# Patient Record
Sex: Female | Born: 2000 | Hispanic: Yes | Marital: Single | Smoking: Never smoker
Health system: Southern US, Community
[De-identification: ages and names within clinical notes are randomized; demographics above are authoritative.]

## PROBLEM LIST (undated history)

## (undated) DIAGNOSIS — F32A Depression, unspecified: Secondary | ICD-10-CM

## (undated) DIAGNOSIS — K602 Anal fissure, unspecified: Secondary | ICD-10-CM

## (undated) DIAGNOSIS — F419 Anxiety disorder, unspecified: Secondary | ICD-10-CM

## (undated) DIAGNOSIS — J45909 Unspecified asthma, uncomplicated: Secondary | ICD-10-CM

## (undated) DIAGNOSIS — F319 Bipolar disorder, unspecified: Secondary | ICD-10-CM

## (undated) HISTORY — DX: Anxiety disorder, unspecified: F41.9

## (undated) HISTORY — DX: Depression, unspecified: F32.A

## (undated) HISTORY — DX: Bipolar disorder, unspecified: F31.9

## (undated) HISTORY — DX: Anal fissure, unspecified: K60.2

## (undated) HISTORY — PX: COLONOSCOPY: SHX174

---

## 2018-06-20 ENCOUNTER — Other Ambulatory Visit: Payer: Self-pay

## 2018-06-20 ENCOUNTER — Emergency Department (HOSPITAL_COMMUNITY)
Admission: EM | Admit: 2018-06-20 | Discharge: 2018-06-20 | Disposition: A | Discharge status: Home / Self Care | Payer: Self-pay | Attending: Emergency Medicine | Admitting: Emergency Medicine

## 2018-06-20 ENCOUNTER — Encounter (HOSPITAL_COMMUNITY): Payer: Self-pay | Admitting: Obstetrics and Gynecology

## 2018-06-20 DIAGNOSIS — N6324 Unspecified lump in the left breast, lower inner quadrant: Secondary | ICD-10-CM | POA: Insufficient documentation

## 2018-06-20 DIAGNOSIS — N63 Unspecified lump in unspecified breast: Secondary | ICD-10-CM

## 2018-06-20 HISTORY — DX: Unspecified asthma, uncomplicated: J45.909

## 2018-06-20 NOTE — Care Management Note (Signed)
Case Management Note  CM consulted for no pcp with need for breast imaging follow up.  CM noted pt has [employer] ([employer]).  [facility] who advised [facility] provider would be acceptable but the order will need to come from them.  CM contacted pt via phone to discuss PCP needs.  She reports she will contact the student health center on campus and have them guide her care.  She had no further questions.  Updated Dr. Anitra Lauth.  No further CM needs noted at this time.  Kathia Covington, Lynnae Sandhoff, RN 06/20/2018, 3:44 PM

## 2018-06-20 NOTE — Discharge Instructions (Signed)
You will need to call the breast center to schedule a ultrasound to better characterize the lump noted on your breast exam.  This may very likely be a breast cyst, as these are very common in young females and can change size with your menstrual cycle.  You may use Tylenol or ibuprofen as needed for pain or warm compresses.  The area does not appear infected today but if her breast becomes red, warm to the touch swollen or you have any nipple drainage or discharge, or fevers please return to the emergency department for reevaluation.

## 2018-06-20 NOTE — ED Provider Notes (Signed)
[facility]-EMERGENCY DEPT Provider Note   CSN: [id number] Arrival date & time: 06/20/18  1409     History   Chief Complaint Chief Complaint  Patient presents with  . Breast Mass    HPI Adriana Jackson is a 17 y.o. female.  Adriana Jackson is a 17 y.o. Female who is otherwise healthy presents to the ED for evaluation of lump in the left breast. Pt reports she noticed this yesterday, [facility] and they recommended breast ultrasound,so pt presents for further evaluation.  She reports she noticed a small beadlike lump just to the left of her nipple, it is sore and somewhat painful to the touch.  She has not noticed any redness warmth or swelling of the breast in general.  No nipple discharge or drainage.  She is not currently pregnant or breast-feeding.  No fevers or chills.  She has not noticed any lumps or bumps elsewhere.  No history of similar.  Is not currently on her menstrual cycle.  She has not taken anything for symptoms prior to arrival, no other aggravating or alleviating factors.     Past Medical History:  Diagnosis Date  . Asthma     There are no active problems to display for this patient.   History reviewed. No pertinent surgical history.   OB History   None      Home Medications    Prior to Admission medications   Not on File    Family History No family history on file.  Social History Social History   Tobacco Use  . Smoking status: Never Smoker  . Smokeless tobacco: Never Used  Substance Use Topics  . Alcohol use: Not Currently  . Drug use: Not Currently     Allergies   Penicillins   Review of Systems Review of Systems  Constitutional: Negative for chills and fever.  Respiratory: Negative for shortness of breath.   Cardiovascular: Negative for chest pain.  Gastrointestinal: Negative for nausea and vomiting.  Skin: Negative for color change, pallor and rash.       Breast lump     Physical  Exam Updated Vital Signs BP (!) 130/96 (BP Location: Left Arm)   Pulse 92   Temp 97.8 F (36.6 C) (Oral)   Resp 16   Ht 5\' 8"  (1.727 m)   Wt 49.3 kg   LMP 06/09/2018   SpO2 100%   BMI 16.51 kg/m   Physical Exam  Constitutional: She appears well-developed and well-nourished. No distress.  HENT:  Head: Normocephalic and atraumatic.  Eyes: Right eye exhibits no discharge. Left eye exhibits no discharge.  Cardiovascular: Normal rate, regular rhythm, normal heart sounds and intact distal pulses.  Pulmonary/Chest: Effort normal and breath sounds normal. No respiratory distress.  Palpable lump noted in the left breast as noted below, no palpable masses, noted in the right breast.  No overlying skin changes.    Neurological: She is alert. Coordination normal.  Skin: She is not diaphoretic.  Psychiatric: She has a normal mood and affect. Her behavior is normal.  Nursing note and vitals reviewed.    ED Treatments / Results  Labs (all labs ordered are listed, but only abnormal results are displayed) Labs Reviewed - No data to display  EKG None  Radiology No results found.  Procedures Procedures (including critical care time)  Medications Ordered in ED Medications - No data to display   Initial Impression / Assessment and Plan / ED Course  I  have reviewed the triage vital signs and the nursing notes.  Pertinent labs & imaging results that were available during my care of the patient were reviewed by me and considered in my medical decision making (see chart for details).  Patient presents to the ED for evaluation of lump she noted in her left breast.  She was seen by student [facility] center who recommended ultrasound.  On exam patient does have a small cystic-like structure noted just to the left of the left nipple, no overlying skin changes.  Exam is noted all consistent with breast abscess.  Patient is not currently breast-feeding.  Patient will be referred to the breast  imaging center for ultrasound.  Discussed appropriate symptomatic management.  Patient expresses understanding and is in agreement with plan.  Stable for discharge home at this time.  Final Clinical Impressions(s) / ED Diagnoses   Final diagnoses:  Breast lump in female    ED Discharge Orders         Ordered    Ambulatory referral to Breast Clinic     06/20/18 1532           Legrand Rams 06/20/18 1619    Gwyneth Sprout, MD 06/20/18 2128

## 2018-06-20 NOTE — ED Triage Notes (Signed)
Pt reports she has an area of concern in her left breast that her student health center advised her to get ultrasounded.

## 2018-06-21 ENCOUNTER — Ambulatory Visit (INDEPENDENT_AMBULATORY_CARE_PROVIDER_SITE_OTHER): Payer: Self-pay | Admitting: Family Medicine

## 2018-06-21 ENCOUNTER — Encounter (INDEPENDENT_AMBULATORY_CARE_PROVIDER_SITE_OTHER): Payer: Self-pay | Admitting: Family Medicine

## 2018-06-21 VITALS — BP 117/79 | HR 89 | Temp 98.4°F | Ht 68.0 in | Wt 108.6 lb

## 2018-06-21 DIAGNOSIS — N6320 Unspecified lump in the left breast, unspecified quadrant: Secondary | ICD-10-CM

## 2018-06-21 DIAGNOSIS — G43709 Chronic migraine without aura, not intractable, without status migrainosus: Secondary | ICD-10-CM

## 2018-06-21 DIAGNOSIS — Z0000 Encounter for general adult medical examination without abnormal findings: Secondary | ICD-10-CM

## 2018-06-21 NOTE — Progress Notes (Signed)
Office Visit Note   Patient: Adriana Jackson           Date of Birth: [birth year]           MRN: [id number] Visit Date: 06/21/2018 Requested by: No referring provider defined for this encounter. PCP: Patient, No Pcp Per  Subjective: Chief Complaint  Patient presents with  . Annual Exam    HPI: She is here for a wellness exam.  She has not been to a doctor for a while.  Her main reason for the visit is worsening migraines.  Starting around age 17 she began having migraine headaches.  She has a strong family history of migraines.  She did not seek medical treatment for them, when they.  She took over-the-counter medication with good results.  In the last year she has had them 5-6 times per month.  They do not seem to be associated with any foods but they do get worse with weather changes.  No definite hormonal associations.  She tried somebody's triptan medication with good results.  Also in the past week she has had a left breast lump.  She went to the ER yesterday and they told her it did not look infected but ordered an ultrasound.  Apparently her insurance requires a PCP to order this.  Otherwise she has been in good health.               ROS: Energy level slightly diminished.  She gets gastrointestinal cramping and diarrhea when she consumes dairy products.  She has chronic troubles gaining weight.  No rashes on her skin, no hair or nail changes.  Menstrual cycles have been normal.  All other systems were negative.   Objective: Vital Signs: BP 117/79   Pulse 89   Temp 98.4 F (36.9 C)   Ht 5\' 8"  (1.727 m)   Wt 108 lb 9.6 oz (49.3 kg)   LMP 06/09/2018   SpO2 98%   BMI 16.51 kg/m   Physical Exam:  HEENT:  [place]/AT, PERRLA, EOM Full, no nystagmus.  Funduscopic examination within normal limits.  No conjunctival erythema.  Tympanic membranes are pearly gray with normal landmarks.  External ear canals are normal.  Nasal passages are clear.  Oropharynx is clear.  No significant  lymphadenopathy.  Thyroid slightly enlarged with no nodules.  2+ carotid pulses without bruits.  CV: Regular rate and rhythm without murmurs, rubs, or gallops.  No peripheral edema.  2+ radial and posterior tibial pulses.  Lungs: Clear to auscultation throughout with no wheezing or areas of consolidation.  Abd: Bowel sounds are active, no hepatosplenomegaly or masses.  Soft and nontender.    EXT: 2+ DTRs, no nail deformities.  SKIN: No suspicious lesions.  Breasts:  Deferred.    Imaging: None today.  Assessment & Plan: 1.  Wellness examination -We will draw some baseline labs today.  2.  Chronic headaches, probably migraines -Labs to evaluate.  If unremarkable, we will try some supplements and dietary changes for maintenance.  3.  Left breast lump -Ultrasound ordered.   Follow-Up Instructions: No follow-ups on file.       Procedures: None today.   PMFS History: There are no active problems to display for this patient.  Past Medical History:  Diagnosis Date  . Asthma     History reviewed. No pertinent family history.  History reviewed. No pertinent surgical history. Social History   Occupational History  . Not on file  Tobacco Use  . Smoking status: Never  Smoker  . Smokeless tobacco: Never Used  Substance and Sexual Activity  . Alcohol use: Not Currently  . Drug use: Not Currently  . Sexual activity: Not Currently

## 2018-06-22 ENCOUNTER — Telehealth (INDEPENDENT_AMBULATORY_CARE_PROVIDER_SITE_OTHER): Payer: Self-pay | Admitting: Family Medicine

## 2018-06-22 LAB — VITAMIN D 25 HYDROXY (VIT D DEFICIENCY, FRACTURES): Vit D, 25-Hydroxy: 35 ng/mL (ref 30–100)

## 2018-06-22 LAB — CBC WITH DIFFERENTIAL/PLATELET
BASOS PCT: 0.9 %
Basophils Absolute: 104 cells/uL (ref 0–200)
EOS ABS: 336 {cells}/uL (ref 15–500)
Eosinophils Relative: 2.9 %
HCT: 39.4 % (ref 35.0–45.0)
Hemoglobin: 13.5 g/dL (ref 11.7–15.5)
Lymphs Abs: 3051 cells/uL (ref 850–3900)
MCH: 29.9 pg (ref 27.0–33.0)
MCHC: 34.3 g/dL (ref 32.0–36.0)
MCV: 87.4 fL (ref 80.0–100.0)
MONOS PCT: 8.3 %
MPV: 11.8 fL (ref 7.5–12.5)
NEUTROS PCT: 61.6 %
Neutro Abs: 7146 cells/uL (ref 1500–7800)
PLATELETS: 340 10*3/uL (ref 140–400)
RBC: 4.51 10*6/uL (ref 3.80–5.10)
RDW: 12.2 % (ref 11.0–15.0)
TOTAL LYMPHOCYTE: 26.3 %
WBC: 11.6 10*3/uL — AB (ref 3.8–10.8)
WBCMIX: 963 {cells}/uL — AB (ref 200–950)

## 2018-06-22 LAB — LIPID PANEL
CHOLESTEROL: 154 mg/dL (ref <170)
HDL: 50 mg/dL (ref >45)
LDL CHOLESTEROL (CALC): 92 mg/dL (ref <110)
Non-HDL Cholesterol (Calc): 104 mg/dL (calc) (ref <120)
Total CHOL/HDL Ratio: 3.1 (calc) (ref <5.0)
Triglycerides: 42 mg/dL (ref <90)

## 2018-06-22 LAB — COMPREHENSIVE METABOLIC PANEL
AG RATIO: 1.7 (calc) (ref 1.0–2.5)
ALT: 9 U/L (ref 5–32)
AST: 16 U/L (ref 12–32)
Albumin: 5 g/dL (ref 3.6–5.1)
Alkaline phosphatase (APISO): 65 U/L (ref 47–176)
BUN: 11 mg/dL (ref 7–20)
CALCIUM: 10.4 mg/dL (ref 8.9–10.4)
CO2: 27 mmol/L (ref 20–32)
Chloride: 105 mmol/L (ref 98–110)
Creat: 0.67 mg/dL (ref 0.50–1.00)
GLOBULIN: 3 g/dL (ref 2.0–3.8)
GLUCOSE: 68 mg/dL (ref 65–99)
Potassium: 5.5 mmol/L — ABNORMAL HIGH (ref 3.8–5.1)
Sodium: 144 mmol/L (ref 135–146)
Total Bilirubin: 0.6 mg/dL (ref 0.2–1.1)
Total Protein: 8 g/dL (ref 6.3–8.2)

## 2018-06-22 LAB — THYROID PANEL WITH TSH
FREE THYROXINE INDEX: 2.6 (ref 1.4–3.8)
T3 Uptake: 31 % (ref 22–35)
T4, Total: 8.5 ug/dL (ref 5.3–11.7)
TSH: 0.95 mIU/L

## 2018-06-22 LAB — HIGH SENSITIVITY CRP: hs-CRP: <0.3 mg/L

## 2018-06-22 NOTE — Telephone Encounter (Signed)
Labs look good overall.  WBC is slightly elevated, possibly due to breast issue.  Vitamin D is low-normal (ideal is 50-80).  For headache prevention, I recommend trying the following:  Magnesium 400 mg twice daily Coenzyme Q10 100 mg twice daily Riboflavin 100 mg daily Fish Oil 1000 mg of combined DHA/EPA daily Vitamin C 1000 mg daily Vitamin D 2000 IU daily  The above regimen often helps with headache control.  If things don't improve after a month or two, we might try a food elimination diet such as Whole 30 (see their website).  [facility]

## 2018-06-25 ENCOUNTER — Encounter (INDEPENDENT_AMBULATORY_CARE_PROVIDER_SITE_OTHER): Payer: Self-pay | Admitting: Family Medicine

## 2018-06-26 ENCOUNTER — Encounter (INDEPENDENT_AMBULATORY_CARE_PROVIDER_SITE_OTHER): Payer: Self-pay | Admitting: Family Medicine

## 2018-07-10 ENCOUNTER — Other Ambulatory Visit: Payer: Self-pay

## 2018-11-21 ENCOUNTER — Encounter (HOSPITAL_COMMUNITY): Payer: Self-pay

## 2018-11-21 ENCOUNTER — Ambulatory Visit (HOSPITAL_COMMUNITY)
Admission: EM | Admit: 2018-11-21 | Discharge: 2018-11-21 | Disposition: A | Discharge status: Home / Self Care | Payer: BLUE CROSS/BLUE SHIELD | Attending: Family Medicine | Admitting: Family Medicine

## 2018-11-21 DIAGNOSIS — J019 Acute sinusitis, unspecified: Secondary | ICD-10-CM | POA: Diagnosis not present

## 2018-11-21 MED ORDER — CETIRIZINE HCL 10 MG PO CAPS: 10.0000 mg | ORAL_CAPSULE | Freq: Every day | ORAL | 0 refills | Status: DC

## 2018-11-21 MED ORDER — DOXYCYCLINE HYCLATE 100 MG PO CAPS: 100.0000 mg | ORAL_CAPSULE | Freq: Two times a day (BID) | ORAL | 0 refills | Status: AC

## 2018-11-21 MED ORDER — BENZONATATE 200 MG PO CAPS: 200.0000 mg | ORAL_CAPSULE | Freq: Three times a day (TID) | ORAL | 0 refills | Status: AC | PRN

## 2018-11-21 MED ORDER — FLUTICASONE PROPIONATE 50 MCG/ACT NA SUSP: 1.0000 | Freq: Every day | NASAL | 0 refills | Status: DC

## 2018-11-21 NOTE — Discharge Instructions (Signed)
Begin doxycycline twice daily for the next 10 days to treat for sinus infection as well as some infection in lungs Daily cetirizine and Flonase nasal spray to help with congestion and drainage Tessalon as needed every 8 hours for cough Tylenol and ibuprofen for headaches Rest, drink plenty of fluids  Please follow-up if symptoms not resolving, worsening, developing persistent fevers, persistent or changing headaches, difficulty breathing, chest discomfort

## 2018-11-21 NOTE — ED Provider Notes (Signed)
[facility]    CSN: [id number] Arrival date & time: 11/21/18  1412     History   Chief Complaint Chief Complaint  Patient presents with  . Generalized Body Aches  . Cough  . Nasal Congestion    HPI Adriana Jackson is a 18 y.o. female history of asthma presenting today for evaluation of URI symptoms.  Patient states that she has had cough, congestion as well as body aches.  Symptoms have been going on for approximately 2 weeks.  She notes that she has had a slight fever up to 99.3.  She has also had headaches off and on.  She is taking Tylenol, ibuprofen as well as over-the-counter cough syrup without relief.  Denies nausea vomiting or diarrhea.  HPI  Past Medical History:  Diagnosis Date  . Asthma     There are no active problems to display for this patient.   History reviewed. No pertinent surgical history.  OB History   No obstetric history on file.      Home Medications    Prior to Admission medications   Medication Sig Start Date End Date Taking? Authorizing Provider  benzonatate (TESSALON) 200 MG capsule Take 1 capsule (200 mg total) by mouth 3 (three) times daily as needed for up to 7 days for cough. 11/21/18 11/28/18  Karesha Trzcinski C, PA-C  Cetirizine HCl 10 MG CAPS Take 1 capsule (10 mg total) by mouth daily for 10 days. 11/21/18 12/01/18  Lam Bjorklund C, PA-C  doxycycline (VIBRAMYCIN) 100 MG capsule Take 1 capsule (100 mg total) by mouth 2 (two) times daily for 10 days. 11/21/18 12/01/18  Mailin Coglianese C, PA-C  fluticasone (FLONASE) 50 MCG/ACT nasal spray Place 1-2 sprays into both nostrils daily for 7 days. 11/21/18 11/28/18  Colleen Donahoe, Junius Creamer, PA-C    Family History History reviewed. No pertinent family history.  Social History Social History   Tobacco Use  . Smoking status: Never Smoker  . Smokeless tobacco: Never Used  Substance Use Topics  . Alcohol use: Not Currently  . Drug use: Not Currently     Allergies   Penicillins   Review of  Systems Review of Systems  Constitutional: Positive for fatigue and fever. Negative for activity change, appetite change and chills.  HENT: Positive for congestion and rhinorrhea. Negative for ear pain, sinus pressure, sore throat and trouble swallowing.   Eyes: Negative for discharge and redness.  Respiratory: Positive for cough. Negative for chest tightness and shortness of breath.   Cardiovascular: Negative for chest pain.  Gastrointestinal: Negative for abdominal pain, diarrhea, nausea and vomiting.  Musculoskeletal: Negative for myalgias.  Skin: Negative for rash.  Neurological: Positive for headaches. Negative for dizziness and light-headedness.     Physical Exam Triage Vital Signs ED Triage Vitals  Enc Vitals Group     BP 11/21/18 1520 115/78     Pulse Rate 11/21/18 1520 90     Resp 11/21/18 1520 16     Temp 11/21/18 1520 98.9 F (37.2 C)     Temp Source 11/21/18 1520 Oral     SpO2 11/21/18 1520 97 %     Weight --      Height --      Head Circumference --      Peak Flow --      Pain Score 11/21/18 1524 6     Pain Loc --      Pain Edu? --      Excl. in GC? --  No data found.  Updated Vital Signs BP 115/78 (BP Location: Right Arm)   Pulse 90   Temp 98.9 F (37.2 C) (Oral)   Resp 16   LMP 11/15/2018   SpO2 97%   Visual Acuity Right Eye Distance:   Left Eye Distance:   Bilateral Distance:    Right Eye Near:   Left Eye Near:    Bilateral Near:     Physical Exam Vitals signs and nursing note reviewed.  Constitutional:      General: She is not in acute distress.    Appearance: She is well-developed.  HENT:     Head: Normocephalic and atraumatic.     Ears:     Comments: Bilateral ears without tenderness to palpation of external auricle, tragus and mastoid, EAC's without erythema or swelling, TM's with good bony landmarks and cone of light. Non erythematous.     Nose:     Comments: Bilateral turbinates swollen, with minimal erythema, mild amount of  rhinorrhea present    Mouth/Throat:     Comments: Oral mucosa pink and moist, no tonsillar enlargement or exudate. Posterior pharynx patent and nonerythematous, no uvula deviation or swelling. Normal phonation. Eyes:     Conjunctiva/sclera: Conjunctivae normal.  Neck:     Musculoskeletal: Neck supple.  Cardiovascular:     Rate and Rhythm: Normal rate and regular rhythm.     Heart sounds: No murmur.  Pulmonary:     Effort: Pulmonary effort is normal. No respiratory distress.     Breath sounds: Normal breath sounds.     Comments: Breathing comfortably at rest, CTABL, no wheezing, rales or other adventitious sounds auscultated Abdominal:     Palpations: Abdomen is soft.     Tenderness: There is no abdominal tenderness.  Skin:    General: Skin is warm and dry.  Neurological:     General: No focal deficit present.     Mental Status: She is alert and oriented to person, place, and time. Mental status is at baseline.      UC Treatments / Results  Labs (all labs ordered are listed, but only abnormal results are displayed) Labs Reviewed - No data to display  EKG None  Radiology No results found.  Procedures Procedures (including critical care time)  Medications Ordered in UC Medications - No data to display  Initial Impression / Assessment and Plan / UC Course  I have reviewed the triage vital signs and the nursing notes.  Pertinent labs & imaging results that were available during my care of the patient were reviewed by me and considered in my medical decision making (see chart for details).     URI symptoms with cough x2 weeks.  We will go ahead and treat for sinusitis, penicillin allergy, will treat with doxycycline as well as to cover atypicals in the lungs as well.  Further symptomatic management recommendations discussed as well to further manage congestion and cough.  Tylenol and ibuprofen.  Push fluids.  Monitor for resolution of headaches as well.Discussed strict  return precautions. Patient verbalized understanding and is agreeable with plan.  Final Clinical Impressions(s) / UC Diagnoses   Final diagnoses:  Acute sinusitis with symptoms > 10 days     Discharge Instructions     Begin doxycycline twice daily for the next 10 days to treat for sinus infection as well as some infection in lungs Daily cetirizine and Flonase nasal spray to help with congestion and drainage Tessalon as needed every 8 hours for cough Tylenol  and ibuprofen for headaches Rest, drink plenty of fluids  Please follow-up if symptoms not resolving, worsening, developing persistent fevers, persistent or changing headaches, difficulty breathing, chest discomfort   ED Prescriptions    Medication Sig Dispense Auth. Provider   doxycycline (VIBRAMYCIN) 100 MG capsule Take 1 capsule (100 mg total) by mouth 2 (two) times daily for 10 days. 20 capsule Maylene Crocker C, PA-C   Cetirizine HCl 10 MG CAPS Take 1 capsule (10 mg total) by mouth daily for 10 days. 10 capsule Dianca Owensby C, PA-C   fluticasone (FLONASE) 50 MCG/ACT nasal spray Place 1-2 sprays into both nostrils daily for 7 days. 1 g Maziah Smola C, PA-C   benzonatate (TESSALON) 200 MG capsule Take 1 capsule (200 mg total) by mouth 3 (three) times daily as needed for up to 7 days for cough. 28 capsule Temari Schooler C, PA-C     Controlled Substance Prescriptions [place] Controlled Substance Registry consulted? Not Applicable   Lew Dawes, [place] 11/21/18 1547

## 2018-11-21 NOTE — ED Triage Notes (Signed)
Pt present coughing, body aches and nasal congestion. Symptoms started on 11/11/18. Pt states she was recently tested for the flu  [facility] the [facility] but the result were negative.  Pt has tried otc medication with no relief.

## 2018-12-19 ENCOUNTER — Telehealth (INDEPENDENT_AMBULATORY_CARE_PROVIDER_SITE_OTHER): Payer: Self-pay

## 2018-12-19 ENCOUNTER — Encounter (INDEPENDENT_AMBULATORY_CARE_PROVIDER_SITE_OTHER): Payer: Self-pay

## 2018-12-19 NOTE — Telephone Encounter (Signed)
Left message on the patient's voice mail to give [place] a call back to change tomorrow's appointment with Dr. Prince Rome to an earlier time or cancel/reschedule, due to the change in our operating hours.

## 2018-12-20 ENCOUNTER — Ambulatory Visit (INDEPENDENT_AMBULATORY_CARE_PROVIDER_SITE_OTHER): Payer: Self-pay | Admitting: Family Medicine

## 2019-04-21 ENCOUNTER — Telehealth: Payer: BLUE CROSS/BLUE SHIELD | Admitting: Family

## 2019-04-21 DIAGNOSIS — J4520 Mild intermittent asthma, uncomplicated: Secondary | ICD-10-CM | POA: Diagnosis not present

## 2019-04-21 MED ORDER — PREDNISONE 20 MG PO TABS: 40.0000 mg | ORAL_TABLET | Freq: Every day | ORAL | 0 refills | Status: DC

## 2019-04-21 MED ORDER — ALBUTEROL SULFATE HFA 108 (90 BASE) MCG/ACT IN AERS: 2.0000 | INHALATION_SPRAY | Freq: Four times a day (QID) | RESPIRATORY_TRACT | 0 refills | Status: DC | PRN

## 2019-04-21 NOTE — Addendum Note (Signed)
Addended by: Dutch Quint B on: 04/21/2019 12:59 PM   Modules accepted: Orders

## 2019-04-21 NOTE — Progress Notes (Signed)
E Visit for Asthma  Based on what you have shared with me, it looks like you may have a flare up of your asthma.  Asthma is a chronic (ongoing) lung disease which results in airway obstruction, inflammation and hyper-responsiveness.   Asthma symptoms vary from person to person, with common symptoms including nighttime awakening and decreased ability to participate in normal activities as a result of shortness of breath. It is often triggered by changes in weather, changes in the season, changes in air temperature, or inside (home, school, daycare or work) allergens such as animal dander, mold, mildew, woodstoves or cockroaches.   It can also be triggered by hormonal changes, extreme emotion, physical exertion or an upper respiratory tract illness.     It is important to identify the trigger, and then eliminate or avoid the trigger if possible.   If you have been prescribed medications to be taken on a regular basis, it is important to follow the asthma action plan and to follow guidelines to adjust medication in response to increasing symptoms of decreased peak expiratory flow rate  Treatment: I have prescribed: Prednisone 40mg by mouth per day for 5 - 7 days  HOME CARE . Only take medications as instructed by your medical team. . Consider wearing a mask or scarf to improve breathing air temperature have been shown to decrease irritation and decrease exacerbations . Get rest. . Taking a steamy shower or using a humidifier may help nasal congestion sand ease sore throat pain. You can place a towel over your head and breathe in the steam from hot water coming from a faucet. . Using a saline nasal spray works much the same way.  . Cough drops, hare candies and sore throat lozenges may ease your cough.  . Avoid close contacts especially the very you and the elderly . Cover your mouth if  you cough or sneeze . Always remember to wash your hands.    GET HELP RIGHT AWAY IF: . You develop worsening symptoms; breathlessness at rest, drowsy, confused or agitated, unable to speak in full sentences . You have coughing fits . You develop a severe headache or visual changes . You develop shortness of breath, difficulty breathing or start having chest pain . Your symptoms persist after you have completed your treatment plan . If your symptoms do not improve within 10 days  MAKE SURE YOU . Understand these instructions. . Will watch your condition. . Will get help right away if you are not doing well or get worse.   Your e-visit answers were reviewed by a board certified advanced clinical practitioner to complete your personal care plan, Depending upon the condition, your plan could have included both over the counter or prescription medications.  Please review your pharmacy choice. Your safety is important to us. If you have drug allergies check your prescription carefully. You can use MyChart to ask questions about today's visit, request a non-urgent call back, or ask for a work or school excuse for 24 hours related to this e-Visit. If it has been greater than 24 hours you will need to follow up with your provider, or enter a new e-Visit to address those concerns.  You will get an e-mail in the next two days asking about your experience. I hope that your e-visit has been valuable and will speed your recovery. Thank you for using e-visits.  Greater than 5 minutes, yet less than 10 minutes of time have been spent researching, coordinating, and implementing care for   this patient today.  Thank you for the details you included in the comment boxes. Those details are very helpful in determining the best course of treatment for you and help us to provide the best care.  

## 2019-05-25 ENCOUNTER — Encounter (INDEPENDENT_AMBULATORY_CARE_PROVIDER_SITE_OTHER): Payer: Self-pay | Admitting: Family Medicine

## 2019-06-03 ENCOUNTER — Ambulatory Visit (INDEPENDENT_AMBULATORY_CARE_PROVIDER_SITE_OTHER): Payer: BLUE CROSS/BLUE SHIELD | Admitting: Family Medicine

## 2019-06-03 ENCOUNTER — Encounter: Payer: Self-pay | Admitting: Family Medicine

## 2019-06-03 ENCOUNTER — Other Ambulatory Visit: Payer: Self-pay

## 2019-06-03 DIAGNOSIS — M25561 Pain in right knee: Secondary | ICD-10-CM | POA: Diagnosis not present

## 2019-06-03 DIAGNOSIS — M25562 Pain in left knee: Secondary | ICD-10-CM | POA: Diagnosis not present

## 2019-06-03 MED ORDER — DICLOFENAC SODIUM 75 MG PO TBEC: 75.0000 mg | DELAYED_RELEASE_TABLET | Freq: Two times a day (BID) | ORAL | 1 refills | Status: DC

## 2019-06-03 MED ORDER — GLUCOSAMINE SULFATE 1000 MG PO CAPS: 1.0000 | ORAL_CAPSULE | Freq: Two times a day (BID) | ORAL | 1 refills | Status: DC

## 2019-06-03 NOTE — Progress Notes (Signed)
I saw and examined the patient with Dr. Mayer Masker and agree with assessment and plan as outlined.  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  Has wide Q-angles related to femoral anteversion and hyperpronation.  No crepitus.  Will try PT, glucosamine, arch supports, diclofenac.  Possibly PSO brace.  X-Rays, MRI if worsens.

## 2019-06-03 NOTE — Progress Notes (Signed)
Adriana Jackson - 19 y.o. female MRN [id number]  Date of birth: 03-07-2001  Office Visit Note: Visit Date: 06/03/2019 PCP: Patient, No Pcp Per Referred by: No ref. provider found  Subjective: Chief Complaint  Patient presents with  . Pain bil knees x 2 weeks  . cracking, locking in straight position - NKI   HPI: Adriana Jackson is a 18 y.o. female who comes in today with two weeks of bilateral knee pain.  She reports that her knees have been popping for a long time but have not caused significant pain until two weeks ago. She reports anterior knee pain, worse with walking and stairs. Reports pain 8/10. Ibuprofen helps some. No acute injury, inciting event that she can recall. She walks for exercise. No swelling noted, no locking/catching, or knee giving out.    ROS Otherwise per HPI.  Assessment & Plan: Visit Diagnoses:  1. Acute pain of both knees   Suspect patellofemoral pain secondary to widened Q angle, femoral anteversion.   Plan:  - PT referral - will trial diclofenac for pain  Meds & Orders:  Meds ordered this encounter  Medications  . diclofenac (VOLTAREN) 75 MG EC tablet    Sig: Take 1 tablet (75 mg total) by mouth 2 (two) times daily.    Dispense:  60 tablet    Refill:  1  . Glucosamine Sulfate 1000 MG CAPS    Sig: Take 1 capsule (1,000 mg total) by mouth 2 (two) times daily.    Dispense:  180 capsule    Refill:  1   No orders of the defined types were placed in this encounter.   Follow-up: No follow-ups on file.   Procedures: No procedures performed  No notes on file   Clinical History: No specialty comments available.   She reports that she has never smoked. She has never used smokeless tobacco. No results for input(s): HGBA1C, LABURIC in the last 8760 hours.  Objective:  VS:  HT:    WT:   BMI:     BP:   HR: bpm  TEMP: ( )  RESP:  Physical Exam  PHYSICAL EXAM: Gen: NAD, alert, cooperative with exam, well-appearing HEENT: clear conjunctiva,  CV:   no edema, capillary refill brisk, normal rate Resp: non-labored Skin: no rashes, normal turgor  Neuro: no gross deficits.  Psych:  alert and oriented  Ortho Exam  Left Knee: - Inspection: no gross deformity. No swelling/effusion, erythema or bruising. Skin intact. Widened Q angle. Femoral anteversion noted. - Palpation: TTP around patella  - ROM: full active ROM with flexion and extension in knee and hip - Strength: 5/5 strength - Neuro/vasc: NV intact - Special Tests: - LIGAMENTS: negative anterior and posterior drawer, negative Lachman's, no MCL or LCL laxity  -- MENISCUS: positiveThessaly  -- PF JOINT: nml patellar mobility bilaterally.  negative patellar grind, negative patellar apprehension  Right Knee: - Inspection: no gross deformity. No swelling/effusion, erythema or bruising. Skin intact. Widened Q angle. Femoral anteversion noted. - Palpation: TTP around patella  - ROM: full active ROM with flexion and extension in knee and hip - Strength: 5/5 strength - Neuro/vasc: NV intact - Special Tests: - LIGAMENTS: negative anterior and posterior drawer, negative Lachman's, no MCL or LCL laxity  -- MENISCUS: positiveThessaly  -- PF JOINT: nml patellar mobility bilaterally.  negative patellar grind, negative patellar apprehension  Hips: normal ROM, negative FABER and FADIR bilaterally  Standing: valgus stress at knees, pronation of feet Stepping: knee instability noted bilaterally  with stepping up on stool  Imaging: No results found.  Past Medical/Family/Surgical/Social History: Medications & Allergies reviewed per EMR, new medications updated. There are no active problems to display for this patient.  Past Medical History:  Diagnosis Date  . Asthma    History reviewed. No pertinent family history. History reviewed. No pertinent surgical history. Social History   Occupational History  . Not on file  Tobacco Use  . Smoking status: Never Smoker  . Smokeless tobacco:  Never Used  Substance and Sexual Activity  . Alcohol use: Not Currently  . Drug use: Not Currently  . Sexual activity: Not Currently

## 2019-06-13 ENCOUNTER — Encounter: Payer: Self-pay | Admitting: Family Medicine

## 2019-06-16 ENCOUNTER — Encounter: Payer: Self-pay | Admitting: Family Medicine

## 2019-10-09 ENCOUNTER — Telehealth: Payer: BLUE CROSS/BLUE SHIELD | Admitting: Physician Assistant

## 2019-10-09 DIAGNOSIS — T753XXA Motion sickness, initial encounter: Secondary | ICD-10-CM

## 2019-10-09 MED ORDER — DIMENHYDRINATE 50 MG PO TABS: 50.0000 mg | ORAL_TABLET | Freq: Three times a day (TID) | ORAL | 0 refills | Status: DC | PRN

## 2019-10-09 NOTE — Progress Notes (Signed)
E Visit for Motion Sickness  We are sorry that you are not feeling well. Here is how we plan to help!  Based on what you have shared with me it looks like you have symptoms of motion sickness.  I have prescribed a medication that will help prevent or alleviate your symptoms:  Dimenhydrinate 50  every 8 hours as needed for nausea or dizziness. Be aware this medication can cause drowsiness and do not drive, drink alcohol, or operate heavy machinery while taking this medication.    Prevention:  You might feel better if you keep your eyes focused on outside while you are in motion. For example, if you are in a car, sit in the front and look in the direction you are moving; if you are on a boat, stay on the deck and look to the horizon. This helps make what you see match the movement you are feeling, and so you are less likely to feel sick.  You should also avoid reading, watching a movie, texting or reading messages, or looking at things close to you inside the vehicle you are riding in.  . Use the seat head rest. Lean your head against the back of the seat or head rest when traveling in vehicles with seats to minimize head movements.  . On a ship: When making your reservations, choose a cabin in the middle of the ship and near the waterline. When on board, go up on deck and focus on the horizon.  . In an airplane: Request a window seat and look out the window. A seat over the front edge of the wing is the most preferable spot (the degree of motion is the lowest here). Direct the air vent to blow cool air on your face.  . On a train: Always face forward and sit near a window.  . In a vehicle: Sit in the front seat; if you are the passenger, look at the scenery in the distance. For some people, driving the vehicle (rather than being a passenger) is an instant remedy.  . Avoid others who have become nauseous with motion sickness. Seeing and smelling others who have motion sickness may cause you  to become sick.  GET HELP RIGHT AWAY IF:   Your symptoms do not improve or worsen within 2 days after treatment.   You cannot keep down fluids after trying the medication.   Other associated symptoms such as severe headache, visual field changes, fever, or intractable nausea and vomiting.  MAKE SURE YOU:   Understand these instructions.  Will watch your condition.  Will get help right away if you are not doing well or get worse.  Thank you for choosing an e-visit.  Your e-visit answers were reviewed by a board certified advanced clinical practitioner to complete your personal care plan. Depending upon the condition, your plan could have included both over the counter or prescription medications.  Please review your pharmacy choice. Be sure that the pharmacy you have chosen is open so that you can pick up your prescription now.  If there is a problem you may message your provider in [place] to have the prescription routed to another pharmacy.  Your safety is important to [place]. If you have drug allergies check your prescription carefully.   For the next 24 hours, you can use MyChart to ask questions about today's visit, request a non-urgent call back, or ask for a work or school excuse from your e-visit provider.  You will get an  e-mail in the next two days asking about your experience. I hope that your e-visit has been valuable and will speed your recovery.   References or for more information: https://[email or web address]/ https://my.https://rowe.info/ https://www.uptodate.com   Greater than 5 minutes, yet less than 10 minutes of time have been spent researching, coordinating, and implementing care for this patient today.

## 2019-10-24 ENCOUNTER — Encounter (INDEPENDENT_AMBULATORY_CARE_PROVIDER_SITE_OTHER): Payer: Self-pay

## 2019-10-25 ENCOUNTER — Telehealth: Payer: Self-pay | Admitting: Family

## 2019-10-25 DIAGNOSIS — R079 Chest pain, unspecified: Secondary | ICD-10-CM

## 2019-10-25 DIAGNOSIS — R0602 Shortness of breath: Secondary | ICD-10-CM

## 2019-10-25 DIAGNOSIS — J4541 Moderate persistent asthma with (acute) exacerbation: Secondary | ICD-10-CM

## 2019-10-25 NOTE — Progress Notes (Signed)
Based on what you shared with me, I feel your condition warrants further evaluation and I recommend that you be seen for a face to face office visit.  Given your current symptoms of shortness of breath and chest pain with a history of being hospitalized for asthma you need to be seen face-to-face to be evaluated to rule out pneumonia.   NOTE: If you entered your credit card information for this eVisit, you will not be charged. You may see a "hold" on your card for the $35 but that hold will drop off and you will not have a charge processed.   If you are having a true medical emergency please call 911.      For an urgent face to face visit, [place] has five urgent care centers for your convenience:      NEW:  [facility] at [facility] Directions [phone] [address] [place], [place] [address] . 10 am - 6pm Monday - Friday    [facility]) Get Driving Directions [phone] [address] [place], [place] [address] . 10 am to 8 pm Monday-Friday . 12 pm to 8 pm [facility] Urgent Care at [facility] Get Driving Directions [phone] [address] [place], [place] [address] . 8 am to 8 pm Monday-Friday . 9 am to 6 pm Saturday . 11 am to 6 pm Sunday     [facility] at [facility] Get Driving Directions  [phone] [address] [place], [place] [address] . 8 am to 8 pm Monday-Friday . 8 am to 4 pm [facility] Urgent Care at [facility] Directions [phone] [address] [place], [place] [address] . 12 pm to 6 pm Monday-Friday      Your e-visit answers were reviewed by a board certified advanced clinical practitioner to complete your personal care plan.  Thank you for using e-Visits.

## 2019-11-06 ENCOUNTER — Ambulatory Visit: Payer: Medicaid Other | Admitting: Family Medicine

## 2019-11-06 ENCOUNTER — Encounter: Payer: Self-pay | Admitting: Family Medicine

## 2019-11-06 ENCOUNTER — Other Ambulatory Visit: Payer: Self-pay

## 2019-11-06 VITALS — BP 107/71 | HR 92

## 2019-11-06 DIAGNOSIS — R519 Headache, unspecified: Secondary | ICD-10-CM | POA: Diagnosis not present

## 2019-11-06 DIAGNOSIS — R5383 Other fatigue: Secondary | ICD-10-CM

## 2019-11-06 DIAGNOSIS — R42 Dizziness and giddiness: Secondary | ICD-10-CM

## 2019-11-06 DIAGNOSIS — G4452 New daily persistent headache (NDPH): Secondary | ICD-10-CM

## 2019-11-06 DIAGNOSIS — R110 Nausea: Secondary | ICD-10-CM | POA: Diagnosis not present

## 2019-11-06 MED ORDER — SUMATRIPTAN SUCCINATE 100 MG PO TABS: 50.0000 mg | ORAL_TABLET | ORAL | 6 refills | Status: DC | PRN

## 2019-11-06 NOTE — Progress Notes (Signed)
   Office Visit Note   Patient: Adriana Jackson           Date of Birth: 2000-10-19           MRN: [id number] Visit Date: 11/06/2019 Requested by: No referring provider defined for this encounter. PCP: Patient, No Pcp Per  Subjective: Chief Complaint  Patient presents with  . Dizziness    all symptoms x 1 week  . Nausea  . headache  . fatigue    HPI: She is here with worsening headaches, dizziness, nausea and fatigue.  I have discussed her chronic headaches at a previous visit.  She was having 5 or 6/month the last time we spoke.  But in the past week or so she has had daily pounding headaches associated with dizziness/lightheadedness when standing and walking, nausea and fatigue.  She has not had a fever, no symptoms of respiratory illness.  There has been no change in her diet or her medication regimen.  A friend of hers had some sort of prescription migraine medicine and gave her 1 to try but it did not help.  She is not sure which ones she was given.  She has eliminated dairy from her diet for the past several months but has not noticed too much difference in her headache pattern.               ROS: No unintentional weight change.  No bowel or bladder dysfunction.  No rash on her skin.  All other systems were reviewed and are negative.  Objective: Vital Signs: BP 107/71   Pulse 92   Physical Exam:  General:  Alert and oriented, in no acute distress. Pulm:  Breathing unlabored. Psy:  Normal mood, congruent affect. Skin: No visible rash. Head: TMs are pearly gray, funduscopic exam is within normal limits.  Pupils are equal and reactive to light and accommodation.  No nystagmus. Neck: No thyromegaly or nodules, 2+ carotid pulses with no bruits. CV: Regular rate and rhythm without murmurs, rubs, or gallops.  No peripheral edema.  2+ radial and posterior tibial pulses. Lungs: Clear to auscultation throughout with no wheezing or areas of consolidation.    Imaging: None today   Assessment & Plan: 1.  Chronic headaches with acute worsening, etiology uncertain. -We will try Imitrex.  I also ordered an MRI of the brain to further evaluate. -If symptoms do not improve, will refer to headache specialist.     Procedures: No procedures performed  No notes on file     PMFS History: There are no problems to display for this patient.  Past Medical History:  Diagnosis Date  . Asthma     History reviewed. No pertinent family history.  History reviewed. No pertinent surgical history. Social History   Occupational History  . Not on file  Tobacco Use  . Smoking status: Never Smoker  . Smokeless tobacco: Never Used  Substance and Sexual Activity  . Alcohol use: Not Currently  . Drug use: Not Currently  . Sexual activity: Not Currently

## 2019-11-10 ENCOUNTER — Encounter: Payer: Self-pay | Admitting: Family Medicine

## 2019-11-10 MED ORDER — METHYLPREDNISOLONE 4 MG PO TBPK: ORAL_TABLET | ORAL | 0 refills | Status: DC

## 2019-11-27 ENCOUNTER — Encounter: Payer: Self-pay | Admitting: Family Medicine

## 2019-12-04 ENCOUNTER — Encounter: Payer: Self-pay | Admitting: Family Medicine

## 2019-12-04 DIAGNOSIS — R5383 Other fatigue: Secondary | ICD-10-CM

## 2019-12-04 DIAGNOSIS — R110 Nausea: Secondary | ICD-10-CM

## 2019-12-04 DIAGNOSIS — R42 Dizziness and giddiness: Secondary | ICD-10-CM

## 2019-12-05 ENCOUNTER — Telehealth: Payer: Medicaid Other | Admitting: Nurse Practitioner

## 2019-12-05 DIAGNOSIS — H00012 Hordeolum externum right lower eyelid: Secondary | ICD-10-CM | POA: Diagnosis not present

## 2019-12-05 NOTE — Progress Notes (Signed)
We are sorry that you are not feeling well. Here is how we plan to help!  Based on what you have shared with me it looks like you have a stye.  A stye is an inflammation of the eyelid.  It is often a red, painful lump near the edge of the eyelid that may look like a boil or a pimple.  A stye develops when an infection occurs at the base of an eyelash.   We have made appropriate suggestions for you based upon your presentation: Simple styes can be treated without medical intervention.  Most styes either resolve spontaneously or resolve with simple home treatment by applying warm compresses or heated washcloth to the stye for about 10-15 minutes three to four times a day. This causes the stye to drain and resolve.  HOME CARE:   Wash your hands often!  Let the stye open on its own. Don't squeeze or open it.  Don't rub your eyes. This can irritate your eyes and let in bacteria.  If you need to touch your eyes, wash your hands first.  Don't wear eye makeup or contact lenses until the area has healed.  GET HELP RIGHT AWAY IF:   Your symptoms do not improve.  You develop blurred or loss of vision.  Your symptoms worsen (increased discharge, pain or redness).  Thank you for choosing an e-visit.  Your e-visit answers were reviewed by a board certified advanced clinical practitioner to complete your personal care plan.  Depending upon the condition, your plan could have included both over the counter or prescription medications.  Please review your pharmacy choice.  Make sure the pharmacy is open so you can pick up prescription now.  If there is a problem, you may contact your provider through MyChart messaging and have the prescription routed to another pharmacy.    Your safety is important to us.  If you have drug allergies check your prescription carefully.  For the next 24 hours you can use MyChart to ask questions about today's visit, request a non-urgent call back, or ask for a work or  school excuse.  You will get an email in the next two days asking about your experience.  I hope you that your e-visit has been valuable and will speed your recovery.   5-10 minutes spent reviewing and documenting in chart.  

## 2019-12-18 ENCOUNTER — Encounter: Payer: Self-pay | Admitting: Radiology

## 2019-12-19 ENCOUNTER — Telehealth: Payer: Medicaid Other | Admitting: Nurse Practitioner

## 2019-12-19 DIAGNOSIS — U071 COVID-19: Secondary | ICD-10-CM

## 2019-12-19 MED ORDER — ALBUTEROL SULFATE HFA 108 (90 BASE) MCG/ACT IN AERS: 2.0000 | INHALATION_SPRAY | Freq: Four times a day (QID) | RESPIRATORY_TRACT | 0 refills | Status: DC | PRN

## 2019-12-19 MED ORDER — PREDNISONE 10 MG (21) PO TBPK: ORAL_TABLET | ORAL | 0 refills | Status: DC

## 2019-12-19 MED ORDER — AZITHROMYCIN 250 MG PO TABS: ORAL_TABLET | ORAL | 0 refills | Status: DC

## 2019-12-19 NOTE — Progress Notes (Signed)
We are sorry you are not feeling well. We are here to help!  You have tested positive for COVID-19, meaning that you were infected with the novel coronavirus and could give the germ to others.    You have been enrolled in MyChart Home Monitoring for COVID-19. Daily you will receive a questionnaire within the MyChart website. Our COVID-19 response team will be monitoring your responses daily.  Please continue isolation at home, for at least 10 days since the start of your symptoms and until you have had 24 hours with no fever (without taking a fever reducer) and with improving of symptoms.  Please continue good preventive care measures, including:  frequent hand-washing, avoid touching your face, cover coughs/sneezes, stay out of crowds and keep a 6 foot distance from others.  Follow up with your provider or go to the nearest hospital ED for re-assessment if fever/cough/breathlessness return.  The following symptoms may appear 2-14 days after exposure: . Fever . Cough . Shortness of breath or difficulty breathing . Chills . Repeated shaking with chills . Muscle pain . Headache . Sore throat . New loss of taste or smell . Fatigue . Congestion or runny nose . Nausea or vomiting . Diarrhea  Go to the nearest hospital ED for assessment if fever/cough/breathlessness are severe or illness seems like a threat to life.  It is vitally important that if you feel that you have an infection such as this virus or any other virus that you stay home and away from places where you may spread it to others.  You should avoid contact with people age 60 and older.   You can use medication such as A prescription: - inhaler called Albuterol MDI 90 mcg /actuation 2 puffs every 4 hours as needed for shortness of breath, wheezing, cough - zpak as directed -prednisone 10mg  dose pack for 6 days Directions for 6 day taper: Day 1: 2 tablets before breakfast, 1 after both lunch & dinner and 2 at bedtime Day 2: 1 tab  before breakfast, 1 after both lunch & dinner and 2 at bedtime Day 3: 1 tab at each meal & 1 at bedtime Day 4: 1 tab at breakfast, 1 at lunch, 1 at bedtime Day 5: 1 tab at breakfast & 1 tab at bedtime Day 6: 1 tab at breakfast   You may also take acetaminophen (Tylenol) as needed for fever.  Reduce your risk of any infection by using the same precautions used for avoiding the common cold or flu:  Wash your hands often with soap and warm water for at least 20 seconds.  If soap and water are not readily available, use an alcohol-based hand sanitizer with at least 60% alcohol.  . If coughing or sneezing, cover your mouth and nose by coughing or sneezing into the elbow areas of your shirt or coat, into a tissue or into your sleeve (not your hands). . Avoid shaking hands with others and consider head nods or verbal greetings only. . Avoid touching your eyes, nose, or mouth with unwashed hands.  . Avoid close contact with people who are sick. . Avoid places or events with large numbers of people in one location, like concerts or sporting events. . Carefully consider travel plans you have or are making. . If you are planning any travel outside or inside the [place], visit the CDC's Travelers' Health webpage for the latest health notices. . If you have some symptoms but not all symptoms, continue to monitor at home  and seek medical attention if your symptoms worsen. . If you are having a medical emergency, call 911.  HOME CARE . Only take medications as instructed by your medical team. . Drink plenty of fluids and get plenty of rest. . A steam or ultrasonic humidifier can help if you have congestion.   GET HELP RIGHT AWAY IF YOU HAVE EMERGENCY WARNING SIGNS** FOR COVID-19. If you or someone is showing any of these signs seek emergency medical care immediately. Call 911 or proceed to your closest emergency facility if: . You develop worsening high fever. . Trouble breathing . Bluish lips or  face . Persistent pain or pressure in the chest . New confusion . Inability to wake or stay awake . You cough up blood. . Your symptoms become more severe  **This list is not all possible symptoms. Contact your medical provider for any symptoms that are sever or concerning to you.  MAKE SURE YOU   Understand these instructions.  Will watch your condition.  Will get help right away if you are not doing well or get worse.  Your e-visit answers were reviewed by a board certified advanced clinical practitioner to complete your personal care plan.  Depending on the condition, your plan could have included both over the counter or prescription medications.  If there is a problem please reply once you have received a response from your provider.  Your safety is important to [place].  If you have drug allergies check your prescription carefully.    You can use MyChart to ask questions about today's visit, request a non-urgent call back, or ask for a work or school excuse for 24 hours related to this e-Visit. If it has been greater than 24 hours you will need to follow up with your provider, or enter a new e-Visit to address those concerns. You will get an e-mail in the next two days asking about your experience.  I hope that your e-visit has been valuable and will speed your recovery. Thank you for using e-visits.   5-10 minutes spent reviewing and documenting in chart.

## 2019-12-23 ENCOUNTER — Other Ambulatory Visit: Payer: Self-pay

## 2019-12-23 DIAGNOSIS — R42 Dizziness and giddiness: Secondary | ICD-10-CM | POA: Insufficient documentation

## 2019-12-23 DIAGNOSIS — J45909 Unspecified asthma, uncomplicated: Secondary | ICD-10-CM | POA: Diagnosis not present

## 2019-12-23 DIAGNOSIS — U071 COVID-19: Secondary | ICD-10-CM | POA: Diagnosis not present

## 2019-12-23 DIAGNOSIS — R079 Chest pain, unspecified: Secondary | ICD-10-CM | POA: Diagnosis present

## 2019-12-24 ENCOUNTER — Encounter (HOSPITAL_COMMUNITY): Payer: Self-pay | Admitting: Emergency Medicine

## 2019-12-24 ENCOUNTER — Emergency Department (HOSPITAL_COMMUNITY)
Admission: EM | Admit: 2019-12-24 | Discharge: 2019-12-24 | Disposition: A | Discharge status: Home / Self Care | Payer: Medicaid Other | Attending: Emergency Medicine | Admitting: Emergency Medicine

## 2019-12-24 ENCOUNTER — Other Ambulatory Visit: Payer: Self-pay

## 2019-12-24 ENCOUNTER — Emergency Department (HOSPITAL_COMMUNITY): Payer: Medicaid Other

## 2019-12-24 DIAGNOSIS — R079 Chest pain, unspecified: Secondary | ICD-10-CM

## 2019-12-24 DIAGNOSIS — U071 COVID-19: Secondary | ICD-10-CM

## 2019-12-24 DIAGNOSIS — R42 Dizziness and giddiness: Secondary | ICD-10-CM

## 2019-12-24 LAB — CBC
HCT: 39.7 % (ref 36.0–46.0)
Hemoglobin: 13.1 g/dL (ref 12.0–15.0)
MCH: 30 pg (ref 26.0–34.0)
MCHC: 33 g/dL (ref 30.0–36.0)
MCV: 91.1 fL (ref 80.0–100.0)
Platelets: 330 10*3/uL (ref 150–400)
RBC: 4.36 MIL/uL (ref 3.87–5.11)
RDW: 11.9 % (ref 11.5–15.5)
WBC: 13.3 10*3/uL — ABNORMAL HIGH (ref 4.0–10.5)
nRBC: 0 % (ref 0.0–0.2)

## 2019-12-24 LAB — BASIC METABOLIC PANEL
Anion gap: 12 (ref 5–15)
BUN: 11 mg/dL (ref 6–20)
CO2: 25 mmol/L (ref 22–32)
Calcium: 9.3 mg/dL (ref 8.9–10.3)
Chloride: 102 mmol/L (ref 98–111)
Creatinine, Ser: 0.71 mg/dL (ref 0.44–1.00)
GFR calc Af Amer: >60 mL/min (ref >60)
GFR calc non Af Amer: >60 mL/min (ref >60)
Glucose, Bld: 139 mg/dL — ABNORMAL HIGH (ref 70–99)
Potassium: 3.5 mmol/L (ref 3.5–5.1)
Sodium: 139 mmol/L (ref 135–145)

## 2019-12-24 LAB — TROPONIN I (HIGH SENSITIVITY)
Troponin I (High Sensitivity): <2 ng/L (ref <18)
Troponin I (High Sensitivity): <2 ng/L (ref <18)

## 2019-12-24 LAB — PROTIME-INR
INR: 1.1 (ref 0.8–1.2)
Prothrombin Time: 13.6 seconds (ref 11.4–15.2)

## 2019-12-24 LAB — I-STAT BETA HCG BLOOD, ED (MC, WL, AP ONLY): I-stat hCG, quantitative: <5 m[IU]/mL (ref <5)

## 2019-12-24 MED ORDER — ACETAMINOPHEN 325 MG PO TABS
650.0000 mg | ORAL_TABLET | Freq: Once | ORAL | Status: AC
  Administered 2019-12-24: 09:00:00 650 mg via ORAL
  Filled 2019-12-24: qty 2

## 2019-12-24 MED ORDER — BENZONATATE 100 MG PO CAPS: 100.0000 mg | ORAL_CAPSULE | Freq: Three times a day (TID) | ORAL | 0 refills | Status: DC

## 2019-12-24 MED ORDER — SODIUM CHLORIDE 0.9% FLUSH: 3.0000 mL | Freq: Once | INTRAVENOUS | Status: DC

## 2019-12-24 MED ORDER — SODIUM CHLORIDE 0.9 % IV BOLUS
500.0000 mL | Freq: Once | INTRAVENOUS | Status: AC
  Administered 2019-12-24: 500 mL via INTRAVENOUS

## 2019-12-24 MED ORDER — ONDANSETRON 4 MG PO TBDP: 4.0000 mg | ORAL_TABLET | Freq: Three times a day (TID) | ORAL | 0 refills | Status: DC | PRN

## 2019-12-24 MED ORDER — ONDANSETRON 4 MG PO TBDP
4.0000 mg | ORAL_TABLET | Freq: Once | ORAL | Status: AC
  Administered 2019-12-24: 09:00:00 4 mg via ORAL
  Filled 2019-12-24: qty 1

## 2019-12-24 MED ORDER — MECLIZINE HCL 12.5 MG PO TABS: 12.5000 mg | ORAL_TABLET | Freq: Three times a day (TID) | ORAL | 0 refills | Status: DC | PRN

## 2019-12-24 NOTE — ED Triage Notes (Signed)
Patient tested positive for COVID19 last 12/19/19, reports central chest pain with SOB and dry cough , no emesis or diaphoresis , no fever or chills .

## 2019-12-24 NOTE — ED Notes (Signed)
Pt's stats remained bw 95-100 and hr no greater than 105.  However, pt stated dizziness increased with ambulation.  PA notified.

## 2019-12-24 NOTE — ED Provider Notes (Signed)
[facility] EMERGENCY DEPARTMENT Provider Note   CSN: [id number] Arrival date & time: 12/23/19  2352     History Chief Complaint  Patient presents with  . Chest Pain    Covid+    Adriana Jackson is a 19 y.o. female with a past medical history significant for asthma who presents to the ED due to chest pain, shortness of breath, and dry cough that has progressively gotten worse over the past 5 days.  Patient tested positive for Covid on 12/19/2019.  Patient describes chest pain as sharp, constant and throughout entire anterior chest wall.  Chest pain is worse when coughing.  She also admits to shortness of breath with exertion and at rest.  She has a history of asthma and has been using her albuterol inhaler with mild relief.  Patient also admits to a dry cough, nausea, and intermittent dizziness particularly when she stands up.  She has not tried anything for pain prior to arrival.  Patient is currently on azithromycin, steroids, and albuterol inhaler for her Covid symptoms.  Denies fever and chills.  Denies abdominal pain, vomiting, and diarrhea.  Patient denies history of blood clots, recent surgeries, recent long immobilizations, and hormonal treatments.  Denies lower extremity edema.  History obtained from patient and past medical records. No interpreter used during encounter.      Past Medical History:  Diagnosis Date  . Asthma     There are no problems to display for this patient.   History reviewed. No pertinent surgical history.   OB History   No obstetric history on file.     No family history on file.  Social History   Tobacco Use  . Smoking status: Never Smoker  . Smokeless tobacco: Never Used  Substance Use Topics  . Alcohol use: Not Currently  . Drug use: Not Currently    Home Medications Prior to Admission medications   Medication Sig Start Date End Date Taking? Authorizing Provider  albuterol (VENTOLIN HFA) 108 (90 Base) MCG/ACT inhaler  Inhale 2 puffs into the lungs every 6 (six) hours as needed for wheezing or shortness of breath. 12/19/19  Yes Daphine Deutscher, Mary-Margaret, FNP  azithromycin (ZITHROMAX Z-PAK) 250 MG tablet As directed 12/19/19  Yes Daphine Deutscher, Mary-Margaret, FNP  predniSONE (STERAPRED UNI-PAK 21 TAB) 10 MG (21) TBPK tablet As directed x 6 days 12/19/19  Yes Martin, Mary-Margaret, FNP  benzonatate (TESSALON) 100 MG capsule Take 1 capsule (100 mg total) by mouth every 8 (eight) hours. 12/24/19   Mannie Stabile, PA-C  dimenhyDRINATE (DRAMAMINE) 50 MG tablet Take 1 tablet (50 mg total) by mouth every 8 (eight) hours as needed for nausea or dizziness. Patient not taking: Reported on 11/06/2019 10/09/19   Michela Pitcher A, PA-C  Glucosamine Sulfate 1000 MG CAPS Take 1 capsule (1,000 mg total) by mouth 2 (two) times daily. Patient not taking: Reported on 12/24/2019 06/03/19   Hilts, Casimiro Needle, MD  meclizine (ANTIVERT) 12.5 MG tablet Take 1 tablet (12.5 mg total) by mouth 3 (three) times daily as needed for dizziness. 12/24/19   Mannie Stabile, PA-C  methylPREDNISolone (MEDROL DOSEPAK) 4 MG TBPK tablet As directed for 6 days. Patient not taking: Reported on 12/24/2019 11/10/19   Hilts, Casimiro Needle, MD  ondansetron (ZOFRAN ODT) 4 MG disintegrating tablet Take 1 tablet (4 mg total) by mouth every 8 (eight) hours as needed for nausea or vomiting. 12/24/19   Mannie Stabile, PA-C  SUMAtriptan (IMITREX) 100 MG tablet Take 0.5-1 tablets (50-100 mg total)  by mouth every 2 (two) hours as needed for migraine (Max 2 doses in 24 hours). May repeat in 2 hours if headache persists or recurs. Patient not taking: Reported on 12/24/2019 11/06/19   Hilts, Legrand Como, MD    Allergies    Penicillins  Review of Systems   Review of Systems  Constitutional: Negative for chills and fever.  Respiratory: Positive for cough and shortness of breath.   Cardiovascular: Positive for chest pain. Negative for leg swelling.  Gastrointestinal: Positive for nausea.  Negative for abdominal pain, diarrhea and vomiting.  Genitourinary: Negative for dysuria.  Neurological: Positive for dizziness and weakness (generalized).  All other systems reviewed and are negative.   Physical Exam Updated Vital Signs BP 116/88   Pulse 73   Temp 98.2 F (36.8 C) (Oral)   Resp 16   Ht 5\' 7"  (1.702 m)   Wt 55 kg   LMP 12/15/2019   SpO2 100%   BMI 18.99 kg/m   Physical Exam Vitals and nursing note reviewed.  Constitutional:      General: She is not in acute distress.    Appearance: She is not toxic-appearing.  HENT:     Head: Normocephalic.  Eyes:     Pupils: Pupils are equal, round, and reactive to light.  Neck:     Comments: No meningismus. Cardiovascular:     Rate and Rhythm: Normal rate and regular rhythm.     Pulses: Normal pulses.     Heart sounds: Normal heart sounds. No murmur. No friction rub. No gallop.   Pulmonary:     Effort: Pulmonary effort is normal.     Breath sounds: Normal breath sounds.     Comments: Respirations equal and unlabored, patient able to speak in full sentences, lungs clear to auscultation bilaterally Chest:     Comments: Reproducible tenderness throughout entire anterior chest wall.  No crepitus or deformity. Abdominal:     General: Abdomen is flat. Bowel sounds are normal. There is no distension.     Palpations: Abdomen is soft.     Tenderness: There is no abdominal tenderness. There is no guarding or rebound.  Musculoskeletal:     Cervical back: Neck supple.     Comments: No lower extremity edema.  Negative Homans sign bilaterally.  Neurological:     General: No focal deficit present.     Mental Status: She is alert.     Comments: No slurred speech or facial droop.  Initial nerves grossly intact.  Patient able to move all 4 extremities without ataxia.     ED Results / Procedures / Treatments   Labs (all labs ordered are listed, but only abnormal results are displayed) Labs Reviewed  BASIC METABOLIC PANEL -  Abnormal; Notable for the following components:      Result Value   Glucose, Bld 139 (*)    All other components within normal limits  CBC - Abnormal; Notable for the following components:   WBC 13.3 (*)    All other components within normal limits  PROTIME-INR  I-STAT BETA HCG BLOOD, ED ([facility], [facility], [facility] ONLY)  TROPONIN I (HIGH SENSITIVITY)  TROPONIN I (HIGH SENSITIVITY)    EKG EKG Interpretation  Date/Time:  Tuesday December 23 2019 23:57:18 EDT Ventricular Rate:  84 PR Interval:  130 QRS Duration: 86 QT Interval:  344 QTC Calculation: 406 R Axis:   81 Text Interpretation: Normal sinus rhythm Confirmed by Randal Buba, April ([id number]) on 12/24/2019 4:26:13 AM   Radiology DG Chest Portable 1  View  Result Date: 12/24/2019 CLINICAL DATA:  Chest pain and shortness of breath. COVID positive. EXAM: PORTABLE CHEST 1 VIEW COMPARISON:  None FINDINGS: The cardiac silhouette, mediastinal and hilar contours are within normal limits. The lungs are clear. No pleural effusions. The bony thorax is intact. IMPRESSION: No acute cardiopulmonary findings. Electronically Signed   By: Rudie Meyer M.D.   On: 12/24/2019 06:19    Procedures Procedures (including critical care time)  Medications Ordered in ED Medications  sodium chloride flush (NS) 0.9 % injection 3 mL ( Intravenous Canceled Entry 12/24/19 0838)  acetaminophen (TYLENOL) tablet 650 mg (650 mg Oral Given 12/24/19 0838)  ondansetron (ZOFRAN-ODT) disintegrating tablet 4 mg (4 mg Oral Given 12/24/19 0838)  sodium chloride 0.9 % bolus 500 mL (500 mLs Intravenous New Bag/Given 12/24/19 0910)    ED Course  I have reviewed the triage vital signs and the nursing notes.  Pertinent labs & imaging results that were available during my care of the patient were reviewed by me and considered in my medical decision making (see chart for details).  Clinical Course as of Dec 23 1052  Wed Dec 24, 2019  0653 WBC(!): 13.3 [CA]    Clinical Course User Index [CA]  Mannie Stabile, PA-C   MDM Rules/Calculators/A&P                     19 year old female presents to the ED due to chest pain, shortness of breath, and dry cough x5 days.  She has a positive Covid on 12/19/2019.,  Vitals all within normal limits.  Patient is afebrile, not tachycardic or hypoxic.  Patient in no acute distress and non-ill-appearing.  Initial evaluation was performed 7 hours after waiting in the waiting room. Reproducible tenderness to palpation throughout anterior chest wall.  No lower extremity edema.  Negative Homans sign bilaterally.  Denies history of blood clots, recent surgeries, recent long immobilizations, and hormonal treatments.  PERC negative and low risk using Wells criteria.  Doubt PE/DVT.  Suspect symptoms related to Covid infection.  Lab work, chest x-ray, and EKG performed at triage. Normal neurological exam. Doubt CVA as cause of dizziness.  CBC significant for leukocytosis at 13.3.  BMP reassuring with normal renal function no electrolyte derangements.  Delta troponin flat.  EKG personally reviewed which demonstrates normal sinus rhythm with no signs of acute ischemia.  Doubt ACS with flat troponin and normal EKG and reproducible nature of chest pain on exam.  Suspect MSK etiology.  Chest x-ray personally reviewed which is negative for signs of pneumonia, pneumothorax, or widened mediastinum.  Patient ambulated here in the ED and maintained O2 saturation above 95%. Suspect symptoms related to COVID infection. Patient notes mild improvement after IVFs. Will discharge patient with symptomatic treatment.  Instructed patient to follow-up with PCP if symptoms not improved within the next week.  Encouraged oral hydration. Strict ED precautions discussed with patient. Patient states understanding and agrees to plan. Patient discharged home in no acute distress and stable vitals  Discussed case with Dr. Criss Alvine who agrees with assessment and plan.   Adriana Jackson was evaluated  in Emergency Department on 12/24/2019 for the symptoms described in the history of present illness. She was evaluated in the context of the global COVID-19 pandemic, which necessitated consideration that the patient might be at risk for infection with the SARS-CoV-2 virus that causes COVID-19. Institutional protocols and algorithms that pertain to the evaluation of patients at risk for COVID-19 are in a state  of rapid change based on information released by regulatory bodies including the CDC and federal and state organizations. These policies and algorithms were followed during the patient's care in the ED.  Final Clinical Impression(s) / ED Diagnoses Final diagnoses:  Nonspecific chest pain  COVID-19 virus infection  Dizziness    Rx / DC Orders ED Discharge Orders         Ordered    meclizine (ANTIVERT) 12.5 MG tablet  3 times daily PRN     12/24/19 1053    ondansetron (ZOFRAN ODT) 4 MG disintegrating tablet  Every 8 hours PRN     12/24/19 1053    benzonatate (TESSALON) 100 MG capsule  Every 8 hours     12/24/19 1053           Mannie Stabile, PA-C 12/24/19 1055    Pricilla Loveless, MD 12/25/19 [phone]

## 2019-12-24 NOTE — Discharge Instructions (Addendum)
As discussed, all of your labs, chest x-ray, and EKG were unremarkable.  I suspect your symptoms are related to Covid infection.  I am sending you home with symptomatic treatment for dizziness, nausea, and cough.  Continue taking your other medications as prescribed.  If your symptoms do not improve within the next week follow-up with PCP for further evaluation.  Return to the ER for new or worsening symptoms.

## 2019-12-27 ENCOUNTER — Other Ambulatory Visit (INDEPENDENT_AMBULATORY_CARE_PROVIDER_SITE_OTHER): Payer: Self-pay | Admitting: Nurse Practitioner

## 2020-01-05 ENCOUNTER — Telehealth: Payer: Medicaid Other | Admitting: Physician Assistant

## 2020-01-05 DIAGNOSIS — R21 Rash and other nonspecific skin eruption: Secondary | ICD-10-CM

## 2020-01-05 MED ORDER — HYDROCORTISONE 0.5 % EX CREA: 1.0000 "application " | TOPICAL_CREAM | Freq: Two times a day (BID) | CUTANEOUS | 0 refills | Status: DC

## 2020-01-05 MED ORDER — PREDNISONE 10 MG PO TABS: 10.0000 mg | ORAL_TABLET | Freq: Every day | ORAL | 0 refills | Status: AC

## 2020-01-05 NOTE — Progress Notes (Signed)
E Visit for Rash  We are sorry that you are not feeling well. Here is how we plan to help!  I have prescribed Prednisone 10 mg daily for 5 days  I have also prescribed a steroid cream to use for the itching.  HOME CARE:   Take cool showers and avoid direct sunlight.  Apply cool compress or wet dressings.  Take a bath in an oatmeal bath.  Sprinkle content of one Aveeno packet under running faucet with comfortably warm water.  Bathe for 15-20 minutes, 1-2 times daily.  Pat dry with a towel. Do not rub the rash.  Use hydrocortisone cream.  Take an antihistamine like Benadryl for widespread rashes that itch.  The adult dose of Benadryl is 25-50 mg by mouth 4 times daily.  Caution:  This type of medication may cause sleepiness.  Do not drink alcohol, drive, or operate dangerous machinery while taking antihistamines.  Do not take these medications if you have prostate enlargement.  Read package instructions thoroughly on all medications that you take.  GET HELP RIGHT AWAY IF:   Symptoms don't go away after treatment.  Severe itching that persists.  If you rash spreads or swells.  If you rash begins to smell.  If it blisters and opens or develops a yellow-brown crust.  You develop a fever.  You have a sore throat.  You become short of breath.  MAKE SURE YOU:  Understand these instructions. Will watch your condition. Will get help right away if you are not doing well or get worse.  Thank you for choosing an e-visit. Your e-visit answers were reviewed by a board certified advanced clinical practitioner to complete your personal care plan. Depending upon the condition, your plan could have included both over the counter or prescription medications. Please review your pharmacy choice. Be sure that the pharmacy you have chosen is open so that you can pick up your prescription now.  If there is a problem you may message your provider in MyChart to have the prescription routed to  another pharmacy. Your safety is important to [place]. If you have drug allergies check your prescription carefully.  For the next 24 hours, you can use MyChart to ask questions about today's visit, request a non-urgent call back, or ask for a work or school excuse from your e-visit provider. You will get an email in the next two days asking about your experience. I hope that your e-visit has been valuable and will speed your recovery.  Approximately 5 minutes was spent documenting and reviewing patient's chart.

## 2020-04-07 ENCOUNTER — Telehealth: Payer: Medicaid Other | Admitting: Family

## 2020-04-07 DIAGNOSIS — J209 Acute bronchitis, unspecified: Secondary | ICD-10-CM

## 2020-04-07 MED ORDER — PREDNISONE 10 MG (21) PO TBPK: ORAL_TABLET | ORAL | 0 refills | Status: DC

## 2020-04-07 MED ORDER — BENZONATATE 100 MG PO CAPS: 100.0000 mg | ORAL_CAPSULE | Freq: Three times a day (TID) | ORAL | 0 refills | Status: DC | PRN

## 2020-04-07 NOTE — Progress Notes (Signed)
We are sorry that you are not feeling well.  Here is how we plan to help!  Based on your presentation I believe you most likely have A cough due to a virus.  This is called viral bronchitis and is best treated by rest, plenty of fluids and control of the cough.  You may use Ibuprofen or Tylenol as directed to help your symptoms.     Given your history of asthma, you need to take a daily zyrtec.   In addition you may use A non-prescription cough medication called Robitussin DAC. Take 2 teaspoons every 8 hours or Delsym: take 2 teaspoons every 12 hours., A non-prescription cough medication called Mucinex DM: take 2 tablets every 12 hours. and A prescription cough medication called Tessalon Perles 100mg . You may take 1-2 capsules every 8 hours as needed for your cough.  Prednisone 10 mg daily for 6 days (see taper instructions below)  Directions for 6 day taper: Day 1: 2 tablets before breakfast, 1 after both lunch & dinner and 2 at bedtime Day 2: 1 tab before breakfast, 1 after both lunch & dinner and 2 at bedtime Day 3: 1 tab at each meal & 1 at bedtime Day 4: 1 tab at breakfast, 1 at lunch, 1 at bedtime Day 5: 1 tab at breakfast & 1 tab at bedtime Day 6: 1 tab at breakfast   From your responses in the eVisit questionnaire you describe inflammation in the upper respiratory tract which is causing a significant cough.  This is commonly called Bronchitis and has four common causes:    Allergies  Viral Infections  Acid Reflux  Bacterial Infection Allergies, viruses and acid reflux are treated by controlling symptoms or eliminating the cause. An example might be a cough caused by taking certain blood pressure medications. You stop the cough by changing the medication. Another example might be a cough caused by acid reflux. Controlling the reflux helps control the cough.  USE OF BRONCHODILATOR ("RESCUE") INHALERS: There is a risk from using your bronchodilator too frequently.  The risk is  that over-reliance on a medication which only relaxes the muscles surrounding the breathing tubes can reduce the effectiveness of medications prescribed to reduce swelling and congestion of the tubes themselves.  Although you feel brief relief from the bronchodilator inhaler, your asthma may actually be worsening with the tubes becoming more swollen and filled with mucus.  This can delay other crucial treatments, such as oral steroid medications. If you need to use a bronchodilator inhaler daily, several times per day, you should discuss this with your provider.  There are probably better treatments that could be used to keep your asthma under control.     HOME CARE . Only take medications as instructed by your medical team. . Complete the entire course of an antibiotic. . Drink plenty of fluids and get plenty of rest. . Avoid close contacts especially the very young and the elderly . Cover your mouth if you cough or cough into your sleeve. . Always remember to wash your hands . A steam or ultrasonic humidifier can help congestion.   GET HELP RIGHT AWAY IF: . You develop worsening fever. . You become short of breath . You cough up blood. . Your symptoms persist after you have completed your treatment plan MAKE SURE YOU   Understand these instructions.  Will watch your condition.  Will get help right away if you are not doing well or get worse.  Your e-visit answers were  reviewed by a board certified advanced clinical practitioner to complete your personal care plan.  Depending on the condition, your plan could have included both over the counter or prescription medications. If there is a problem please reply  once you have received a response from your provider. Your safety is important to [place].  If you have drug allergies check your prescription carefully.    You can use MyChart to ask questions about today's visit, request a non-urgent call back, or ask for a work or school excuse for 24  hours related to this e-Visit. If it has been greater than 24 hours you will need to follow up with your provider, or enter a new e-Visit to address those concerns. You will get an e-mail in the next two days asking about your experience.  I hope that your e-visit has been valuable and will speed your recovery. Thank you for using e-visits.  .Approximately 5 minutes was spent documenting and reviewing patient's chart.

## 2020-08-01 ENCOUNTER — Encounter: Payer: Self-pay | Admitting: Family Medicine

## 2020-08-02 ENCOUNTER — Telehealth: Payer: Medicaid Other | Admitting: Emergency Medicine

## 2020-08-02 DIAGNOSIS — Z20822 Contact with and (suspected) exposure to covid-19: Secondary | ICD-10-CM

## 2020-08-02 MED ORDER — BENZONATATE 100 MG PO CAPS: 100.0000 mg | ORAL_CAPSULE | Freq: Two times a day (BID) | ORAL | 0 refills | Status: DC | PRN

## 2020-08-02 NOTE — Progress Notes (Signed)
E-Visit for Corona Virus Screening  Your current symptoms could be consistent with the coronavirus.  Many health care providers can now test patients at their office but not all are.  [facility] has multiple testing sites. For information on our COVID testing locations and hours go to [email or web address]  We are enrolling you in our MyChart Home Monitoring for COVID19 . Daily you will receive a questionnaire within the MyChart website. Our COVID 19 response team will be monitoring your responses daily.  Testing Information: The COVID-19 Community Testing sites are testing BY APPOINTMENT ONLY.  You can schedule online at [email or web address]  If you do not have access to a smart phone or computer you may call [phone] for an appointment.   Additional testing sites in the Community:  . For [facility] Testing sites in [place]  https://www.[facility]vs.com/minuteclinic/covid-19-testing  . For Pop-up testing sites in [place]  https://covid19.ncdhhs.gov/about-covid-19/testing/find-my-testing-place/pop-testing-sites  . For [facility] https://[place].gov[place]/human-services/health-department/coronavirus-covid-19-info/covid-19-testing  . For [place] testing in [place] https://www[place].gov/[place]/human-services/health-department/coronavirus-covid-19-info/covid-19-testing  . For [facility] testing in [place]   https://[facility].care/covidtesting  For  more information about community testing call [phone]   Please quarantine yourself while awaiting your test results. Please stay home for a minimum of 10 days from the first day of illness with improving symptoms and you have had 24 hours of no fever (without the use of Tylenol (Acetaminophen) Motrin (Ibuprofen) or any fever reducing medication).  Also - Do not get tested prior to returning to work because once you have had a positive test the test can stay  positive for more than a month in some cases.   You should wear a mask or cloth face covering over your nose and mouth if you must be around other people or animals, including pets (even at home). Try to stay at least 6 feet away from other people. This will protect the people around you.  Please continue good preventive care measures, including:  frequent hand-washing, avoid touching your face, cover coughs/sneezes, stay out of crowds and keep a 6 foot distance from others.  COVID-19 is a respiratory illness with symptoms that are similar to the flu. Symptoms are typically mild to moderate, but there have been cases of severe illness and death due to the virus.   The following symptoms may appear 2-14 days after exposure: . Fever . Cough . Shortness of breath or difficulty breathing . Chills . Repeated shaking with chills . Muscle pain . Headache . Sore throat . New loss of taste or smell . Fatigue . Congestion or runny nose . Nausea or vomiting . Diarrhea  Go to the nearest hospital ED for assessment if fever/cough/breathlessness are severe or illness seems like a threat to life.  It is vitally important that if you feel that you have an infection such as this virus or any other virus that you stay home and away from places where you may spread it to others.  You should avoid contact with people age 65 and older.   You can use medication such as A prescription cough medication called Tessalon Perles 100 mg. You may take 1-2 capsules every 8 hours as needed for cough  You may also take acetaminophen (Tylenol) as needed for fever.  Reduce your risk of any infection by using the same precautions used for avoiding the common cold or flu:  . Wash your hands often with soap and warm water for at least 20 seconds.  If soap and water are not   readily available, use an alcohol-based hand sanitizer with at least 60% alcohol.  . If coughing or sneezing, cover your mouth and nose by coughing or  sneezing into the elbow areas of your shirt or coat, into a tissue or into your sleeve (not your hands). . Avoid shaking hands with others and consider head nods or verbal greetings only. . Avoid touching your eyes, nose, or mouth with unwashed hands.  . Avoid close contact with people who are sick. . Avoid places or events with large numbers of people in one location, like concerts or sporting events. . Carefully consider travel plans you have or are making. . If you are planning any travel outside or inside the [place], visit the CDC's Travelers' Health webpage for the latest health notices. . If you have some symptoms but not all symptoms, continue to monitor at home and seek medical attention if your symptoms worsen. . If you are having a medical emergency, call 911.  HOME CARE . Only take medications as instructed by your medical team. . Drink plenty of fluids and get plenty of rest. . A steam or ultrasonic humidifier can help if you have congestion.   GET HELP RIGHT AWAY IF YOU HAVE EMERGENCY WARNING SIGNS** FOR COVID-19. If you or someone is showing any of these signs seek emergency medical care immediately. Call 911 or proceed to your closest emergency facility if: . You develop worsening high fever. . Trouble breathing . Bluish lips or face . Persistent pain or pressure in the chest . New confusion . Inability to wake or stay awake . You cough up blood. . Your symptoms become more severe  **This list is not all possible symptoms. Contact your medical provider for any symptoms that are sever or concerning to you.  MAKE SURE YOU   Understand these instructions.  Will watch your condition.  Will get help right away if you are not doing well or get worse.  Your e-visit answers were reviewed by a board certified advanced clinical practitioner to complete your personal care plan.  Depending on the condition, your plan could have included both over the counter or prescription  medications.  If there is a problem please reply once you have received a response from your provider.  Your safety is important to [place].  If you have drug allergies check your prescription carefully.    You can use MyChart to ask questions about today's visit, request a non-urgent call back, or ask for a work or school excuse for 24 hours related to this e-Visit. If it has been greater than 24 hours you will need to follow up with your provider, or enter a new e-Visit to address those concerns. You will get an e-mail in the next two days asking about your experience.  I hope that your e-visit has been valuable and will speed your recovery. Thank you for using e-visits.  I spent approximately 5-10 minutes reviewing the patient's chart and medical information.

## 2020-08-24 ENCOUNTER — Telehealth: Payer: Medicaid Other | Admitting: Emergency Medicine

## 2020-08-24 ENCOUNTER — Encounter (INDEPENDENT_AMBULATORY_CARE_PROVIDER_SITE_OTHER): Payer: Self-pay

## 2020-08-24 DIAGNOSIS — R399 Unspecified symptoms and signs involving the genitourinary system: Secondary | ICD-10-CM | POA: Diagnosis not present

## 2020-08-24 MED ORDER — PHENAZOPYRIDINE HCL 200 MG PO TABS: 200.0000 mg | ORAL_TABLET | Freq: Three times a day (TID) | ORAL | 0 refills | Status: DC | PRN

## 2020-08-24 MED ORDER — CEPHALEXIN 500 MG PO CAPS: 500.0000 mg | ORAL_CAPSULE | Freq: Four times a day (QID) | ORAL | 0 refills | Status: DC

## 2020-08-24 NOTE — Progress Notes (Signed)
Time spent: 10 min  We are sorry that you are not feeling well.  Here is how we plan to help!  Based on what you shared with me it looks like you most likely have a simple urinary tract infection.  A UTI (Urinary Tract Infection) is a bacterial infection of the bladder.  Most cases of urinary tract infections are simple to treat but a key part of your care is to encourage you to drink plenty of fluids and watch your symptoms carefully.  I have prescribed Keflex 500 mg twice a day for 7 days.  Your symptoms should gradually improve. Call us if the burning in your urine worsens, you develop worsening fever, back pain or pelvic pain or if your symptoms do not resolve after completing the antibiotic.  Additionally, I have sent a prescription for pyridium which is a medicine that numbs the bladder and helps with bladder pain and burning with urination.  Note that your urine will be orange (like gatorade) while on this medicine, but this will resolve after you stop taking this medicine.  Urinary tract infections can be prevented by drinking plenty of water to keep your body hydrated.  Also be sure when you wipe, wipe from front to back and don't hold it in!  If possible, empty your bladder every 4 hours.  Your e-visit answers were reviewed by a board certified advanced clinical practitioner to complete your personal care plan.  Depending on the condition, your plan could have included both over the counter or prescription medications.  If there is a problem please reply  once you have received a response from your provider.  Your safety is important to [place].  If you have drug allergies check your prescription carefully.    You can use MyChart to ask questions about today's visit, request a non-urgent call back, or ask for a work or school excuse for 24 hours related to this e-Visit. If it has been greater than 24 hours you will need to follow up with your provider, or enter a new e-Visit to address those  concerns.   You will get an e-mail in the next two days asking about your experience.  I hope that your e-visit has been valuable and will speed your recovery. Thank you for using e-visits.

## 2020-08-25 ENCOUNTER — Encounter (HOSPITAL_COMMUNITY): Payer: Self-pay

## 2020-08-25 ENCOUNTER — Ambulatory Visit (HOSPITAL_COMMUNITY)
Admission: EM | Admit: 2020-08-25 | Discharge: 2020-08-25 | Disposition: A | Discharge status: Home / Self Care | Payer: Medicaid Other | Attending: Emergency Medicine | Admitting: Emergency Medicine

## 2020-08-25 ENCOUNTER — Other Ambulatory Visit: Payer: Self-pay

## 2020-08-25 DIAGNOSIS — N898 Other specified noninflammatory disorders of vagina: Secondary | ICD-10-CM

## 2020-08-25 DIAGNOSIS — R300 Dysuria: Secondary | ICD-10-CM | POA: Diagnosis not present

## 2020-08-25 LAB — POCT URINALYSIS DIPSTICK, ED / UC
Bilirubin Urine: NEGATIVE
Glucose, UA: NEGATIVE mg/dL
Ketones, ur: NEGATIVE mg/dL
Leukocytes,Ua: NEGATIVE
Nitrite: NEGATIVE
Protein, ur: NEGATIVE mg/dL
Specific Gravity, Urine: 1.02 (ref 1.005–1.030)
Urobilinogen, UA: 0.2 mg/dL (ref 0.0–1.0)
pH: 6 (ref 5.0–8.0)

## 2020-08-25 LAB — CERVICOVAGINAL ANCILLARY ONLY
Bacterial Vaginitis (gardnerella): NEGATIVE
Candida Glabrata: NEGATIVE
Candida Vaginitis: POSITIVE — AB
Chlamydia: NEGATIVE
Comment: NEGATIVE
Comment: NEGATIVE
Comment: NEGATIVE
Comment: NEGATIVE
Comment: NEGATIVE
Comment: NORMAL
Neisseria Gonorrhea: NEGATIVE
Trichomonas: NEGATIVE

## 2020-08-25 LAB — POC URINE PREG, ED: Preg Test, Ur: NEGATIVE

## 2020-08-25 NOTE — ED Provider Notes (Signed)
[facility]    CSN: [id number] Arrival date & time: 08/25/20  0809      History   Chief Complaint Chief Complaint  Patient presents with  . Urinary Tract Infection    HPI Adriana Jackson is a 19 y.o. adult.   1 week of pain with urination as well as urinary frequency. Some low back cramping. No other gi symptoms. Noted some blood to urine. Has had vaginal spotting, although she had recently taken a Plan B. LMP 11/21. No specific known concerns/ exposures for stds. Has noted some increased vaginal discharge recently. Has had UTI in the past which felt similar. Has felt feverish without specific known fever.     ROS per HPI, negative if not otherwise mentioned.      Past Medical History:  Diagnosis Date  . Asthma     There are no problems to display for this patient.   History reviewed. No pertinent surgical history.  OB History   No obstetric history on file.      Home Medications    Prior to Admission medications   Medication Sig Start Date End Date Taking? Authorizing Provider  albuterol (VENTOLIN HFA) 108 (90 Base) MCG/ACT inhaler Inhale 2 puffs into the lungs every 6 (six) hours as needed for wheezing or shortness of breath. 12/19/19   Bennie Pierini, FNP  azithromycin (ZITHROMAX Z-PAK) 250 MG tablet As directed 12/19/19   Daphine Deutscher, Mary-Margaret, FNP  benzonatate (TESSALON PERLES) 100 MG capsule Take 1 capsule (100 mg total) by mouth 3 (three) times daily as needed. 04/07/20   Junie Spencer, FNP  benzonatate (TESSALON) 100 MG capsule Take 1 capsule (100 mg total) by mouth 2 (two) times daily as needed for cough. 08/02/20   Maxwell Caul, PA-C  cephALEXin (KEFLEX) 500 MG capsule Take 1 capsule (500 mg total) by mouth 4 (four) times daily for 7 days. 08/24/20 08/31/20  Liberty Handy, PA-C  dimenhyDRINATE (DRAMAMINE) 50 MG tablet Take 1 tablet (50 mg total) by mouth every 8 (eight) hours as needed for nausea or dizziness. Patient not taking:  Reported on 11/06/2019 10/09/19   Michela Pitcher A, PA-C  Glucosamine Sulfate 1000 MG CAPS Take 1 capsule (1,000 mg total) by mouth 2 (two) times daily. Patient not taking: Reported on 12/24/2019 06/03/19   Hilts, Michael, MD  hydrocortisone cream 0.5 % Apply 1 application topically 2 (two) times daily. 01/05/20   Couture, Cortni S, PA-C  meclizine (ANTIVERT) 12.5 MG tablet Take 1 tablet (12.5 mg total) by mouth 3 (three) times daily as needed for dizziness. 12/24/19   Mannie Stabile, PA-C  ondansetron (ZOFRAN ODT) 4 MG disintegrating tablet Take 1 tablet (4 mg total) by mouth every 8 (eight) hours as needed for nausea or vomiting. 12/24/19   Mannie Stabile, PA-C  phenazopyridine (PYRIDIUM) 200 MG tablet Take 1 tablet (200 mg total) by mouth 3 (three) times daily as needed for pain. 08/24/20   Liberty Handy, PA-C  predniSONE (STERAPRED UNI-PAK 21 TAB) 10 MG (21) TBPK tablet Use as directed 04/07/20   Jannifer Rodney A, FNP  SUMAtriptan (IMITREX) 100 MG tablet Take 0.5-1 tablets (50-100 mg total) by mouth every 2 (two) hours as needed for migraine (Max 2 doses in 24 hours). May repeat in 2 hours if headache persists or recurs. Patient not taking: Reported on 12/24/2019 11/06/19   Lavada Mesi, MD    Family History History reviewed. No pertinent family history.  Social History Social History  Tobacco Use  . Smoking status: Never Smoker  . Smokeless tobacco: Never Used  Substance Use Topics  . Alcohol use: Not Currently  . Drug use: Not Currently     Allergies   Penicillins   Review of Systems Review of Systems   Physical Exam Triage Vital Signs ED Triage Vitals  Enc Vitals Group     BP 08/25/20 0848 130/80     Pulse Rate 08/25/20 0848 81     Resp 08/25/20 0848 18     Temp 08/25/20 0848 98.7 F (37.1 C)     Temp Source 08/25/20 0848 Oral     SpO2 08/25/20 0848 96 %     Weight --      Height --      Head Circumference --      Peak Flow --      Pain Score 08/25/20 0846  9     Pain Loc --      Pain Edu? --      Excl. in GC? --    No data found.  Updated Vital Signs BP 130/80 (BP Location: Left Arm)   Pulse 81   Temp 98.7 F (37.1 C) (Oral)   Resp 18   LMP 08/15/2020 (Approximate)   SpO2 96%    Physical Exam Constitutional:      General: Adriana Jackson is not in acute distress.    Appearance: Adriana Jackson is well-developed.  Cardiovascular:     Rate and Rhythm: Normal rate.  Pulmonary:     Effort: Pulmonary effort is normal.  Abdominal:     Palpations: Abdomen is not rigid.     Tenderness: There is no abdominal tenderness. There is no right CVA tenderness, left CVA tenderness, guarding or rebound.  Genitourinary:    Comments: Denies sores, lesions, vaginal bleeding; no pelvic pain; gu exam deferred at this time, vaginal self swab collected.   Skin:    General: Skin is warm and dry.  Neurological:     Mental Status: Adriana Jackson is alert and oriented to person, place, and time.      UC Treatments / Results  Labs (all labs ordered are listed, but only abnormal results are displayed) Labs Reviewed  POCT URINALYSIS DIPSTICK, ED / UC - Abnormal; Notable for the following components:      Result Value   Hgb urine dipstick LARGE (*)    All other components within normal limits  URINE CULTURE  POC URINE PREG, ED    EKG   Radiology No results found.  Procedures Procedures (including critical care time)  Medications Ordered in UC Medications - No data to display  Initial Impression / Assessment and Plan / UC Course  I have reviewed the triage vital signs and the nursing notes.  Pertinent labs & imaging results that were available during my care of the patient were reviewed by me and considered in my medical decision making (see chart for details).    Bilateral back pain and urinary symptoms for the past week. Aleve last night did not help. No gi symptoms. Some vaginal spotting and discharge, recent plan b. Urine with hgb otherwise  normal. Culture pending as well. Vaginal cytology pending. Cramping/ back pain related to plan b; nephrolithiasis; vaginitis considered and discussed with patient. Will notify of any positive test results. Pain management discussed. Return precautions provided. Patient verbalized understanding and agreeable to plan.   Final Clinical Impressions(s) / UC Diagnoses   Final diagnoses:  Dysuria  Vaginal discharge  Discharge Instructions     Your urine does not indicated urinary tract infection today.  There is microscopic blood, which can be even contaminate from vaginal spotting as well.  I have sent your urine to be cultured to confirm no infection.  Your vaginal testing is also in process. We will notify of you any positive findings or if any changes to treatment are needed. If normal or otherwise without concern to your results, we will not call you. Please log on to your MyChart to review your results if interested in so.   Drink plenty of water to empty bladder regularly. Avoid alcohol and caffeine as these may irritate the bladder.   If symptoms worsen or do not improve in the next week to return to be seen or to follow up with your PCP.      ED Prescriptions    None     PDMP not reviewed this encounter.   Georgetta Haber, NP 08/25/20 [phone]

## 2020-08-25 NOTE — ED Triage Notes (Signed)
Pt in with c/o uti sxs. States she has been having burning sensation during urination, lower back pain, and hematuria that has been going on for about 1 week.  Also c/o subjective fever

## 2020-08-25 NOTE — Discharge Instructions (Signed)
Your urine does not indicated urinary tract infection today.  There is microscopic blood, which can be even contaminate from vaginal spotting as well.  I have sent your urine to be cultured to confirm no infection.  Your vaginal testing is also in process. We will notify of you any positive findings or if any changes to treatment are needed. If normal or otherwise without concern to your results, we will not call you. Please log on to your MyChart to review your results if interested in so.   Drink plenty of water to empty bladder regularly. Avoid alcohol and caffeine as these may irritate the bladder.   If symptoms worsen or do not improve in the next week to return to be seen or to follow up with your PCP.

## 2020-08-25 NOTE — ED Notes (Signed)
Pt unable to urinate at this time. Pt given another cup of water

## 2020-08-26 ENCOUNTER — Telehealth (HOSPITAL_COMMUNITY): Payer: Self-pay | Admitting: Emergency Medicine

## 2020-08-26 LAB — URINE CULTURE: Culture: NO GROWTH

## 2020-08-26 MED ORDER — FLUCONAZOLE 150 MG PO TABS: 150.0000 mg | ORAL_TABLET | Freq: Once | ORAL | 0 refills | Status: AC

## 2020-08-29 ENCOUNTER — Other Ambulatory Visit: Payer: Self-pay

## 2020-08-29 ENCOUNTER — Emergency Department (HOSPITAL_COMMUNITY): Payer: Medicaid Other

## 2020-08-29 ENCOUNTER — Encounter (HOSPITAL_COMMUNITY): Payer: Self-pay | Admitting: Obstetrics and Gynecology

## 2020-08-29 ENCOUNTER — Emergency Department (HOSPITAL_COMMUNITY)
Admission: EM | Admit: 2020-08-29 | Discharge: 2020-08-29 | Disposition: A | Discharge status: Home / Self Care | Payer: Medicaid Other | Attending: Emergency Medicine | Admitting: Emergency Medicine

## 2020-08-29 DIAGNOSIS — R319 Hematuria, unspecified: Secondary | ICD-10-CM | POA: Diagnosis present

## 2020-08-29 DIAGNOSIS — M545 Low back pain, unspecified: Secondary | ICD-10-CM | POA: Insufficient documentation

## 2020-08-29 DIAGNOSIS — R300 Dysuria: Secondary | ICD-10-CM | POA: Insufficient documentation

## 2020-08-29 LAB — I-STAT BETA HCG BLOOD, ED (MC, WL, AP ONLY): I-stat hCG, quantitative: <5 m[IU]/mL (ref <5)

## 2020-08-29 LAB — URINALYSIS, ROUTINE W REFLEX MICROSCOPIC
Bilirubin Urine: NEGATIVE
Glucose, UA: NEGATIVE mg/dL
Ketones, ur: NEGATIVE mg/dL
Nitrite: NEGATIVE
Protein, ur: NEGATIVE mg/dL
Specific Gravity, Urine: 1.017 (ref 1.005–1.030)
pH: 5 (ref 5.0–8.0)

## 2020-08-29 LAB — BASIC METABOLIC PANEL
Anion gap: 9 (ref 5–15)
BUN: 14 mg/dL (ref 6–20)
CO2: 26 mmol/L (ref 22–32)
Calcium: 9.6 mg/dL (ref 8.9–10.3)
Chloride: 104 mmol/L (ref 98–111)
Creatinine, Ser: 0.81 mg/dL (ref 0.44–1.00)
GFR, Estimated: >60 mL/min (ref >60)
Glucose, Bld: 94 mg/dL (ref 70–99)
Potassium: 3.5 mmol/L (ref 3.5–5.1)
Sodium: 139 mmol/L (ref 135–145)

## 2020-08-29 LAB — CBC
HCT: 41.4 % (ref 36.0–46.0)
Hemoglobin: 14 g/dL (ref 12.0–15.0)
MCH: 30.7 pg (ref 26.0–34.0)
MCHC: 33.8 g/dL (ref 30.0–36.0)
MCV: 90.8 fL (ref 80.0–100.0)
Platelets: 335 10*3/uL (ref 150–400)
RBC: 4.56 MIL/uL (ref 3.87–5.11)
RDW: 12.3 % (ref 11.5–15.5)
WBC: 8.8 10*3/uL (ref 4.0–10.5)
nRBC: 0 % (ref 0.0–0.2)

## 2020-08-29 MED ORDER — CEPHALEXIN 500 MG PO CAPS: 500.0000 mg | ORAL_CAPSULE | Freq: Four times a day (QID) | ORAL | 0 refills | Status: AC

## 2020-08-29 MED ORDER — KETOROLAC TROMETHAMINE 60 MG/2ML IM SOLN
60.0000 mg | Freq: Once | INTRAMUSCULAR | Status: AC
  Administered 2020-08-29: 60 mg via INTRAMUSCULAR
  Filled 2020-08-29: qty 2

## 2020-08-29 MED ORDER — METHOCARBAMOL 500 MG PO TABS: 500.0000 mg | ORAL_TABLET | Freq: Two times a day (BID) | ORAL | 0 refills | Status: DC

## 2020-08-29 MED ORDER — IBUPROFEN 600 MG PO TABS: 600.0000 mg | ORAL_TABLET | Freq: Four times a day (QID) | ORAL | 0 refills | Status: DC | PRN

## 2020-08-29 NOTE — ED Provider Notes (Signed)
[facility]-EMERGENCY DEPT Provider Note   CSN: [id number] Arrival date & time: 08/29/20  1031     History Chief Complaint  Patient presents with   Hematuria    Adriana Jackson is a 19 y.o. adult.  HPI 19 year old nonbinary biological female with history of asthma presents to the ER with complaints of 1 week of low back pain.  Pain is bilateral, sharp and stabbing, nonradiating.  Fairly constant.  Denies any nausea or vomiting.  Has had mild dysuria and has noticed some blood in their urine.  Seen at urgent care on 12/1, had pelvic exam with STD testing, UA which were overall reassuring.  Negative pregnancy.  Patient states that the pain has continued.  They have not taken anything for their pain.  Has felt feverish but no measurable fevers.  No prior history of kidney stones.    Past Medical History:  Diagnosis Date   Asthma     There are no problems to display for this patient.   No past surgical history on file.   OB History   No obstetric history on file.     No family history on file.  Social History   Tobacco Use   Smoking status: Never Smoker   Smokeless tobacco: Never Used  Substance Use Topics   Alcohol use: Not Currently   Drug use: Not Currently    Home Medications Prior to Admission medications   Medication Sig Start Date End Date Taking? Authorizing Provider  albuterol (VENTOLIN HFA) 108 (90 Base) MCG/ACT inhaler Inhale 2 puffs into the lungs every 6 (six) hours as needed for wheezing or shortness of breath. 12/19/19  Yes Daphine Deutscher, Mary-Margaret, FNP  Glucosamine Sulfate 1000 MG CAPS Take 1 capsule (1,000 mg total) by mouth 2 (two) times daily. 06/03/19  Yes Hilts, Casimiro Needle, MD  benzonatate (TESSALON PERLES) 100 MG capsule Take 1 capsule (100 mg total) by mouth 3 (three) times daily as needed. 04/07/20   Junie Spencer, FNP  benzonatate (TESSALON) 100 MG capsule Take 1 capsule (100 mg total) by mouth 2 (two) times daily as needed for  cough. Patient not taking: Reported on 08/29/2020 08/02/20   Graciella Freer A, PA-C  cephALEXin (KEFLEX) 500 MG capsule Take 1 capsule (500 mg total) by mouth every 6 (six) hours for 7 days. 08/29/20 09/05/20  Mare Ferrari, PA-C  hydrocortisone cream 0.5 % Apply 1 application topically 2 (two) times daily. 01/05/20   Couture, Cortni S, PA-C  ibuprofen (ADVIL) 600 MG tablet Take 1 tablet (600 mg total) by mouth every 6 (six) hours as needed. 08/29/20   Mare Ferrari, PA-C  methocarbamol (ROBAXIN) 500 MG tablet Take 1 tablet (500 mg total) by mouth 2 (two) times daily. 08/29/20   Mare Ferrari, PA-C  phenazopyridine (PYRIDIUM) 200 MG tablet Take 1 tablet (200 mg total) by mouth 3 (three) times daily as needed for pain. Patient not taking: Reported on 08/29/2020 08/24/20   Liberty Handy, PA-C    Allergies    Patient has no known allergies.  Review of Systems   Review of Systems  Constitutional: Negative for chills and fever.  HENT: Negative for ear pain and sore throat.   Eyes: Negative for pain and visual disturbance.  Respiratory: Negative for cough and shortness of breath.   Cardiovascular: Negative for chest pain and palpitations.  Gastrointestinal: Negative for abdominal pain and vomiting.  Genitourinary: Positive for dysuria. Negative for hematuria.  Musculoskeletal: Positive for back pain. Negative for  arthralgias.  Skin: Negative for color change and rash.  Neurological: Negative for seizures and syncope.  All other systems reviewed and are negative.   Physical Exam Updated Vital Signs BP 119/88 (BP Location: Right Arm)    Pulse 82    Temp 98 F (36.7 C) (Oral)    Resp 16    LMP 08/15/2020 (Approximate)    SpO2 99%   Physical Exam Vitals and nursing note reviewed.  Constitutional:      General: Adriana Jackson is not in acute distress.    Appearance: Adriana Jackson is well-developed. Adriana Jackson is not ill-appearing, toxic-appearing or diaphoretic.  HENT:     Head:  Normocephalic and atraumatic.     Mouth/Throat:     Pharynx: No oropharyngeal exudate.  Eyes:     Conjunctiva/sclera: Conjunctivae normal.  Cardiovascular:     Rate and Rhythm: Normal rate and regular rhythm.     Pulses: Normal pulses.     Heart sounds: Normal heart sounds. No murmur heard.   Pulmonary:     Effort: Pulmonary effort is normal. No respiratory distress.     Breath sounds: Normal breath sounds.  Abdominal:     General: Abdomen is flat.     Palpations: Abdomen is soft.     Tenderness: There is no abdominal tenderness. There is right CVA tenderness and left CVA tenderness.  Musculoskeletal:        General: Normal range of motion.     Cervical back: Neck supple.  Skin:    General: Skin is warm and dry.  Neurological:     General: No focal deficit present.     Mental Status: Adriana Jackson is alert and oriented to person, place, and time.  Psychiatric:        Mood and Affect: Mood normal.        Behavior: Behavior normal.     ED Results / Procedures / Treatments   Labs (all labs ordered are listed, but only abnormal results are displayed) Labs Reviewed  URINALYSIS, ROUTINE W REFLEX MICROSCOPIC - Abnormal; Notable for the following components:      Result Value   APPearance HAZY (*)    Hgb urine dipstick SMALL (*)    Leukocytes,Ua TRACE (*)    Bacteria, UA RARE (*)    All other components within normal limits  URINE CULTURE  BASIC METABOLIC PANEL  CBC  I-STAT BETA HCG BLOOD, ED ([facility], [facility], [facility] ONLY)    EKG None  Radiology CT Renal Stone Study  Result Date: 08/29/2020 CLINICAL DATA:  Left-sided flank pain for 1 week EXAM: CT ABDOMEN AND PELVIS WITHOUT CONTRAST TECHNIQUE: Multidetector CT imaging of the abdomen and pelvis was performed following the standard protocol without IV contrast. COMPARISON:  None. FINDINGS: Lower chest: No acute abnormality. Hepatobiliary: No focal liver abnormality is seen. No gallstones, gallbladder wall thickening, or biliary  dilatation. Pancreas: Unremarkable. No pancreatic ductal dilatation or surrounding inflammatory changes. Spleen: Normal in size without focal abnormality. Adrenals/Urinary Tract: Adrenal glands are within normal limits. Kidneys show no renal calculi or obstructive changes. Bladder is partially distended. Stomach/Bowel: The appendix is within normal limits. No obstructive or inflammatory changes of the large or small bowel are seen. Stomach is within normal limits. Vascular/Lymphatic: No significant vascular findings are present. No enlarged abdominal or pelvic lymph nodes. Reproductive: Uterus and bilateral adnexa are unremarkable. Other: No abdominal wall hernia or abnormality. No abdominopelvic ascites. Musculoskeletal: No acute or significant osseous findings. IMPRESSION: No acute abnormality noted. Electronically Signed  By: Alcide Clever M.D.   On: 08/29/2020 12:14    Procedures Procedures (including critical care time)  Medications Ordered in ED Medications  ketorolac (TORADOL) injection 60 mg (60 mg Intramuscular Given 08/29/20 1146)    ED Course  I have reviewed the triage vital signs and the nursing notes.  Pertinent labs & imaging results that were available during my care of the patient were reviewed by me and considered in my medical decision making (see chart for details).    MDM Rules/Calculators/A&P                          19 year old female with bilateral back pain, dysuria On presentation, they are alert, oriented, nontoxic-appearing, no acute distress.  Vitals overall reassuring.  Physical exam with bilateral flank pain,abdomen soft and nontender, no rebound or guarding.  Labs reviewed and interpreted by myself -CBC without leukocytosis -BMP unremarkable -Harriett Sine negative -UA with small white hemoglobin, trace leukocytes and rare bacteria, culture pending   Imaging reviewed by myself and radiology -CT renal scan without acute abnormality  MDM: Overall work-up  reassuring.  No evidence of kidney stones or appendicitis on exam.  Low suspicion for pelvic pathology such as torsion, TOA, PID as the patient just recently had a pelvic exam with STD testing which was normal as per chart review.  Pain improved with Toradol.  Patient sitting up in the ER bed, drinking fluids with no difficulty.  Nontoxic-appearing.  Suspect possible UTI/Pilo, or musculoskeletal element.  Will send home with Keflex, ibuprofen and Robaxin.  Patient was instructed on sedating side effects of the also relaxer and to not drink or drive on the medication.  I encouraged them to follow-up with their PCP if their symptoms continue.  Strict return precautions discussed.  They voiced understanding and is agreeable.  At this stage in the ED course, the patient is medically screened and stable for discharge.    Final Clinical Impression(s) / ED Diagnoses Final diagnoses:  Acute bilateral low back pain without sciatica    Rx / DC Orders ED Discharge Orders         Ordered    cephALEXin (KEFLEX) 500 MG capsule  Every 6 hours        08/29/20 1259    methocarbamol (ROBAXIN) 500 MG tablet  2 times daily        08/29/20 1259    ibuprofen (ADVIL) 600 MG tablet  Every 6 hours PRN        08/29/20 1259           Leone Brand 08/29/20 1259    Lorre Nick, MD 08/30/20 1341

## 2020-08-29 NOTE — Discharge Instructions (Addendum)
Your work-up today overall showed that you may have a UTI.  We have sent her urine off for repeat culture.  However given that you continue to have symptoms, we will treat you as if you do have a UTI.  Please take the antibiotics as directed until finished even if you feel better.  There also be an element of musculoskeletal back pain.  I have prescribed some ibuprofen, and a muscle relaxer called Robaxin.  Please take the muscle relaxer at night, and do not drink or drive the medication as it can make you sleepy.  Please make sure to follow-up with your primary care doctor.  If you do not have a primary care doctor, please call the phone number in your discharge paperwork in order to establish with 1.  Return to the ER for any new or worsening symptoms.

## 2020-08-29 NOTE — ED Triage Notes (Signed)
Pt reports to the ER for c/o blood in urine. Patient reports they were recently tested for STDs, UTI and Pregnancy and all were normal. Reports left sided flank pain. Denies hx of kidney stone.

## 2020-08-30 LAB — URINE CULTURE

## 2020-09-10 ENCOUNTER — Encounter: Payer: Self-pay | Admitting: Family Medicine

## 2020-09-14 ENCOUNTER — Ambulatory Visit (INDEPENDENT_AMBULATORY_CARE_PROVIDER_SITE_OTHER): Payer: Medicaid Other | Admitting: Family Medicine

## 2020-09-14 ENCOUNTER — Other Ambulatory Visit: Payer: Self-pay

## 2020-09-14 DIAGNOSIS — G8929 Other chronic pain: Secondary | ICD-10-CM

## 2020-09-14 DIAGNOSIS — M25561 Pain in right knee: Secondary | ICD-10-CM

## 2020-09-14 DIAGNOSIS — M25562 Pain in left knee: Secondary | ICD-10-CM

## 2020-09-14 LAB — VITAMIN D 25 HYDROXY (VIT D DEFICIENCY, FRACTURES): Vit D, 25-Hydroxy: 26 ng/mL — ABNORMAL LOW (ref 30–100)

## 2020-09-14 NOTE — Progress Notes (Signed)
° °  Office Visit Note   Patient: Adriana Jackson           Date of Birth: Nov 12, 2000           MRN: [id number] Visit Date: 09/14/2020 Requested by: No referring provider defined for this encounter. PCP: Patient, No Pcp Per  Subjective: Chief Complaint  Patient presents with   Right Knee - Pain    Has "always" had pain in both knees - neither worse than the other. Did have a "twisting" of the knees about a month ago - the pain has worsened since then. + popping - pain worsens afterward (they say all they're joints are like that). No otc pain relievers help.   Left Knee - Pain    HPI: Adriana Jackson is here with bilateral knee pain. Pain as long as she can remember. I have seen her for this in the past and gave her some home exercises which she did for a little while, but did not seem to make much difference. Lately her pain is getting worse, sharp pains in the anterior knees when she is squatting or twisting.               ROS:   All other systems were reviewed and are negative.  Objective: Vital Signs: LMP 08/15/2020 (Approximate)   Physical Exam:  General:  Alert and oriented, in no acute distress. Pulm:  Breathing unlabored. Psy:  Normal mood, congruent affect.  Knees: Bilateral wide Q angles, no patellofemoral crepitus. Mild pain with patella compression on both sides, no effusions, ligaments are stable. No joint line tenderness and no pain or click with McMurray's.   Imaging: No results found.  Assessment & Plan: 1. 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 If symptoms persist, then x-rays and MRI scan. -We will check vitamin D level as well since her sister has similar symptoms and is deficient.     Procedures: No procedures performed        PMFS History: There are no problems to display for this patient.  Past Medical History:  Diagnosis Date   Asthma     No family history on file.  No past surgical history on  file. Social History   Occupational History   Not on file  Tobacco Use   Smoking status: Never Smoker   Smokeless tobacco: Never Used  Substance and Sexual Activity   Alcohol use: Not Currently   Drug use: Not Currently   Sexual activity: Yes

## 2020-09-15 ENCOUNTER — Telehealth: Payer: Self-pay | Admitting: Family Medicine

## 2020-09-15 DIAGNOSIS — E559 Vitamin D deficiency, unspecified: Secondary | ICD-10-CM

## 2020-09-15 NOTE — Telephone Encounter (Signed)
Vitamin D is low (goal level is 50-80).  I recommend taking vitamin D3 at 5,000 IU daily long-term.  Recheck levels in 3-6 months to be sure you're in range.

## 2020-11-23 ENCOUNTER — Telehealth: Payer: Medicaid Other | Admitting: Physician Assistant

## 2020-11-23 DIAGNOSIS — T7840XA Allergy, unspecified, initial encounter: Secondary | ICD-10-CM

## 2020-11-23 MED ORDER — ALBUTEROL SULFATE HFA 108 (90 BASE) MCG/ACT IN AERS: 2.0000 | INHALATION_SPRAY | Freq: Four times a day (QID) | RESPIRATORY_TRACT | 0 refills | Status: DC | PRN

## 2020-11-23 MED ORDER — LEVOCETIRIZINE DIHYDROCHLORIDE 5 MG PO TABS
5.0000 mg | ORAL_TABLET | Freq: Every evening | ORAL | 0 refills | Status: DC
Start: 2020-11-23 — End: 2021-03-29

## 2020-11-23 MED ORDER — FLUTICASONE PROPIONATE 50 MCG/ACT NA SUSP: 2.0000 | Freq: Every day | NASAL | 0 refills | Status: DC

## 2020-11-23 NOTE — Progress Notes (Signed)
E visit for Allergic Rhinitis We are sorry that you are not feeling well.  Here is how we plan to help!  Based on what you have shared with me it looks like you have Allergic Rhinitis.  Rhinitis is when a reaction occurs that causes nasal congestion, runny nose, sneezing, and itching.  Most types of rhinitis are caused by an inflammation and are associated with symptoms in the eyes ears or throat. There are several types of rhinitis.  The most common are acute rhinitis, which is usually caused by a viral illness, allergic or seasonal rhinitis, and nonallergic or year-round rhinitis.  Nasal allergies occur certain times of the year.  Allergic rhinitis is caused when allergens in the air trigger the release of histamine in the body.  Histamine causes itching, swelling, and fluid to build up in the fragile linings of the nasal passages, sinuses and eyelids.  An itchy nose and clear discharge are common.  I recommend the following over the counter treatments: You should take a daily dose of antihistamine and Xyzal 5 mg take 1 tablet daily. Take this instead of claritin.  I also would recommend a nasal spray: Flonase 2 sprays into each nostril once daily   I have also prescribed an albuterol inhaler to help with your shortness of breath. If the shortness of breath persists despite taking albuterol of if it is sever in nature you will need to be seen for an in person visit.    HOME CARE:   You can use an over-the-counter saline nasal spray as needed  Avoid areas where there is heavy dust, mites, or molds  Stay indoors on windy days during the pollen season  Keep windows closed in home, at least in bedroom; use air conditioner.  Use high-efficiency house air filter  Keep windows closed in car, turn AC on re-circulate  Avoid playing out with dog during pollen season  GET HELP RIGHT AWAY IF:   If your symptoms do not improve within 10 days  You become short of breath  You develop yellow  or green discharge from your nose for over 3 days  You have coughing fits  MAKE SURE YOU:   Understand these instructions  Will watch your condition  Will get help right away if you are not doing well or get worse  Thank you for choosing an e-visit. Your e-visit answers were reviewed by a board certified advanced clinical practitioner to complete your personal care plan. Depending upon the condition, your plan could have included both over the counter or prescription medications. Please review your pharmacy choice. Be sure that the pharmacy you have chosen is open so that you can pick up your prescription now.  If there is a problem you may message your provider in MyChart to have the prescription routed to another pharmacy. Your safety is important to [place]. If you have drug allergies check your prescription carefully.  For the next 24 hours, you can use MyChart to ask questions about today's visit, request a non-urgent call back, or ask for a work or school excuse from your e-visit provider. You will get an email in the next two days asking about your experience. I hope that your e-visit has been valuable and will speed your recovery.  Approximately 5 minutes was spent documenting and reviewing patient's chart.

## 2020-12-01 ENCOUNTER — Encounter: Payer: Self-pay | Admitting: Family Medicine

## 2021-03-29 ENCOUNTER — Other Ambulatory Visit: Payer: Self-pay

## 2021-03-29 ENCOUNTER — Emergency Department (HOSPITAL_COMMUNITY)
Admission: EM | Admit: 2021-03-29 | Discharge: 2021-03-30 | Disposition: A | Discharge status: Other Acute Inpatient Hospital | Payer: Medicaid Other | Source: Home / Self Care | Attending: Emergency Medicine | Admitting: Emergency Medicine

## 2021-03-29 ENCOUNTER — Encounter (HOSPITAL_COMMUNITY): Payer: Self-pay

## 2021-03-29 DIAGNOSIS — J45909 Unspecified asthma, uncomplicated: Secondary | ICD-10-CM | POA: Insufficient documentation

## 2021-03-29 DIAGNOSIS — F1020 Alcohol dependence, uncomplicated: Secondary | ICD-10-CM | POA: Insufficient documentation

## 2021-03-29 DIAGNOSIS — Z20822 Contact with and (suspected) exposure to covid-19: Secondary | ICD-10-CM | POA: Insufficient documentation

## 2021-03-29 DIAGNOSIS — Y900 Blood alcohol level of less than 20 mg/100 ml: Secondary | ICD-10-CM | POA: Insufficient documentation

## 2021-03-29 DIAGNOSIS — R45851 Suicidal ideations: Secondary | ICD-10-CM

## 2021-03-29 DIAGNOSIS — Z7951 Long term (current) use of inhaled steroids: Secondary | ICD-10-CM | POA: Insufficient documentation

## 2021-03-29 DIAGNOSIS — F1220 Cannabis dependence, uncomplicated: Secondary | ICD-10-CM | POA: Insufficient documentation

## 2021-03-29 DIAGNOSIS — T424X2A Poisoning by benzodiazepines, intentional self-harm, initial encounter: Secondary | ICD-10-CM | POA: Insufficient documentation

## 2021-03-29 DIAGNOSIS — F333 Major depressive disorder, recurrent, severe with psychotic symptoms: Secondary | ICD-10-CM | POA: Insufficient documentation

## 2021-03-29 LAB — COMPREHENSIVE METABOLIC PANEL
ALT: 10 U/L (ref 0–44)
AST: 17 U/L (ref 15–41)
Albumin: 4.3 g/dL (ref 3.5–5.0)
Alkaline Phosphatase: 54 U/L (ref 38–126)
Anion gap: 7 (ref 5–15)
BUN: 12 mg/dL (ref 6–20)
CO2: 27 mmol/L (ref 22–32)
Calcium: 9.2 mg/dL (ref 8.9–10.3)
Chloride: 107 mmol/L (ref 98–111)
Creatinine, Ser: 0.51 mg/dL (ref 0.44–1.00)
GFR, Estimated: >60 mL/min (ref >60)
Glucose, Bld: 93 mg/dL (ref 70–99)
Potassium: 3.5 mmol/L (ref 3.5–5.1)
Sodium: 141 mmol/L (ref 135–145)
Total Bilirubin: 0.5 mg/dL (ref 0.3–1.2)
Total Protein: 7.4 g/dL (ref 6.5–8.1)

## 2021-03-29 LAB — CBC WITH DIFFERENTIAL/PLATELET
Abs Immature Granulocytes: 0.01 10*3/uL (ref 0.00–0.07)
Basophils Absolute: 0.1 10*3/uL (ref 0.0–0.1)
Basophils Relative: 1 %
Eosinophils Absolute: 0.2 10*3/uL (ref 0.0–0.5)
Eosinophils Relative: 3 %
HCT: 37.9 % (ref 36.0–46.0)
Hemoglobin: 12.8 g/dL (ref 12.0–15.0)
Immature Granulocytes: 0 %
Lymphocytes Relative: 32 %
Lymphs Abs: 2.7 10*3/uL (ref 0.7–4.0)
MCH: 30.4 pg (ref 26.0–34.0)
MCHC: 33.8 g/dL (ref 30.0–36.0)
MCV: 90 fL (ref 80.0–100.0)
Monocytes Absolute: 0.8 10*3/uL (ref 0.1–1.0)
Monocytes Relative: 9 %
Neutro Abs: 4.6 10*3/uL (ref 1.7–7.7)
Neutrophils Relative %: 55 %
Platelets: 280 10*3/uL (ref 150–400)
RBC: 4.21 MIL/uL (ref 3.87–5.11)
RDW: 11.9 % (ref 11.5–15.5)
WBC: 8.3 10*3/uL (ref 4.0–10.5)
nRBC: 0 % (ref 0.0–0.2)

## 2021-03-29 LAB — HCG, QUANTITATIVE, PREGNANCY: hCG, Beta Chain, Quant, S: <1 m[IU]/mL (ref <5)

## 2021-03-29 LAB — ETHANOL: Alcohol, Ethyl (B): <10 mg/dL (ref <10)

## 2021-03-29 LAB — ACETAMINOPHEN LEVEL: Acetaminophen (Tylenol), Serum: <10 ug/mL — ABNORMAL LOW (ref 10–30)

## 2021-03-29 LAB — SALICYLATE LEVEL: Salicylate Lvl: <7 mg/dL — ABNORMAL LOW (ref 7.0–30.0)

## 2021-03-29 MED ORDER — ALBUTEROL SULFATE HFA 108 (90 BASE) MCG/ACT IN AERS: 2.0000 | INHALATION_SPRAY | Freq: Four times a day (QID) | RESPIRATORY_TRACT | Status: DC | PRN

## 2021-03-29 MED ORDER — ALBUTEROL SULFATE (2.5 MG/3ML) 0.083% IN NEBU: 2.5000 mg | INHALATION_SOLUTION | Freq: Four times a day (QID) | RESPIRATORY_TRACT | Status: DC | PRN

## 2021-03-29 MED ORDER — SODIUM CHLORIDE 0.9 % IV BOLUS (SEPSIS)
1000.0000 mL | Freq: Once | INTRAVENOUS | Status: AC
  Administered 2021-03-29: 1000 mL via INTRAVENOUS

## 2021-03-29 NOTE — ED Notes (Signed)
Poison control notified recommending initial EKG be obtained, place patient on continuous cardiac monitoring, obtain another EKG prior to medical clearance, and monitor for CNS and respiratory depression (observation period 4 hours).

## 2021-03-29 NOTE — ED Provider Notes (Signed)
[facility]-EMERGENCY DEPT Provider Note   CSN: [id number] Arrival date & time: 03/29/21  1929     History No chief complaint on file.   Clea Dubach is a 20 y.o. adult.  Patient states that last night around midnight she took Vistaril to hurt her self.  Then today around 6 PM she took a number of Xanax to herself.   Drug Overdose This is a new problem. The current episode started 3 to 5 hours ago. The problem occurs rarely. The problem has been resolved. Pertinent negatives include no chest pain, no abdominal pain and no headaches. Nothing aggravates the symptoms. Nothing relieves the symptoms. Danely Husmann "[name]" has tried nothing for the symptoms.      Past Medical History:  Diagnosis Date   Asthma     Patient Active Problem List   Diagnosis Date Noted   Vitamin D deficiency 09/15/2020    History reviewed. No pertinent surgical history.   OB History   No obstetric history on file.     History reviewed. No pertinent family history.  Social History   Tobacco Use   Smoking status: Never   Smokeless tobacco: Never  Substance Use Topics   Alcohol use: Not Currently   Drug use: Not Currently    Home Medications Prior to Admission medications   Medication Sig Start Date End Date Taking? Authorizing Provider  albuterol (VENTOLIN HFA) 108 (90 Base) MCG/ACT inhaler Inhale 2 puffs into the lungs every 6 (six) hours as needed for wheezing or shortness of breath. 11/23/20  Yes Couture, Cortni S, PA-C  fluticasone (FLONASE) 50 MCG/ACT nasal spray Place 2 sprays into both nostrils daily. 11/23/20  Yes Couture, Cortni S, PA-C  ibuprofen (ADVIL) 600 MG tablet Take 1 tablet (600 mg total) by mouth every 6 (six) hours as needed. 08/29/20  Yes Mare Ferrari, PA-C  levocetirizine (XYZAL) 5 MG tablet Take 1 tablet (5 mg total) by mouth every evening. 11/23/20 03/29/21 Yes Couture, Cortni S, PA-C  Glucosamine Sulfate 1000 MG CAPS Take 1 capsule (1,000 mg total) by  mouth 2 (two) times daily. Patient not taking: Reported on 03/29/2021 06/03/19   Hilts, Casimiro Needle, MD  methocarbamol (ROBAXIN) 500 MG tablet Take 1 tablet (500 mg total) by mouth 2 (two) times daily. Patient not taking: Reported on 03/29/2021 08/29/20   Mare Ferrari, PA-C    Allergies    Patient has no known allergies.  Review of Systems   Review of Systems  Constitutional:  Negative for appetite change and fatigue.  HENT:  Negative for congestion, ear discharge and sinus pressure.   Eyes:  Negative for discharge.  Respiratory:  Negative for cough.   Cardiovascular:  Negative for chest pain.  Gastrointestinal:  Negative for abdominal pain and diarrhea.  Genitourinary:  Negative for frequency and hematuria.  Musculoskeletal:  Negative for back pain.  Skin:  Negative for rash.  Neurological:  Negative for seizures and headaches.  Psychiatric/Behavioral:  Negative for hallucinations.        Suicidal   Physical Exam Updated Vital Signs BP 113/78   Pulse 95   Temp 98.5 F (36.9 C) (Oral)   Resp 18   SpO2 100%   Physical Exam Vitals and nursing note reviewed.  Constitutional:      Appearance: Jarita Thal "[name]" is well-developed.  HENT:     Head: Normocephalic.     Nose: Nose normal.  Eyes:     General: No scleral icterus.    Conjunctiva/sclera: Conjunctivae  normal.  Neck:     Thyroid: No thyromegaly.  Cardiovascular:     Rate and Rhythm: Normal rate and regular rhythm.     Heart sounds: No murmur heard.   No friction rub. No gallop.  Pulmonary:     Breath sounds: No stridor. No wheezing or rales.  Chest:     Chest wall: No tenderness.  Abdominal:     General: There is no distension.     Tenderness: There is no abdominal tenderness. There is no rebound.  Musculoskeletal:        General: Normal range of motion.     Cervical back: Neck supple.  Lymphadenopathy:     Cervical: No cervical adenopathy.  Skin:    Findings: No erythema or rash.  Neurological:     Mental  Status: Ynez Zahniser "[name]" is oriented to person, place, and time.     Motor: No abnormal muscle tone.     Coordination: Coordination normal.  Psychiatric:        Behavior: Behavior normal.     Comments: Suicidal    ED Results / Procedures / Treatments   Labs (all labs ordered are listed, but only abnormal results are displayed) Labs Reviewed  CBC WITH DIFFERENTIAL/PLATELET  COMPREHENSIVE METABOLIC PANEL  ACETAMINOPHEN LEVEL  SALICYLATE LEVEL  ETHANOL  RAPID URINE DRUG SCREEN, HOSP PERFORMED    EKG None  Radiology No results found.  Procedures Procedures   Medications Ordered in ED Medications  sodium chloride 0.9 % bolus 1,000 mL (1,000 mLs Intravenous New Bag/Given 03/29/21 2134)    ED Course  I have reviewed the triage vital signs and the nursing notes.  Pertinent labs & imaging results that were available during my care of the patient were reviewed by me and considered in my medical decision making (see chart for details). Patient is medically cleared and can be dispositioned by behavioral health   MDM Rules/Calculators/A&P                         Patient with suicidal ideation and ingestion.  She will be seen by behavioral health Final Clinical Impression(s) / ED Diagnoses Final diagnoses:  Suicidal ideation    Rx / DC Orders ED Discharge Orders     None        Bethann Berkshire, MD 03/29/21 2314

## 2021-03-29 NOTE — BH Assessment (Signed)
Clinician messaged Waymond Cera, RN to ask if the pt was medically cleared and if Poison Control was notified about the pt's overdose. Per RN note: "Patient arrives via EMS from home after taking un known amount of hydroxyzine yesterday. Patient reports then taking an unknown amount of xanax today around 6pm, patient endorses SI."   Clinician awaiting response.  Redmond Pulling, MS, [facility], [facility] Triage Specialist [phone]

## 2021-03-29 NOTE — ED Triage Notes (Signed)
Patient arrives via EMS from home after taking un known amount of hydroxyzine yesterday. Patient reports then taking an unknown amount of xanax today around 6pm, patient endorses SI.    HR 118 ST BP 124/82 CBG 99 20 g LAC

## 2021-03-30 ENCOUNTER — Other Ambulatory Visit: Payer: Self-pay

## 2021-03-30 ENCOUNTER — Inpatient Hospital Stay (HOSPITAL_COMMUNITY)
Admission: AD | Admit: 2021-03-30 | Discharge: 2021-04-05 | DRG: 918 | Disposition: A | Discharge status: Home / Self Care | Payer: Medicaid Other | Source: Intra-hospital | Attending: Psychiatry | Admitting: Psychiatry

## 2021-03-30 ENCOUNTER — Encounter (HOSPITAL_COMMUNITY): Payer: Self-pay | Admitting: Psychiatry

## 2021-03-30 DIAGNOSIS — Z818 Family history of other mental and behavioral disorders: Secondary | ICD-10-CM | POA: Diagnosis not present

## 2021-03-30 DIAGNOSIS — Z20822 Contact with and (suspected) exposure to covid-19: Secondary | ICD-10-CM | POA: Diagnosis present

## 2021-03-30 DIAGNOSIS — F333 Major depressive disorder, recurrent, severe with psychotic symptoms: Secondary | ICD-10-CM | POA: Insufficient documentation

## 2021-03-30 DIAGNOSIS — F1020 Alcohol dependence, uncomplicated: Secondary | ICD-10-CM | POA: Insufficient documentation

## 2021-03-30 DIAGNOSIS — Z9151 Personal history of suicidal behavior: Secondary | ICD-10-CM

## 2021-03-30 DIAGNOSIS — F1220 Cannabis dependence, uncomplicated: Secondary | ICD-10-CM | POA: Insufficient documentation

## 2021-03-30 DIAGNOSIS — F1210 Cannabis abuse, uncomplicated: Secondary | ICD-10-CM | POA: Diagnosis present

## 2021-03-30 DIAGNOSIS — T424X2A Poisoning by benzodiazepines, intentional self-harm, initial encounter: Secondary | ICD-10-CM | POA: Diagnosis not present

## 2021-03-30 DIAGNOSIS — T43592A Poisoning by other antipsychotics and neuroleptics, intentional self-harm, initial encounter: Secondary | ICD-10-CM | POA: Diagnosis not present

## 2021-03-30 DIAGNOSIS — J45909 Unspecified asthma, uncomplicated: Secondary | ICD-10-CM | POA: Diagnosis present

## 2021-03-30 LAB — RESP PANEL BY RT-PCR (FLU A&B, COVID) ARPGX2
Influenza A by PCR: NEGATIVE
Influenza B by PCR: NEGATIVE
SARS Coronavirus 2 by RT PCR: NEGATIVE

## 2021-03-30 LAB — RAPID URINE DRUG SCREEN, HOSP PERFORMED
Amphetamines: NOT DETECTED
Barbiturates: NOT DETECTED
Benzodiazepines: POSITIVE — AB
Cocaine: NOT DETECTED
Opiates: NOT DETECTED
Tetrahydrocannabinol: POSITIVE — AB

## 2021-03-30 MED ORDER — ADULT MULTIVITAMIN W/MINERALS CH
1.0000 | ORAL_TABLET | Freq: Every day | ORAL | Status: DC
  Administered 2021-03-30 – 2021-04-05 (×7): 1 via ORAL
  Filled 2021-03-30 (×9): qty 1

## 2021-03-30 MED ORDER — MAGNESIUM HYDROXIDE 400 MG/5ML PO SUSP: 5.0000 mL | Freq: Every day | ORAL | Status: DC | PRN

## 2021-03-30 MED ORDER — ACETAMINOPHEN 325 MG PO TABS
650.0000 mg | ORAL_TABLET | Freq: Four times a day (QID) | ORAL | Status: DC | PRN
  Administered 2021-03-31 (×2): 650 mg via ORAL
  Filled 2021-03-30 (×2): qty 2

## 2021-03-30 MED ORDER — LORAZEPAM 1 MG PO TABS: 1.0000 mg | ORAL_TABLET | Freq: Four times a day (QID) | ORAL | Status: AC | PRN

## 2021-03-30 MED ORDER — HYDROXYZINE HCL 25 MG PO TABS: 25.0000 mg | ORAL_TABLET | Freq: Four times a day (QID) | ORAL | Status: AC | PRN

## 2021-03-30 MED ORDER — SERTRALINE HCL 25 MG PO TABS
25.0000 mg | ORAL_TABLET | Freq: Every day | ORAL | Status: DC
  Administered 2021-03-30 – 2021-03-31 (×2): 25 mg via ORAL
  Filled 2021-03-30 (×4): qty 1

## 2021-03-30 MED ORDER — LOPERAMIDE HCL 2 MG PO CAPS: 2.0000 mg | ORAL_CAPSULE | ORAL | Status: AC | PRN

## 2021-03-30 MED ORDER — ONDANSETRON 4 MG PO TBDP: 4.0000 mg | ORAL_TABLET | Freq: Four times a day (QID) | ORAL | Status: AC | PRN

## 2021-03-30 MED ORDER — QUETIAPINE FUMARATE 50 MG PO TABS
50.0000 mg | ORAL_TABLET | Freq: Every day | ORAL | Status: DC
  Administered 2021-03-30: 50 mg via ORAL
  Filled 2021-03-30 (×3): qty 1

## 2021-03-30 MED ORDER — TRAZODONE HCL 50 MG PO TABS
50.0000 mg | ORAL_TABLET | Freq: Every evening | ORAL | Status: DC | PRN
  Administered 2021-03-30: 50 mg via ORAL
  Filled 2021-03-30 (×2): qty 1

## 2021-03-30 MED ORDER — THIAMINE HCL 100 MG PO TABS
100.0000 mg | ORAL_TABLET | Freq: Every day | ORAL | Status: DC
  Administered 2021-03-31 – 2021-04-05 (×6): 100 mg via ORAL
  Filled 2021-03-30 (×8): qty 1

## 2021-03-30 NOTE — BH Assessment (Addendum)
[facility] Assessment Progress Note  Pt accepted to [facility] to the service of Landry Mellow, MD.  Please see note authored by Josephina Gip, LCAS at 04:31.  IVC documents, initiated by EDP Bethann Berkshire, MD, have been faxed to [phone].  EDP Susy Frizzle, MD and pt's nurse, Destiny, are aware of disposition.  Doylene Canning, [place] [facility] Coordinator [phone]
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
## 2021-03-30 NOTE — BH Assessment (Signed)
Patient has been accepted to [facility] at 9:00 am.  Melbourne Abts, PA-C accepting her and Dr. Jola Babinski is attending.  Please call report to [phone] prior to transport.

## 2021-03-30 NOTE — ED Notes (Signed)
Report given to E[place], Charity fundraiser at [facility].

## 2021-03-30 NOTE — ED Provider Notes (Signed)
Emergency Medicine Observation Re-evaluation Note  Adriana Jackson is a 20 y.o. adult, seen on rounds today.  Pt initially presented to the ED for complaints of Suicidal Currently, the patient is resting comfortably.  Physical Exam  BP 113/85   Pulse 79   Temp 98.5 F (36.9 C) (Oral)   Resp 19   SpO2 99%  Physical Exam General: Awake alert Lungs: Resp even and unlabored Psych: Calm cooperative  ED Course / MDM  EKG:   I have reviewed the labs performed to date as well as medications administered while in observation.  Recent changes in the last 24 hours include none.  Plan  Current plan is for [facility] Admission. Patient is not under full IVC at this time.   Pollyann Savoy, MD 03/30/21 [phone]
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
## 2021-03-30 NOTE — ED Notes (Signed)
Patient has been accepted to [facility] at 9:00 am.  Cody Taylor, PA-C accepting her and Dr. Clary is attending.  Please call report to [phone] prior to transport.  

## 2021-03-30 NOTE — ED Notes (Signed)
Pt transported to [facility] in police custody with IVC paperwork.

## 2021-03-30 NOTE — ED Notes (Signed)
[facility] contacted for transport and will pick up pt after 9am.

## 2021-03-30 NOTE — Tx Team (Signed)
Initial Treatment Plan 03/30/2021 10:40 AM Adriana Jackson [id number]    PATIENT STRESSORS: Health problems Marital or family conflict Substance abuse   PATIENT STRENGTHS: Ability for insight Average or above average intelligence Motivation for treatment/growth Physical Health Supportive family/friends   PATIENT IDENTIFIED PROBLEMS: Suicidal ideations  anxiety  depression  hopelessness  Substance use/abuse  Past truama           DISCHARGE CRITERIA:  Ability to meet basic life and health needs Improved stabilization in mood, thinking, and/or behavior Motivation to continue treatment in a less acute level of care Need for constant or close observation no longer present  PRELIMINARY DISCHARGE PLAN: Attend aftercare/continuing care group Attend 12-step recovery group Outpatient therapy Return to previous living arrangement Return to previous work or school arrangements  PATIENT/FAMILY INVOLVEMENT: This treatment plan has been presented to and reviewed with the patient, Adriana Jackson.  The patient and family have been given the opportunity to ask questions and make suggestions.  Raylene Miyamoto, RN 03/30/2021, 10:40 AM

## 2021-03-30 NOTE — Progress Notes (Signed)
Patient's 03/29/21 EKG reviewed, which shows ventricular rate 94 bpm, PR interval 142 ms, QRS 92 ms, and QT/QTC 350/438 ms. Reviewed patient's 03/29/21 EKG with Dr. Read [facility] ED) via phone, who confirmed that patient's EKG shows no acute or concerning findings.

## 2021-03-30 NOTE — ED Notes (Signed)
Attempted to call report, [facility] is unable to take report and will return call.

## 2021-03-30 NOTE — [facility] Counselor (Signed)
Type of Therapy and Topic:  Group Therapy:  Setting Goals   Participation Level:  Did Not Attend   Description of Group: In this process group, patients discussed using strengths to work toward goals and address challenges.  Patients identified two positive things about themselves and one goal they were working on.  Patients were given the opportunity to share openly and support each other's plan for self-empowerment.  The group discussed the value of gratitude and were encouraged to have a daily reflection of positive characteristics or circumstances.  Patients were encouraged to identify a plan to utilize their strengths to work on current challenges and goals.   Therapeutic Goals Patient will verbalize personal strengths/positive qualities and relate how these can assist with achieving desired personal goals Patients will verbalize affirmation of peers plans for personal change and goal setting Patients will explore the value of gratitude and positive focus as related to successful achievement of goals Patients will verbalize a plan for regular reinforcement of personal positive qualities and circumstances.   Summary of Patient Progress: Did Not Attend   Ivelisse Culverhouse, LCSWA Clinicial Social Worker  Health  
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
## 2021-03-31 LAB — LIPID PANEL
Cholesterol: 163 mg/dL (ref 0–200)
HDL: 55 mg/dL (ref >40)
LDL Cholesterol: 94 mg/dL (ref 0–99)
Total CHOL/HDL Ratio: 3 RATIO
Triglycerides: 71 mg/dL (ref <150)
VLDL: 14 mg/dL (ref 0–40)

## 2021-03-31 LAB — TSH: TSH: 2.618 u[IU]/mL (ref 0.350–4.500)

## 2021-03-31 LAB — HEMOGLOBIN A1C
Hgb A1c MFr Bld: 5.1 % (ref 4.8–5.6)
Mean Plasma Glucose: 99.67 mg/dL

## 2021-03-31 MED ORDER — QUETIAPINE FUMARATE 100 MG PO TABS
100.0000 mg | ORAL_TABLET | Freq: Every day | ORAL | Status: DC
  Administered 2021-03-31 – 2021-04-01 (×2): 100 mg via ORAL
  Filled 2021-03-31 (×4): qty 1
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
## 2021-03-31 NOTE — Progress Notes (Signed)
   03/30/21 2358  Psych Admission Type (Psych Patients Only)  Admission Status Involuntary  Psychosocial Assessment  Patient Complaints Anxiety;Depression  Eye Contact Fair  Facial Expression Anxious;Sullen  Affect Depressed  Speech Logical/coherent  Interaction Guarded  Motor Activity Other (Comment) (WNL)  Appearance/Hygiene Unremarkable  Behavior Characteristics Cooperative;Appropriate to situation  Mood Depressed;Anxious  Thought Process  Coherency WDL  Content Blaming others  Delusions None reported or observed  Perception Hallucinations  Hallucination Auditory  Judgment Poor  Confusion None  Danger to Self  Current suicidal ideation? Denies  Self-Injurious Behavior No self-injurious ideation or behavior indicators observed or expressed   Agreement Not to Harm Self Yes  Description of Agreement pt agrees to approach staff before harming self/others while at [facility]  Danger to Others  Danger to Others None reported or observed  D: Patient mood and affect appears sad and depressed. Cooperative with assessment.  A: Medications administered as prescribed. Support and encouragement provided as needed.  R: Patient remains safe on the unit. Will continue to monitor for safety and stability.

## 2021-03-31 NOTE — Progress Notes (Addendum)
[facility] Medical Student Progress Note    Patient Name: Adriana Jackson" MRN: [id number]  DOB: April 15, 2001  Subjective:  Patient states they are doing "okay" this morning. They have been able to get some rest, and their appetite is better than when they came in. They deny SI, HI, and AVH. They did endorse a headache that has been constant overnight, but they were going to go get some Tylenol after the interview. They reported no other pain or concerns. They did not attend group this morning because they were too tired because of their medication. They reported no other medication effects. They took the Mood Disorder Questionnaire and reported they had never thought of their symptoms specifically like the questions asked and felt validated in their feelings.  Past Psychiatric History: Chronic depression and multiple suicide attempts  Past Medical History: Past Medical History:  Diagnosis Date   Asthma     Family History: History reviewed. No pertinent family history.   Current Medications:  Current Facility-Administered Medications:    acetaminophen (TYLENOL) tablet 650 mg, 650 mg, Oral, Q6H PRN, Mason Jim, Chou Busler E, MD, 650 mg at 03/31/21 0954   hydrOXYzine (ATARAX/VISTARIL) tablet 25 mg, 25 mg, Oral, Q6H PRN, Mason Jim, Geneen Dieter E, MD   loperamide (IMODIUM) capsule 2-4 mg, 2-4 mg, Oral, PRN, Mason Jim, Kapono Luhn E, MD   LORazepam (ATIVAN) tablet 1 mg, 1 mg, Oral, Q6H PRN, Mason Jim, Giordano Getman E, MD   magnesium hydroxide (MILK OF MAGNESIA) suspension 5 mL, 5 mL, Oral, Daily PRN, Mason Jim, Necole Minassian E, MD   multivitamin with minerals tablet 1 tablet, 1 tablet, Oral, Daily, Mason Jim, Ambria Mayfield E, MD, 1 tablet at 03/31/21 0748   ondansetron (ZOFRAN-ODT) disintegrating tablet 4 mg, 4 mg, Oral, Q6H PRN, Mason Jim, Ivry Pigue E, MD   QUEtiapine (SEROQUEL) tablet 100 mg, 100 mg, Oral, QHS, Pashayan, Mardelle Matte, MD   sertraline (ZOLOFT) tablet 25 mg, 25 mg, Oral, QHS, Danay Mckellar E, MD, 25 mg at 03/30/21 2149   thiamine tablet  100 mg, 100 mg, Oral, Daily, Joycelyn Liska E, MD, 100 mg at 03/31/21 0748   traZODone (DESYREL) tablet 50 mg, 50 mg, Oral, QHS PRN, Comer Locket, MD, 50 mg at 03/30/21 2148  Social History: Social History   Socioeconomic History   Marital status: Single    Spouse name: Not on file   Number of children: Not on file   Years of education: Not on file   Highest education level: Not on file  Occupational History   Not on file  Tobacco Use   Smoking status: Never   Smokeless tobacco: Never  Vaping Use   Vaping Use: Never used  Substance and Sexual Activity   Alcohol use: Yes    Alcohol/week: 4.0 standard drinks    Types: 4 Shots of liquor per week    Comment: 4 shots/day   Drug use: Yes    Types: Marijuana    Comment: delta 8   Sexual activity: Yes  Other Topics Concern   Not on file  Social History Narrative   Not on file   Social Determinants of Health   Financial Resource Strain: Not on file  Food Insecurity: Not on file  Transportation Needs: Not on file  Physical Activity: Not on file  Stress: Not on file  Social Connections: Not on file    Review of Systems: CON: No fever, chills HEENT: Positive for headache CAR: No chest discomfort GI: No nausea, vomiting, diarrhea, constipation GU: No urinary discomfort, incontinence, or retention  Objective: Psychiatric  Specialty Exam: Blood pressure 105/68, pulse (!) 114, temperature 98.2 F (36.8 C), temperature source Oral, resp. rate 18, height 5\' 7"  (1.702 m), weight 58.1 kg, SpO2 98 %.Body mass index is 20.05 kg/m.  General Appearance: Casual and Fairly Groomed  Eye Contact:  Good  Speech:  Clear and Coherent  Volume:  Normal  Mood:   "Okay"  Affect:  Depressed and Flat  Thought Process:  Coherent and Goal Directed  Orientation:  Full (Time, Place, and Person)  Thought Content:  WDL  Suicidal Thoughts:  No  Homicidal Thoughts:  No  Memory:  Recent;   Good Remote;   Good  Judgement:  Fair  Insight:   Fair  Psychomotor Activity:  Normal  Concentration:  Concentration: Good  Recall:  NA  Fund of Knowledge:  Good  Language:  Good  Akathisia:  No  Handed:  Right  AIMS (if indicated): As below  Assets:  Resilience Vocational/Educational  ADL's:  Intact  Cognition:  WNL  Sleep:  Number of Hours: 6.75   Physical Exam: GEN: Well developed, sitting up in bed, in NAD HEAD: NCAT, neck supple, hair is blue on right side and blonde on right side RESP: Breathing comfortably on RA EXT: Moves all extremities equally  AIMS: -Facial and Oral Movements Muscles of Facial Expression: None, normal Lips and Perioral Area: None, normal Jaw: None, normal Tongue: None, normal -Extremity Movements Upper (arms, wrists, hands, fingers): None, normal Lower (legs, knees, ankles, toes): None, normal -Trunk Movements Neck, shoulders, hips: None, normal -Overall Severity Severity of abnormal movements (highest score from questions above): None, normal Incapacitation due to abnormal movements: None, normal Patient's awareness of abnormal movements (rate only patient's report): No awareness -Dental Status Current problems with teeth and/or dentures?: No Does patient usually wear dentures?: No  CIWA: 3 this morning COWS: NA  Musculoskeletal: Strength & Muscle Tone: Normal Gait & Station: NA Patient leans: Normal, leaned to write on MDQ  Labs: Complete Metabolic Panel: BMP Latest Ref Rng & Units 03/29/2021 08/29/2020 12/24/2019  Glucose 70 - 99 mg/dL 93 94 12/26/2019)  BUN 6 - 20 mg/dL 12 14 11   Creatinine 0.44 - 1.00 mg/dL 858(I 5.02  BUN/Creat Ratio 6 - 22 (calc) - - -  Sodium 135 - 145 mmol/L 141 139 139  Potassium 3.5 - 5.1 mmol/L 3.5 3.5 3.5  Chloride 98 - 111 mmol/L 107 104 102  CO2 22 - 32 mmol/L 27 26 25   Calcium 8.9 - 10.3 mg/dL 9.2 9.6 9.3    Basic Metabolic Panel: CMP Latest Ref Rng & Units 03/29/2021 08/29/2020 12/24/2019  Glucose 70 - 99 mg/dL 93 94 05/30/2021)  BUN  6 - 20 mg/dL 12 14 11   Creatinine 0.44 - 1.00 mg/dL 14/01/2020 12/26/2019 786(V  Sodium 135 - 145 mmol/L 141 139 139  Potassium 3.5 - 5.1 mmol/L 3.5 3.5 3.5  Chloride 98 - 111 mmol/L 107 104 102  CO2 22 - 32 mmol/L 27 26 25   Calcium 8.9 - 10.3 mg/dL 9.2 9.6 9.3  Total Protein 6.5 - 8.1 g/dL 7.4 - -  Total Bilirubin 0.3 - 1.2 mg/dL 0.5 - -  Alkaline Phos 38 - 126 U/L 54 - -  AST 15 - 41 U/L 17 - -  ALT 0 - 44 U/L 10 - -    Complete Blood Count: CBC Latest Ref Rng & Units 03/29/2021 08/29/2020 12/24/2019  WBC 4.0 - 10.5 K/uL 8.3 8.8 13.3(H)  Hemoglobin 12.0 - 15.0 g/dL 7.09 14.0  13.1  Hematocrit 36.0 - 46.0 % 37.9 41.4 39.7  Platelets 150 - 400 K/uL 280 335 330   Drugs of Abuse:     Component Value Date/Time   LABOPIA NONE DETECTED 03/30/2021 0740   COCAINSCRNUR NONE DETECTED 03/30/2021 0740   LABBENZ POSITIVE (A) 03/30/2021 0740   AMPHETMU NONE DETECTED 03/30/2021 0740   THCU POSITIVE (A) 03/30/2021 0740   LABBARB NONE DETECTED 03/30/2021 0740     Alcohol Level:    Component Value Date/Time   ETH <10 03/29/2021 2134    Lipid Panel:     Component Value Date/Time   CHOL 163 03/31/2021 0635   TRIG 71 03/31/2021 0635   HDL 55 03/31/2021 0635   CHOLHDL 3.0 03/31/2021 0635   VLDL 14 03/31/2021 0635   LDLCALC 94 03/31/2021 0635   LDLCALC 92 06/21/2018 1346    Hgb A1c: 5.1 on 03/31/21  hCG, quantitative, pregnancy: Negative, <1  Salicylate level: <7.0  Acetaminophen level: <10  Assessment/Plan:  Principal Problem:   Severe episode of recurrent major depressive disorder, with psychotic features (HCC)  Derra Saylor is a 20 y.o. non-binary adult with a PMH of chronic MDD and multiple suicide attempts who presents for further evaluation after a suicide attempt via overdose with Atarax and Xanax.  #MDD, recurrent, severe, with psychotic features; (r/o bipolar d/o MRE depressed, severe with psychotic features; r/o substance induced mood d/o) Patient states they are doing better today  than they were on admission. They are eating better, sleeping better, and they deny SI/HI/AVH. Their affect, however, remains depressed and flat. They did have a positive result on the MDQ, indicating potential presence of bipolar disorder. Labs were all normal besides UDS indicating presence of benzodiazepines and THC. Due to continued depressed and flat affect, and potential presence of bipolar disorder, will adjust medications accordingly. -Increase Seroquel from 50 mg to 100 mg QHS for mood stabilization and recent paranoia -Continue Zoloft 25 mg QHS with plans to increase dose tomorrow to 50mg  once she has a more adequate mood stabilizer dose on board - TSH 2.618 - Metabolic labs/EKG monitoring while on an atypical antipsychotic: HbgA1c 5.1, Cholesterol 163, triglycerides 71, HDL 55, LDL 94; QTC -Monitor improvement  #Cannabis use disorder; r/o alcohol use disorder Patient has not experienced any withdrawal symptoms such as tremors, delirium, or seizures. Given only hospital day 1, will continue CIWA protocol to ensure management of potential withdrawal. -Continue CIWA with oral thiamine, MVI -Continue Ativan 1 mg Q6H PRN with CIWA >10 (recent CIWA scors 3, 5)  -Continue PRN medications: Tylenol, Maalox, Atarax, Milk of Magnesia, and Trazodone  Signed: , MS3 [facility] 03/31/2021, 12:25 PM

## 2021-03-31 NOTE — [facility] Group Notes (Signed)
[facility] Group Notes:  (Nursing/MHT/Case Management/Adjunct)  Date:  03/31/2021  Time:  9:57 AM  Type of Therapy:  Goals group  Participation Level:  Did Not Attend  Participation Quality:    Affect:    Cognitive:    Insight:    Engagement in Group:    Modes of Intervention:  Discussion and Education  Summary of Progress/Problems:  Did not attend despite staff invitation.  Adriana Jackson V [name] 03/31/2021, 9:57 AM 

## 2021-03-31 NOTE — [facility] Suicide Risk Assessment (Signed)
[facility] INPATIENT:  Family/Significant Other Suicide Prevention Education  Suicide Prevention Education:  Education Completed; Engineer, building services, sister  (name of family member/significant other) has been identified by the patient as the family member/significant other with whom the patient will be residing, and identified as the person(s) who will aid the patient in the event of a mental health crisis (suicidal ideations/suicide attempt).  With written consent from the patient, the family member/significant other has been provided the following suicide prevention education, prior to the and/or following the discharge of the patient.  Patient sister reports that patient lives with her and her family.  Patient sister reports that patient has one suicidal attempt prior to this attempt when she had come down with COVID. Patient sister reports both attempts have been by method of overdose. Sister reports plan to buy a lockbox to secure prescription and other medications in the lockbox. Patient sister understood suicide prevention education and was provided an opportunity to ask questions.  Sister reports that patient gets very depressed. Sister also reports that patient can self sabatoge a lot and discussed that patient will become very dependent on people and then will cut them off for small reasons.  Sister agreed that patient can return home to the house upon discharge.   The suicide prevention education provided includes the following: Suicide risk factors Suicide prevention and interventions National Suicide Hotline telephone number [facility] assessment telephone number [facility] Emergency Assistance 911 [facility] and/or Residential Mobile Crisis Unit telephone number  Request made of family/significant other to: Remove weapons (e.g., guns, rifles, knives), all items previously/currently identified as safety concern.   Remove drugs/medications (over-the-counter, prescriptions,  illicit drugs), all items previously/currently identified as a safety concern.  The family member/significant other verbalizes understanding of the suicide prevention education information provided.  The family member/significant other agrees to remove the items of safety concern listed above.  Ozias Dicenzo E Daleen Steinhaus 03/31/2021, 3:29 PM

## 2021-03-31 NOTE — Plan of Care (Signed)
  Problem: Education: Goal: Ability to state activities that reduce stress will improve Outcome: Progressing   Problem: Coping: Goal: Ability to identify and develop effective coping behavior will improve Outcome: Progressing   Problem: Self-Concept: Goal: Ability to identify factors that promote anxiety will improve Outcome: Progressing   

## 2021-03-31 NOTE — Progress Notes (Signed)
Progress note  Pt found in bed; compliant with medication pass. Pt had complaints of a headache they rated a 10/10. Medication provided. Pt seemed like they were expressing allergy symptoms which they state they have. Pt continues to be minimal and guarded. Pt is pleasant. Pt denies si/hi/ah/vh and verbally agrees to approach staff if these become apparent or before harming themselves/others while at [facility].  A: Pt provided support and encouragement. Pt given medication per protocol and standing orders. Q61m safety checks implemented and continued.  R: Pt safe on the unit. Will continue to monitor.

## 2021-04-01 MED ORDER — SERTRALINE HCL 50 MG PO TABS
50.0000 mg | ORAL_TABLET | Freq: Every day | ORAL | Status: DC
  Administered 2021-04-01 – 2021-04-04 (×4): 50 mg via ORAL
  Filled 2021-04-01 (×6): qty 1

## 2021-04-01 MED ORDER — ALBUTEROL SULFATE HFA 108 (90 BASE) MCG/ACT IN AERS: 2.0000 | INHALATION_SPRAY | Freq: Four times a day (QID) | RESPIRATORY_TRACT | Status: DC | PRN

## 2021-04-01 NOTE — Plan of Care (Signed)
  Problem: Self-Concept: Goal: Level of anxiety will decrease Outcome: Progressing Goal: Ability to modify response to factors that promote anxiety will improve Outcome: Progressing   Problem: Education: Goal: Utilization of techniques to improve thought processes will improve Outcome: Progressing   

## 2021-04-01 NOTE — Tx Team (Signed)
Interdisciplinary Treatment and Diagnostic Plan Update  04/01/2021 Time of Session: 9:40am Adriana Jackson MRN: [id number]  Principal Diagnosis: Severe episode of recurrent major depressive disorder, with psychotic features (HCC)  Secondary Diagnoses: Principal Problem:   Severe episode of recurrent major depressive disorder, with psychotic features (HCC)   Current Medications:  Current Facility-Administered Medications  Medication Dose Route Frequency Provider Last Rate Last Admin   acetaminophen (TYLENOL) tablet 650 mg  650 mg Oral Q6H PRN Comer Locket, MD   650 mg at 03/31/21 1604   hydrOXYzine (ATARAX/VISTARIL) tablet 25 mg  25 mg Oral Q6H PRN Comer Locket, MD       loperamide (IMODIUM) capsule 2-4 mg  2-4 mg Oral PRN Mason Jim, Amy E, MD       LORazepam (ATIVAN) tablet 1 mg  1 mg Oral Q6H PRN Mason Jim, Amy E, MD       magnesium hydroxide (MILK OF MAGNESIA) suspension 5 mL  5 mL Oral Daily PRN Comer Locket, MD       multivitamin with minerals tablet 1 tablet  1 tablet Oral Daily Mason Jim, Amy E, MD   1 tablet at 04/01/21 0736   ondansetron (ZOFRAN-ODT) disintegrating tablet 4 mg  4 mg Oral Q6H PRN Comer Locket, MD       QUEtiapine (SEROQUEL) tablet 100 mg  100 mg Oral QHS Lauro Franklin, MD   100 mg at 03/31/21 2111   sertraline (ZOLOFT) tablet 25 mg  25 mg Oral QHS Mason Jim, Amy E, MD   25 mg at 03/31/21 2111   thiamine tablet 100 mg  100 mg Oral Daily Mason Jim, Amy E, MD   100 mg at 04/01/21 0736   traZODone (DESYREL) tablet 50 mg  50 mg Oral QHS PRN Comer Locket, MD   50 mg at 03/30/21 2148   PTA Medications: Medications Prior to Admission  Medication Sig Dispense Refill Last Dose   albuterol (VENTOLIN HFA) 108 (90 Base) MCG/ACT inhaler Inhale 2 puffs into the lungs every 6 (six) hours as needed for wheezing or shortness of breath. 8 g 0     Patient Stressors: Health problems Marital or family conflict Substance abuse  Patient Strengths: Ability  for insight Average or above average intelligence Motivation for treatment/growth Physical Health Supportive family/friends  Treatment Modalities: Medication Management, Group therapy, Case management,  1 to 1 session with clinician, Psychoeducation, Recreational therapy.   Physician Treatment Plan for Primary Diagnosis: Severe episode of recurrent major depressive disorder, with psychotic features (HCC) Long Term Goal(s): Improvement in symptoms so as ready for discharge   Short Term Goals: Ability to identify changes in lifestyle to reduce recurrence of condition will improve Ability to verbalize feelings will improve Ability to disclose and discuss suicidal ideas Ability to demonstrate self-control will improve Ability to identify and develop effective coping behaviors will improve Ability to identify triggers associated with substance abuse/mental health issues will improve Ability to maintain clinical measurements within normal limits will improve  Medication Management: Evaluate patient's response, side effects, and tolerance of medication regimen.  Therapeutic Interventions: 1 to 1 sessions, Unit Group sessions and Medication administration.  Evaluation of Outcomes: Progressing  Physician Treatment Plan for Secondary Diagnosis: Principal Problem:   Severe episode of recurrent major depressive disorder, with psychotic features (HCC)  Long Term Goal(s): Improvement in symptoms so as ready for discharge   Short Term Goals: Ability to identify changes in lifestyle to reduce recurrence of condition will improve Ability to verbalize feelings will improve  Ability to disclose and discuss suicidal ideas Ability to demonstrate self-control will improve Ability to identify and develop effective coping behaviors will improve Ability to identify triggers associated with substance abuse/mental health issues will improve Ability to maintain clinical measurements within normal limits  will improve     Medication Management: Evaluate patient's response, side effects, and tolerance of medication regimen.  Therapeutic Interventions: 1 to 1 sessions, Unit Group sessions and Medication administration.  Evaluation of Outcomes: Progressing   RN Treatment Plan for Primary Diagnosis: Severe episode of recurrent major depressive disorder, with psychotic features (HCC) Long Term Goal(s): Knowledge of disease and therapeutic regimen to maintain health will improve  Short Term Goals: Ability to remain free from injury will improve, Ability to verbalize frustration and anger appropriately will improve, Ability to demonstrate self-control, Ability to identify and develop effective coping behaviors will improve, and Compliance with prescribed medications will improve  Medication Management: RN will administer medications as ordered by provider, will assess and evaluate patient's response and provide education to patient for prescribed medication. RN will report any adverse and/or side effects to prescribing provider.  Therapeutic Interventions: 1 on 1 counseling sessions, Psychoeducation, Medication administration, Evaluate responses to treatment, Monitor vital signs and CBGs as ordered, Perform/monitor CIWA, COWS, AIMS and Fall Risk screenings as ordered, Perform wound care treatments as ordered.  Evaluation of Outcomes: Progressing   LCSW Treatment Plan for Primary Diagnosis: Severe episode of recurrent major depressive disorder, with psychotic features (HCC) Long Term Goal(s): Safe transition to appropriate next level of care at discharge, Engage patient in therapeutic group addressing interpersonal concerns.  Short Term Goals: Engage patient in aftercare planning with referrals and resources, Increase social support, Increase ability to appropriately verbalize feelings, Increase emotional regulation, Identify triggers associated with mental health/substance abuse issues, and Increase  skills for wellness and recovery  Therapeutic Interventions: Assess for all discharge needs, 1 to 1 time with Social worker, Explore available resources and support systems, Assess for adequacy in community support network, Educate family and significant other(s) on suicide prevention, Complete Psychosocial Assessment, Interpersonal group therapy.  Evaluation of Outcomes: Progressing   Progress in Treatment: Attending groups: No. Participating in groups: No. Taking medication as prescribed: Yes. Toleration medication: Yes. Family/Significant other contact made: Yes, individual(s) contacted:  with sister Patient understands diagnosis: Yes. Discussing patient identified problems/goals with staff: Yes. Medical problems stabilized or resolved: Yes. Denies suicidal/homicidal ideation: Yes. Issues/concerns per patient self-inventory: No.   New problem(s) identified: No, Describe:  none  New Short Term/Long Term Goal(s):medication stabilization, elimination of SI thoughts, development of comprehensive mental wellness plan.    Patient Goals:  Did not attend  Discharge Plan or Barriers: Pt is to be set up with therapy and medication management at discharge.  Reason for Continuation of Hospitalization: Anxiety Depression Medication stabilization Suicidal ideation  Estimated Length of Stay: 3-5 days  Attendees: Patient: Did not attend 04/01/2021   Physician:  04/01/2021   Nursing:  04/01/2021   RN Care Manager: 04/01/2021   Social Worker: Ruthann Cancer, LCSW 04/01/2021   Recreational Therapist:  04/01/2021   Other:  04/01/2021   Other:  04/01/2021   Other: 04/01/2021     Scribe for Treatment Team: Otelia Santee, LCSW 04/01/2021 10:16 AM

## 2021-04-01 NOTE — [facility] Group Notes (Signed)
[facility] Group Notes:  (Nursing/MHT/Case Management/Adjunct)  Date:  04/01/2021  Time:  7:14 PM  Type of Therapy:   Goal and orientation group  Participation Level:  Active  Participation Quality:  Appropriate and Attentive  Affect:  Blunted  Cognitive:  Alert and Appropriate  Insight:  Improving  Engagement in Group:  Improving  Modes of Intervention:  Discussion and Education  Summary of Progress/Problems:  She reported that her goal for today is "go to more groups."  She slept ok.  Norm Parcel Doha Boling 04/01/2021, 7:14 PM

## 2021-04-01 NOTE — Progress Notes (Signed)
[facility] MD Progress Note  04/01/2021 10:56 AM Adriana Jackson  MRN:  [id number] Subjective:   Adriana Jackson is a non-binary female (they/them pronouns) who presents under IVC for suicide attempt via Overdose (Atarax and Xanax). PPHx is significant for Depression and multiple suicide attempts.   They report that their mood has improved. They report that they are a little more tired than normal this morning but otherwise tolerated the increase in Seroquel. They report that they had a good nights sleep. They report that their appetite is good. They report no SI, HI, or AVH. They report that they have spoken with their parents and can return home with them when discharged. They report getting along well with other patients on the unit. Discussed with them that the Zoloft will be increased tonight and they are agreeable to this. They have no other concerns at present.   Principal Problem: Severe episode of recurrent major depressive disorder, with psychotic features (HCC) Diagnosis: Principal Problem:   Severe episode of recurrent major depressive disorder, with psychotic features (HCC)  Total Time spent with patient: 30 minutes  Past Psychiatric History: Depression, multiple suicide attempts.  Past Medical History:  Past Medical History:  Diagnosis Date   Asthma    History reviewed. No pertinent surgical history. Family History: History reviewed. No pertinent family history. Family Psychiatric  History: None Reported Social History:  Social History   Substance and Sexual Activity  Alcohol Use Yes   Alcohol/week: 4.0 standard drinks   Types: 4 Shots of liquor per week   Comment: 4 shots/day     Social History   Substance and Sexual Activity  Drug Use Yes   Types: Marijuana   Comment: delta 8    Social History   Socioeconomic History   Marital status: Single    Spouse name: Not on file   Number of children: Not on file   Years of education: Not on file   Highest education  level: Not on file  Occupational History   Not on file  Tobacco Use   Smoking status: Never   Smokeless tobacco: Never  Vaping Use   Vaping Use: Never used  Substance and Sexual Activity   Alcohol use: Yes    Alcohol/week: 4.0 standard drinks    Types: 4 Shots of liquor per week    Comment: 4 shots/day   Drug use: Yes    Types: Marijuana    Comment: delta 8   Sexual activity: Yes  Other Topics Concern   Not on file  Social History Narrative   Not on file   Social Determinants of Health   Financial Resource Strain: Not on file  Food Insecurity: Not on file  Transportation Needs: Not on file  Physical Activity: Not on file  Stress: Not on file  Social Connections: Not on file   Additional Social History:                         Sleep: Good  Appetite:  Good  Current Medications: Current Facility-Administered Medications  Medication Dose Route Frequency Provider Last Rate Last Admin   acetaminophen (TYLENOL) tablet 650 mg  650 mg Oral Q6H PRN Bartholomew Crews E, MD   650 mg at 03/31/21 1604   hydrOXYzine (ATARAX/VISTARIL) tablet 25 mg  25 mg Oral Q6H PRN Bartholomew Crews E, MD       loperamide (IMODIUM) capsule 2-4 mg  2-4 mg Oral PRN Comer Locket, MD  LORazepam (ATIVAN) tablet 1 mg  1 mg Oral Q6H PRN Mason JimSingleton, Amy E, MD       magnesium hydroxide (MILK OF MAGNESIA) suspension 5 mL  5 mL Oral Daily PRN Comer LocketSingleton, Amy E, MD       multivitamin with minerals tablet 1 tablet  1 tablet Oral Daily Mason JimSingleton, Amy E, MD   1 tablet at 04/01/21 0736   ondansetron (ZOFRAN-ODT) disintegrating tablet 4 mg  4 mg Oral Q6H PRN Comer LocketSingleton, Amy E, MD       QUEtiapine (SEROQUEL) tablet 100 mg  100 mg Oral QHS Lauro FranklinPashayan, Destin Vinsant S, MD   100 mg at 03/31/21 2111   sertraline (ZOLOFT) tablet 25 mg  25 mg Oral QHS Mason JimSingleton, Amy E, MD   25 mg at 03/31/21 2111   thiamine tablet 100 mg  100 mg Oral Daily Comer LocketSingleton, Amy E, MD   100 mg at 04/01/21 0736   traZODone (DESYREL) tablet  50 mg  50 mg Oral QHS PRN Comer LocketSingleton, Amy E, MD   50 mg at 03/30/21 2148    Lab Results:  Results for orders placed or performed during the hospital encounter of 03/30/21 (from the past 48 hour(s))  Hemoglobin A1c     Status: None   Collection Time: 03/31/21  6:35 AM  Result Value Ref Range   Hgb A1c MFr Bld 5.1 4.8 - 5.6 %    Comment: (NOTE) Pre diabetes:          5.7%-6.4%  Diabetes:              >6.4%  Glycemic control for   <7.0% adults with diabetes    Mean Plasma Glucose 99.67 mg/dL    Comment: Performed at [facility], [address] [place], [place] [address]  TSH     Status: None   Collection Time: 03/31/21  6:35 AM  Result Value Ref Range   TSH 2.618 0.350 - 4.500 uIU/mL    Comment: Performed by a 3rd Generation assay with a functional sensitivity of <=0.01 uIU/mL. Performed at [facility], [address] [place], [place] [address]   Lipid panel     Status: None   Collection Time: 03/31/21  6:35 AM  Result Value Ref Range   Cholesterol 163 0 - 200 mg/dL   Triglycerides 71 <914<150 mg/dL   HDL 55 >78>40 mg/dL   Total CHOL/HDL Ratio 3.0 RATIO   VLDL 14 0 - 40 mg/dL   LDL Cholesterol 94 0 - 99 mg/dL    Comment:        Total Cholesterol/HDL:CHD Risk Coronary Heart Disease Risk Table                     Men   Women  1/2 Average Risk   3.4   3.3  Average Risk       5.0   4.4  2 X Average Risk   9.6   7.1  3 X Average Risk  23.4   11.0        Use the calculated Patient Ratio above and the CHD Risk Table to determine the patient's CHD Risk.        ATP III CLASSIFICATION (LDL):  <100     mg/dL   Optimal  [id number]  mg/dL   Near or Above                    Optimal  130-159  mg/dL   Borderline  160-189  mg/dL   High  >347     mg/dL   Very High Performed at [facility], [address] [place], [place] [address]     Blood Alcohol level:  Lab Results  Component Value Date   ETH <10 03/29/2021    Metabolic Disorder  Labs: Lab Results  Component Value Date   HGBA1C 5.1 03/31/2021   MPG 99.67 03/31/2021   No results found for: PROLACTIN Lab Results  Component Value Date   CHOL 163 03/31/2021   TRIG 71 03/31/2021   HDL 55 03/31/2021   CHOLHDL 3.0 03/31/2021   VLDL 14 03/31/2021   LDLCALC 94 03/31/2021   LDLCALC 92 06/21/2018    Physical Findings: AIMS: Facial and Oral Movements Muscles of Facial Expression: None, normal Lips and Perioral Area: None, normal Jaw: None, normal Tongue: None, normal,Extremity Movements Upper (arms, wrists, hands, fingers): None, normal Lower (legs, knees, ankles, toes): None, normal, Trunk Movements Neck, shoulders, hips: None, normal, Overall Severity Severity of abnormal movements (highest score from questions above): None, normal Incapacitation due to abnormal movements: None, normal Patient's awareness of abnormal movements (rate only patient's report): No Awareness, Dental Status Current problems with teeth and/or dentures?: No Does patient usually wear dentures?: No  CIWA:  CIWA-Ar Total: 4 COWS:     Musculoskeletal: Strength & Muscle Tone: within normal limits Gait & Station: normal Patient leans: N/A  Psychiatric Specialty Exam:  Presentation  General Appearance: Appropriate for Environment; Fairly Groomed  Eye Contact:Good  Speech:Clear and Coherent; Normal Rate  Speech Volume:Normal  Handedness: No data recorded  Mood and Affect  Mood:Dysphoric  Affect:Appropriate   Thought Process  Thought Processes:Coherent  Descriptions of Associations:Intact  Orientation:Full (Time, Place and Person)  Thought Content:WDL  History of Schizophrenia/Schizoaffective disorder:No  Duration of Psychotic Symptoms:Less than six months  Hallucinations:Hallucinations: None Ideas of Reference:None  Suicidal Thoughts:Suicidal Thoughts: No Homicidal Thoughts:Homicidal Thoughts: No  Sensorium  Memory:Immediate Good; Recent  Good  Judgment:Fair  Insight:Fair   Executive Functions  Concentration:Good  Attention Span:Good  Recall:Good  Fund of Knowledge:Good  Language:Good   Psychomotor Activity  Psychomotor Activity: Psychomotor Activity: Normal  Assets  Assets:Communication Skills; Physical Health; Resilience; Desire for Improvement   Sleep  Sleep: Sleep: Good Number of Hours of Sleep: 6.75   Physical Exam: Physical Exam Vitals and nursing note reviewed.  Constitutional:      General: Corlis Temples "[name]" is not in acute distress.    Appearance: Normal appearance. Nelly Crudup "[name]" is normal weight. Madisen Thoman "[name]" is not ill-appearing or toxic-appearing.  HENT:     Head: Normocephalic and atraumatic.  Cardiovascular:     Rate and Rhythm: Normal rate.  Pulmonary:     Effort: Pulmonary effort is normal.  Musculoskeletal:        General: Normal range of motion.  Neurological:     Mental Status: Leyah Molino "[name]" is alert.   Review of Systems  Constitutional:  Negative for chills.  Respiratory:  Negative for cough and shortness of breath.   Cardiovascular:  Negative for chest pain.  Gastrointestinal:  Negative for abdominal pain, constipation, diarrhea, nausea and vomiting.  Neurological:  Negative for weakness and headaches.  Psychiatric/Behavioral:  Negative for depression, hallucinations and suicidal ideas. The patient is not nervous/anxious.   Blood pressure 107/72, pulse 89, temperature 97.9 F (36.6 C), temperature source Oral, resp. rate 18, height 5\' 7"  (1.702 m), weight 58.1 kg, SpO2 (!) 68 %. Body mass index is 20.05 kg/m.  Treatment Plan Summary: Daily contact with patient to assess and evaluate symptoms and progress in treatment   Adriana Jackson is a non-binary female (they/them pronouns) who presents under IVC for suicide attempt via Overdose (Atarax and Xanax). PPHx is significant for Depression and multiple suicide attempts.   They are  responding well to the medications without issue. Will continue titration with an increase in Zoloft tonight. Has been integrating into the milieu well and getting along with other patients on the unit. Will continue to monitor.   MDD recurrent Severe with psychotic features (r/o substance induced depressive d/o vs substance induced psychotic d/o, r/o bipolar d/o MRE depressed with psychotic features) R/o PTSD Cluster B traits -Increase Zoloft to 50 mg QHS today -Continue Seroquel 100 mg QHS     Cannabis use d/o R/o alcohol use d/o -Continue CIWA  -Continue Ativan 1 mg q6 PRN CIWA>10 -Continue Thiamine 100 mg daily -Continue multivitamin daily     -Continue PRN's: Tylenol, Maalox, Atarax, Milk of Magnesia, Trazodone  Lauro Franklin, MD 04/01/2021, 10:56 AM

## 2021-04-01 NOTE — Progress Notes (Signed)
Progress note  Pt found in bed; compliant with medication administration. Pt denies any physical complaints or pain. Pt denies their headache today. Pt continues to be guarded and minimal with staff. Pt denies si/hi/ah/vh and verbally agrees to approach staff if these become apparent or before harming themselves/others while at [facility].  A: Pt provided support and encouragement. Pt given medication per protocol and standing orders. Q34m safety checks implemented and continued.  R: Pt safe on the unit. Will continue to monitor.

## 2021-04-01 NOTE — Progress Notes (Signed)
Recreation Therapy Notes  Date: 7.8.22 Time: 0930 Location: [address] Dayroom  Group Topic: Stress Management   Goal Area(s) Addresses:  Patient will actively participate in stress management techniques presented during session.  Patient will successfully identify benefit of practicing stress management post d/c.   Intervention: Guided exercise with ambient sound and script  Activity :Guided Imagery  LRT provided education, instruction, and demonstration on practice of visualization via guided imagery. Patient was asked to participate in the technique introduced during session. LRT also debriefed including topics of mindfulness, stress management and specific scenarios each patient could use these techniques. Patients were given suggestions of ways to access scripts post d/c and encouraged to explore Youtube and other apps available on smartphones, tablets, and computers.   Education:  Stress Management, Discharge Planning.   Education Outcome: Acknowledges education  Clinical Observations/Feedback:  Group did not occur due to goals group going over time.     Adriana Jackson, LRT/CTRS         Adriana Jackson A 04/01/2021 10:44 AM 

## 2021-04-01 NOTE — [facility] Group Notes (Signed)
[facility] Group Notes:  (Nursing/MHT/Case Management/Adjunct)  Date:  04/01/2021  Time:  7:14 PM  Type of Therapy:  Nurse Education  Participation Level:  Active  Participation Quality:  Appropriate and Attentive  Affect:  Blunted  Cognitive:  Alert and Appropriate  Insight:  Improving  Engagement in Group:  Improving  Modes of Intervention:  Discussion  Summary of Progress/Problems:  Discussed stigma in behavioral health.  Discussed ways to advocate for treatment needs   Levin Bacon 04/01/2021, 7:14 PM

## 2021-04-01 NOTE — Progress Notes (Signed)
   03/31/21 2111  Psych Admission Type (Psych Patients Only)  Admission Status Involuntary  Psychosocial Assessment  Patient Complaints Anxiety;Depression  Eye Contact Fair  Facial Expression Pensive;Sullen  Affect Anxious;Depressed  Speech Logical/coherent  Interaction Cautious;Forwards little  Motor Activity Slow  Appearance/Hygiene Unremarkable  Behavior Characteristics Cooperative;Appropriate to situation  Mood Depressed;Pleasant  Thought Process  Content WDL  Delusions None reported or observed  Perception WDL  Hallucination None reported or observed  Judgment Poor  Confusion None  Danger to Self  Current suicidal ideation? Denies  Self-Injurious Behavior No self-injurious ideation or behavior indicators observed or expressed   Agreement Not to Harm Self Yes  Description of Agreement Verbal Contract  Danger to Others  Danger to Others None reported or observed

## 2021-04-01 NOTE — [facility] Group Notes (Signed)
Type of Therapy and Topic:  Group Therapy - Healthy vs Unhealthy Coping Skills  Participation Level:  Active   Description of Group The focus of this group was to determine what unhealthy coping techniques typically are used by group members and what healthy coping techniques would be helpful in coping with various problems. Patients were guided in becoming aware of the differences between healthy and unhealthy coping techniques. Patients were asked to identify 2-3 healthy coping skills they would like to learn to use more effectively.  Therapeutic Goals Patients learned that coping is what human beings do all day long to deal with various situations in their lives Patients defined and discussed healthy vs unhealthy coping techniques Patients identified their preferred coping techniques and identified whether these were healthy or unhealthy Patients determined 2-3 healthy coping skills they would like to become more familiar with and use more often. Patients provided support and ideas to each other   Summary of Patient Progress:  Due to the acuity and complex discharge plans, group was not held. Patient was provided therapeutic worksheets and asked to meet with CSW as needed. 

## 2021-04-02 MED ORDER — QUETIAPINE FUMARATE 50 MG PO TABS
150.0000 mg | ORAL_TABLET | Freq: Every day | ORAL | Status: DC
  Administered 2021-04-02 – 2021-04-03 (×2): 150 mg via ORAL
  Filled 2021-04-02 (×4): qty 3

## 2021-04-02 NOTE — [facility] Group Notes (Signed)
[facility] Group Notes:  (Nursing/MHT/Case Management/Adjunct)  Date:  04/02/2021  Time:  11:06 AM  Type of Therapy:   Orientation and goals group  Participation Level:  Active  Participation Quality:  Appropriate and Attentive  Affect:  Appropriate  Cognitive:  Alert and Appropriate  Insight:  Appropriate and Improving  Engagement in Group:  Engaged and Supportive  Modes of Intervention:  Discussion and Education  Summary of Progress/Problems:  She reported her goal for today is "go to more groups."  She reported not sleeping well due to waking up frequently.  Norm Parcel Yogesh Cominsky 04/02/2021, 11:06 AM

## 2021-04-02 NOTE — [facility] Group Notes (Signed)
[facility] Group Notes:  (Nursing/MHT/Case Management/Adjunct)  Date:  04/02/2021  Time:  3:58 PM  Type of Therapy:  Nurse Education  Participation Level:  Active  Participation Quality:  Appropriate and Attentive  Affect:  Depressed  Cognitive:  Alert and Appropriate  Insight:  Improving  Engagement in Group:  Engaged  Modes of Intervention:  Activity  Summary of Progress/Problems:  Activity discussing listening and getting to know "you" activity with peers.  She had good participation and was supportive towards her peers.  Nanna Ertle V Tyree Fluharty 04/02/2021, 3:58 PM  

## 2021-04-02 NOTE — Progress Notes (Addendum)
   04/02/21 1100  Psych Admission Type (Psych Patients Only)  Admission Status Involuntary  Psychosocial Assessment  Patient Complaints Anxiety;Depression  Eye Contact Fair  Facial Expression Flat  Affect Anxious;Appropriate to circumstance  Speech Logical/coherent  Interaction Cautious;Forwards little;Guarded;Minimal  Motor Activity Slow  Appearance/Hygiene Unremarkable  Behavior Characteristics Cooperative;Anxious  Mood Anxious;Pleasant  Thought Process  Coherency Concrete thinking  Content WDL  Delusions None reported or observed  Perception WDL  Hallucination None reported or observed  Judgment Poor  Confusion None  Danger to Self  Current suicidal ideation? Denies  Self-Injurious Behavior No self-injurious ideation or behavior indicators observed or expressed   Agreement Not to Harm Self Yes  Description of Agreement Verbal Contract  Danger to Others  Danger to Others None reported or observed     D. Pt presents with a flat affect, anxious mood- rated their depression, hopelessness and anxiety a 03/01/09, respectively. Pt has been visible on the unit interacting appropriately with peers, and observed attending groups.  Pt currently denies SI/HI and AVH A. Labs and vitals monitored. Pt supported emotionally and encouraged to express concerns and ask questions.   R. Pt remains safe with 15 minute checks. Will continue POC.

## 2021-04-02 NOTE — [facility] Group Notes (Signed)
[facility] Group Notes:  (Nursing/MHT/Case Management/Adjunct)  Date:  04/02/2021  Time:  7:20 PM  Type of Therapy:  Nurse Education  Participation Level:  Active  Participation Quality:  Attentive  Affect:  Depressed  Cognitive:  Alert and Appropriate  Insight:  Improving  Engagement in Group:  Developing/Improving  Modes of Intervention:  Education  Summary of Progress/Problems:  Discussed sleep hygiene and handout given.   Avi Kerschner V Kerrianne Jeng 04/02/2021, 7:20 PM  

## 2021-04-02 NOTE — [facility] Group Notes (Signed)
LCSW Group Therapy Note 04/02/2021  11:15am-12:00pm  Type of Therapy and Topic:  Group Therapy: Anger and Commonalities  Participation Level:  Active   Description of Group: In this group, patients initially shared an "unknown" fact about themselves and CSW led a discussion about the ways in which we have things in common without realizing it.  Patient then identified a recent time they became angry and how this yet again showed a way in which they had something in common with other patients  We discussed possible unhealthy reactions to anger and possible healthy reactions.  We also discussed possible underlying emotions that lead to the anger.  Commonalities among group members were pointed out throughout the entirety of group.  Therapeutic Goals: Patients were asked to share something about themselves and learned that they often have things in common with other people without knowing this Patients will remember their last incident of anger and how they reacted Patients will be able to identify their reaction as healthy or unhealthy, and identify possible reactions that would have been the opposite Patients will learn that anger itself is a secondary emotion and will think about their primary emotion at the time of their last incident of anger  Summary of Patient Progress:  The patient shared that something interesting they could share about themself is that their natural birth color of hair was red.  A desire for red hair was also present in several other patients, so the patient was able to recognize that they are not alone.  The patient also said a frequent cause of anger is when people take advantage of their kindness.  Their original form of dealing with this was to not want to talk to those eople any longer, while it also has given them "food for thoughts."    Their conclusion by the end of group was that this chosen method of coping is healthy.  Therapeutic Modalities:   Cognitive  Behavioral Therapy  Lynnell Chad, LCSW 04/02/2021  1:52 PM

## 2021-04-02 NOTE — Plan of Care (Signed)
  Problem: Activity: Goal: Interest or engagement in leisure activities will improve Outcome: Progressing   Problem: Self-Concept: Goal: Level of anxiety will decrease Outcome: Not Progressing   Problem: Coping: Goal: Coping ability will improve Outcome: Not Progressing

## 2021-04-02 NOTE — Progress Notes (Addendum)
[facility]  04/01/2021 10:56 AM Adriana HaagensenZoe Jackson  MRN:  [id number] Subjective:   Adriana "Adriana" Raiford Simmondseralta is a non-binary female (they/them pronouns) who presents under IVC for suicide attempt via Overdose (Atarax and Xanax). PPHx is significant for Depression and multiple suicide attempts.   They report that they tolerated the increase in the Zoloft last night without any issues. They report that they slept well last night. They report that their appetite is good. They report no SI, HI, or AVH. They report that they are interacting well with others on the unit and have been in the group room playing games with other patients. They report they are still having some anxiety so discussed increasing Seroquel and they were in agreement. They have no other concerns at present.   Principal Problem: Severe episode of recurrent major depressive disorder, with psychotic features (HCC) Diagnosis: Principal Problem:   Severe episode of recurrent major depressive disorder, with psychotic features (HCC)  Total Time spent with patient: 30 minutes  Past Psychiatric History: Depression, multiple suicide attempts.  Past Medical History:  Past Medical History:  Diagnosis Date   Asthma    History reviewed. No pertinent surgical history. Family History: History reviewed. No pertinent family history. Family Psychiatric  History: None Reported Social History:  Social History   Substance and Sexual Activity  Alcohol Use Yes   Alcohol/week: 4.0 standard drinks   Types: 4 Shots of liquor per week   Comment: 4 shots/day     Social History   Substance and Sexual Activity  Drug Use Yes   Types: Marijuana   Comment: delta 8    Social History   Socioeconomic History   Marital status: Single    Spouse name: Not on file   Number of children: Not on file   Years of education: Not on file   Highest education level: Not on file  Occupational History   Not on file  Tobacco Use   Smoking status: Never    Smokeless tobacco: Never  Vaping Use   Vaping Use: Never used  Substance and Sexual Activity   Alcohol use: Yes    Alcohol/week: 4.0 standard drinks    Types: 4 Shots of liquor per week    Comment: 4 shots/day   Drug use: Yes    Types: Marijuana    Comment: delta 8   Sexual activity: Yes  Other Topics Concern   Not on file  Social History Narrative   Not on file   Social Determinants of Health   Financial Resource Strain: Not on file  Food Insecurity: Not on file  Transportation Needs: Not on file  Physical Activity: Not on file  Stress: Not on file  Social Connections: Not on file   Additional Social History:                         Sleep: Good  Appetite:  Good  Current Medications: Current Facility-Administered Medications  Medication Dose Route Frequency Provider Last Rate Last Admin   acetaminophen (TYLENOL) tablet 650 mg  650 mg Oral Q6H PRN Mason JimSingleton, Horrace Hanak E, MD   650 mg at 03/31/21 1604   hydrOXYzine (ATARAX/VISTARIL) tablet 25 mg  25 mg Oral Q6H PRN Bartholomew CrewsSingleton, Oaklee Sunga E, MD       loperamide (IMODIUM) capsule 2-4 mg  2-4 mg Oral PRN Mason JimSingleton, Kyliee Ortego E, MD       LORazepam (ATIVAN) tablet 1 mg  1 mg Oral Q6H PRN Mason JimSingleton,  Avian Greenawalt E, MD       magnesium hydroxide (MILK OF MAGNESIA) suspension 5 mL  5 mL Oral Daily PRN Comer Locket, MD       multivitamin with minerals tablet 1 tablet  1 tablet Oral Daily Mason Jim, Cordelro Gautreau E, MD   1 tablet at 04/01/21 0736   ondansetron (ZOFRAN-ODT) disintegrating tablet 4 mg  4 mg Oral Q6H PRN Comer Locket, MD       QUEtiapine (SEROQUEL) tablet 100 mg  100 mg Oral QHS Lauro Franklin, MD   100 mg at 03/31/21 2111   sertraline (ZOLOFT) tablet 25 mg  25 mg Oral QHS Mason Jim, Zaelyn Noack E, MD   25 mg at 03/31/21 2111   thiamine tablet 100 mg  100 mg Oral Daily Comer Locket, MD   100 mg at 04/01/21 0736   traZODone (DESYREL) tablet 50 mg  50 mg Oral QHS PRN Comer Locket, MD   50 mg at 03/30/21 2148    Lab Results:   Results for orders placed or performed during the hospital encounter of 03/30/21 (from the past 48 hour(s))  Hemoglobin A1c     Status: None   Collection Time: 03/31/21  6:35 AM  Result Value Ref Range   Hgb A1c MFr Bld 5.1 4.8 - 5.6 %    Comment: (NOTE) Pre diabetes:          5.7%-6.4%  Diabetes:              >6.4%  Glycemic control for   <7.0% adults with diabetes    Mean Plasma Glucose 99.67 mg/dL    Comment: Performed at [facility], [address], [place], [place] [address]  TSH     Status: None   Collection Time: 03/31/21  6:35 AM  Result Value Ref Range   TSH 2.618 0.350 - 4.500 uIU/mL    Comment: Performed by a 3rd Generation assay with a functional sensitivity of <=0.01 uIU/mL. Performed at [facility], [address] [place], [place] [address]   Lipid panel     Status: None   Collection Time: 03/31/21  6:35 AM  Result Value Ref Range   Cholesterol 163 0 - 200 mg/dL   Triglycerides 71 <979 mg/dL   HDL 55 >89 mg/dL   Total CHOL/HDL Ratio 3.0 RATIO   VLDL 14 0 - 40 mg/dL   LDL Cholesterol 94 0 - 99 mg/dL    Comment:        Total Cholesterol/HDL:CHD Risk Coronary Heart Disease Risk Table                     Men   Women  1/2 Average Risk   3.4   3.3  Average Risk       5.0   4.4  2 X Average Risk   9.6   7.1  3 X Average Risk  23.4   11.0        Use the calculated Patient Ratio above and the CHD Risk Table to determine the patient's CHD Risk.        ATP III CLASSIFICATION (LDL):  <100     mg/dL   Optimal  211-941  mg/dL   Near or Above                    Optimal  130-159  mg/dL   Borderline  740-814  mg/dL   High  >481  mg/dL   Very High Performed at [facility], [address] [place], [place] [address]     Blood Alcohol level:  Lab Results  Component Value Date   ETH <10 03/29/2021    Metabolic Disorder Labs: Lab Results  Component Value Date   HGBA1C 5.1 03/31/2021   MPG 99.67 03/31/2021    No results found for: PROLACTIN Lab Results  Component Value Date   CHOL 163 03/31/2021   TRIG 71 03/31/2021   HDL 55 03/31/2021   CHOLHDL 3.0 03/31/2021   VLDL 14 03/31/2021   LDLCALC 94 03/31/2021   LDLCALC 92 06/21/2018    Physical Findings: AIMS: Facial and Oral Movements Muscles of Facial Expression: None, normal Lips and Perioral Area: None, normal Jaw: None, normal Tongue: None, normal,Extremity Movements Upper (arms, wrists, hands, fingers): None, normal Lower (legs, knees, ankles, toes): None, normal, Trunk Movements Neck, shoulders, hips: None, normal, Overall Severity Severity of abnormal movements (highest score from questions above): None, normal Incapacitation due to abnormal movements: None, normal Patient's awareness of abnormal movements (rate only patient's report): No Awareness, Dental Status Current problems with teeth and/or dentures?: No Does patient usually wear dentures?: No  CIWA:  CIWA-Ar Total: 4 COWS:     Musculoskeletal: Strength & Muscle Tone: within normal limits Gait & Station: normal Patient leans: N/A  Psychiatric Specialty Exam:  Presentation  General Appearance: Appropriate for Environment; Fairly Groomed  Eye Contact:Good  Speech:Clear and Coherent; Normal Rate  Speech Volume:Normal  Handedness: No data recorded  Mood and Affect  Mood:Described as improving but appears mildly anxious  Affect:appears mildly constricted   Thought Process  Thought Processes:Coherent  Descriptions of Associations:Intact  Orientation:Full (Time, Place and Person)  Thought Content:residual paranoia but denies AVH, ideas of reference, or first rank symptoms  History of Schizophrenia/Schizoaffective disorder:No  Duration of Psychotic Symptoms:Less than six months  Hallucinations:Hallucinations: None Ideas of Reference:None  Suicidal Thoughts:Suicidal Thoughts: No Homicidal Thoughts:Homicidal Thoughts: No  Sensorium   Memory:Immediate Good; Recent Good  Judgment:Fair  Insight:Fair   Executive Functions  Concentration:Good  Attention Span:Good  Recall:Good  Fund of Knowledge:Good  Language:Good   Psychomotor Activity  Psychomotor Activity: Psychomotor Activity: Normal  Assets  Assets:Communication Skills; Physical Health; Resilience; Desire for Improvement   Sleep  Sleep: Sleep: Good Number of Hours of Sleep: 6.75   Physical Exam: Physical Exam Vitals and nursing note reviewed.  Constitutional:      General: Adriana Evilsizer "Adriana" is not in acute distress.    Appearance: Normal appearance. Adriana Girten "Adriana" is normal weight. Adriana Kishi "Adriana" is not ill-appearing or toxic-appearing.  HENT:     Head: Normocephalic and atraumatic.  Cardiovascular:     Rate and Rhythm: Normal rate.  Pulmonary:     Effort: Pulmonary effort is normal.  Musculoskeletal:        General: Normal range of motion.  Neurological:     Mental Status: Adriana Fleece "Adriana" is alert.   Review of Systems  Constitutional:  Negative for chills.  Respiratory:  Negative for cough and shortness of breath.   Cardiovascular:  Negative for chest pain.  Gastrointestinal:  Negative for abdominal pain, constipation, diarrhea, nausea and vomiting.  Neurological:  Negative for weakness and headaches.  Psychiatric/Behavioral:  Negative for depression, hallucinations and suicidal ideas. The patient is not nervous/anxious.   Blood pressure 107/72, pulse 89, temperature 97.9 F (36.6 C), temperature source Oral, resp. rate 18, height 5\' 7"  (1.702 m), weight 58.1 kg, SpO2 (!) 68 %. Body  mass index is 20.05 kg/m.   Treatment Plan Summary: Daily contact with patient to assess and evaluate symptoms and progress in treatment   Adriana Jackson is a non-binary female (they/them pronouns) who presents under IVC for suicide attempt via Overdose (Atarax and Xanax). PPHx is significant for Depression and multiple suicide  attempts.   They are tolerating the medication titration well. Since they are still having significant anxiety will further increase Seroquel. Will plan for discharge Monday.    MDD recurrent Severe with psychotic features (r/o substance induced depressive d/o vs substance induced psychotic d/o, r/o bipolar d/o MRE depressed with psychotic features) R/o PTSD Cluster B traits -Continue Zoloft 50 mg QHS -Increase Seroquel to 150 mg QHS tonight for residual paranoia and anxiety    Cannabis use d/o R/o alcohol use d/o -Continue CIWA  -Continue Ativan 1 mg q6 PRN CIWA>10 -Continue Thiamine 100 mg daily -Continue multivitamin daily     -Continue PRN's: Tylenol, Maalox, Atarax, Milk of Magnesia, Trazodone  Lauro Franklin, MD 04/02/2021, 7:16 AM

## 2021-04-02 NOTE — Progress Notes (Signed)
   04/01/21 2108  Psych Admission Type (Psych Patients Only)  Admission Status Involuntary  Psychosocial Assessment  Patient Complaints Anxiety;Depression  Eye Contact Fair  Facial Expression Anxious;Pensive  Affect Anxious;Appropriate to circumstance  Speech Logical/coherent  Interaction Cautious;Forwards little;Guarded;Minimal  Motor Activity Slow  Appearance/Hygiene Unremarkable  Behavior Characteristics Cooperative;Appropriate to situation;Anxious  Mood Anxious;Pleasant  Thought Process  Coherency Concrete thinking  Content WDL  Delusions None reported or observed  Perception WDL  Hallucination None reported or observed  Judgment Poor  Confusion None  Danger to Self  Current suicidal ideation? Denies  Self-Injurious Behavior No self-injurious ideation or behavior indicators observed or expressed   Danger to Others  Danger to Others None reported or observed

## 2021-04-03 MED ORDER — HYDROXYZINE HCL 25 MG PO TABS
25.0000 mg | ORAL_TABLET | Freq: Three times a day (TID) | ORAL | Status: DC | PRN
  Administered 2021-04-03: 25 mg via ORAL
  Filled 2021-04-03: qty 1

## 2021-04-03 NOTE — Progress Notes (Signed)
   04/03/21 0815  Psych Admission Type (Psych Patients Only)  Admission Status Involuntary  Psychosocial Assessment  Patient Complaints Anxiety  Eye Contact Fair  Facial Expression Flat  Affect Anxious;Appropriate to circumstance  Speech Logical/coherent  Interaction Cautious;Forwards little;Minimal  Motor Activity Slow  Appearance/Hygiene Unremarkable  Behavior Characteristics Cooperative;Anxious  Mood Pleasant;Anxious  Thought Process  Coherency Concrete thinking  Content WDL  Delusions None reported or observed  Perception WDL  Hallucination None reported or observed  Judgment Poor  Confusion None  Danger to Self  Current suicidal ideation? Denies  Self-Injurious Behavior No self-injurious ideation or behavior indicators observed or expressed   Agreement Not to Harm Self Yes  Description of Agreement Verbal Contract  Danger to Others  Danger to Others None reported or observed    [place] NOVEL CORONAVIRUS (COVID-19) DAILY CHECK-OFF SYMPTOMS - answer yes or no to each - every day NO YES  Have you had a fever in the past 24 hours?  Fever (Temp > 37.80C / 100F) X    Have you had any of these symptoms in the past 24 hours? New Cough  Sore Throat   Shortness of Breath  Difficulty Breathing  Unexplained Body Aches   X    Have you had any one of these symptoms in the past 24 hours not related to allergies?   Runny Nose  Nasal Congestion  Sneezing   X    If you have had runny nose, nasal congestion, sneezing in the past 24 hours, has it worsened?   X    EXPOSURES - check yes or no X    Have you traveled outside the state in the past 14 days?   X    Have you been in contact with someone with a confirmed diagnosis of COVID-19 or PUI in the past 14 days without wearing appropriate PPE?   X    Have you been living in the same home as a person with confirmed diagnosis of COVID-19 or a PUI (household contact)?     X    Have you been diagnosed with COVID-19?     X                                                                                                                              What to do next: Answered NO to all: Answered YES to anything:    Proceed with unit schedule Follow the [facility] Inpatient Flowsheet.

## 2021-04-03 NOTE — [facility] Group Notes (Signed)
LCSW Group Therapy Note   Type of Therapy and Topic:  Group Therapy - Healthy vs Unhealthy Coping Skills  Participation Level:  Active  Description of Group The focus of this group was to determine what unhealthy coping techniques typically are used by group members and what healthy coping techniques would be helpful in coping with various problems. Patients were guided in becoming aware of the differences between healthy and unhealthy coping techniques. Patients were asked to identify 2-3 healthy coping skills they would like to learn to use more effectively.  Therapeutic Goals 1. Patients learned that coping is what human beings do all day long to deal with various situations in their lives 2. Patients defined and discussed healthy vs unhealthy coping techniques 3. Patients identified their preferred coping techniques and identified whether these were healthy or unhealthy 4. Patients determined 2-3 healthy coping skills they would like to become more familiar with and use more often. 5. Patients provided support and ideas to each other   Summary of Patient Progress:   Pt was appropriate, active, and attentive during group discussion.   Therapeutic Modalities Cognitive Behavioral Therapy Motivational Interviewing  Tzippy Testerman M Mats Jeanlouis, LCSWA 04/03/2021  11:02 AM    

## 2021-04-03 NOTE — Progress Notes (Signed)
Adult Psychoeducational Group Note  Date:  04/03/2021 Time:  9:18 AM  Group Topic/Focus:  Goals Group:   The focus of this group is to help patients establish daily goals to achieve during treatment and discuss how the patient can incorporate goal setting into their daily lives to aide in recovery.  Participation Level:  Active  Participation Quality:  Appropriate  Affect:  Appropriate  Cognitive:  Appropriate  Insight: Appropriate  Engagement in Group:  Engaged  Modes of Intervention:  Discussion  Additional Comments:  Pt attended group and participated in discussion. Pt states that she is feeling much better she has talked to her family and they are being supported. She states that she feels the medication is working.  Burrel Legrand R Prince Couey 04/03/2021, 9:18 AM

## 2021-04-03 NOTE — Plan of Care (Signed)
Pleasant and cooperative.  Active in the milieu and pleasant on approach. Denied SI/HI. Reportedhaving aftercare plan and stated "I am gonna get more involved with my family". Reported not having issues with school "I want to be a therapist". Was encouraged to express thoughts and feelings as needed. Safety precautions maintained.

## 2021-04-03 NOTE — Plan of Care (Signed)
  Problem: Education: Goal: Knowledge of the prescribed therapeutic regimen will improve Outcome: Progressing   Problem: Coping: Goal: Coping ability will improve Outcome: Progressing   Problem: Self-Concept: Goal: Level of anxiety will decrease Outcome: Not Progressing

## 2021-04-03 NOTE — Progress Notes (Addendum)
[facility] MD Progress Note  04/03/2021 4:31 PM Adriana Jackson  MRN:  [id number] Subjective:   Adriana Jackson is a non-binary female (they/them pronouns) who presents under IVC for suicide attempt via Overdose (Atarax and Xanax). PPHx is significant for Depression and multiple suicide attempts.   They report that they are doing great today. They report that their anxiety is significantly reduced with the increase in Seroquel made last night. They report that they have no additional drowsiness and that the drowsiness they were experiencing has resolved. They rate their anxiety as a 3 out of 10. They report that their sleep was good. They report that their appetite is good. They report no SI, HI, or AVH. They did ask about intrusive thoughts they would occasionally get before being admitted. Discussed that these should become less frequent and less intrusive as her medications build up. They were reassured by this. They had no other concerns.   Principal Problem: Severe episode of recurrent major depressive disorder, with psychotic features (HCC) Diagnosis: Principal Problem:   Severe episode of recurrent major depressive disorder, with psychotic features (HCC)  Total Time spent with patient: 30 minutes  Past Psychiatric History: Depression, multiple suicide attempts.  Past Medical History:  Past Medical History:  Diagnosis Date   Asthma    History reviewed. No pertinent surgical history. Family History: History reviewed. No pertinent family history. Family Psychiatric  History: None Reported Social History:  Social History   Substance and Sexual Activity  Alcohol Use Yes   Alcohol/week: 4.0 standard drinks   Types: 4 Shots of liquor per week   Comment: 4 shots/day     Social History   Substance and Sexual Activity  Drug Use Yes   Types: Marijuana   Comment: delta 8    Social History   Socioeconomic History   Marital status: Single    Spouse name: Not on file   Number of  children: Not on file   Years of education: Not on file   Highest education level: Not on file  Occupational History   Not on file  Tobacco Use   Smoking status: Never   Smokeless tobacco: Never  Vaping Use   Vaping Use: Never used  Substance and Sexual Activity   Alcohol use: Yes    Alcohol/week: 4.0 standard drinks    Types: 4 Shots of liquor per week    Comment: 4 shots/day   Drug use: Yes    Types: Marijuana    Comment: delta 8   Sexual activity: Yes  Other Topics Concern   Not on file  Social History Narrative   Not on file   Social Determinants of Health   Financial Resource Strain: Not on file  Food Insecurity: Not on file  Transportation Needs: Not on file  Physical Activity: Not on file  Stress: Not on file  Social Connections: Not on file   Additional Social History:                         Sleep: Good  Appetite:  Good  Current Medications: Current Facility-Administered Medications  Medication Dose Route Frequency Provider Last Rate Last Admin   acetaminophen (TYLENOL) tablet 650 mg  650 mg Oral Q6H PRN Mason Jim, Saralee Bolick E, MD   650 mg at 03/31/21 1604   albuterol (VENTOLIN HFA) 108 (90 Base) MCG/ACT inhaler 2 puff  2 puff Inhalation Q6H PRN Jaclyn Shaggy, PA-C       hydrOXYzine (ATARAX/VISTARIL)  tablet 25 mg  25 mg Oral TID PRN Lauro Franklin, MD       magnesium hydroxide (MILK OF MAGNESIA) suspension 5 mL  5 mL Oral Daily PRN Comer Locket, MD       multivitamin with minerals tablet 1 tablet  1 tablet Oral Daily Bartholomew Crews E, MD   1 tablet at 04/03/21 0809   QUEtiapine (SEROQUEL) tablet 150 mg  150 mg Oral QHS Lauro Franklin, MD   150 mg at 04/02/21 2115   sertraline (ZOLOFT) tablet 50 mg  50 mg Oral QHS Lauro Franklin, MD   50 mg at 04/02/21 2115   thiamine tablet 100 mg  100 mg Oral Daily Mason Jim, Trinity Haun E, MD   100 mg at 04/03/21 0809   traZODone (DESYREL) tablet 50 mg  50 mg Oral QHS PRN Comer Locket, MD   50 mg  at 03/30/21 2148    Lab Results: No results found for this or any previous visit (from the past 48 hour(s)).  Blood Alcohol level:  Lab Results  Component Value Date   ETH <10 03/29/2021    Metabolic Disorder Labs: Lab Results  Component Value Date   HGBA1C 5.1 03/31/2021   MPG 99.67 03/31/2021   No results found for: PROLACTIN Lab Results  Component Value Date   CHOL 163 03/31/2021   TRIG 71 03/31/2021   HDL 55 03/31/2021   CHOLHDL 3.0 03/31/2021   VLDL 14 03/31/2021   LDLCALC 94 03/31/2021   LDLCALC 92 06/21/2018    Physical Findings: AIMS: Facial and Oral Movements Muscles of Facial Expression: None, normal Lips and Perioral Area: None, normal Jaw: None, normal Tongue: None, normal,Extremity Movements Upper (arms, wrists, hands, fingers): None, normal Lower (legs, knees, ankles, toes): None, normal, Trunk Movements Neck, shoulders, hips: None, normal, Overall Severity Severity of abnormal movements (highest score from questions above): None, normal Incapacitation due to abnormal movements: None, normal Patient's awareness of abnormal movements (rate only patient's report): No Awareness, Dental Status Current problems with teeth and/or dentures?: No Does patient usually wear dentures?: No  CIWA:  CIWA-Ar Total: 3 COWS:     Musculoskeletal: Strength & Muscle Tone: within normal limits Gait & Station: normal Patient leans: N/A  Psychiatric Specialty Exam:  Presentation  General Appearance: Appropriate for Environment  Eye Contact:Good  Speech:Clear and Coherent; Normal Rate  Speech Volume:Normal  Handedness: No data recorded  Mood and Affect  Mood:Euthymic  Affect:Appropriate   Thought Process  Thought Processes:Coherent; Goal Directed  Descriptions of Associations:Intact  Orientation:Full (Time, Place and Person)  Thought Content:WDL  History of Schizophrenia/Schizoaffective disorder:No  Duration of Psychotic Symptoms:Less than six  months  Hallucinations:Hallucinations: None  Ideas of Reference:None  Suicidal Thoughts:Suicidal Thoughts: No  Homicidal Thoughts:Homicidal Thoughts: No   Sensorium  Memory:Immediate Good; Recent Good  Judgment:Fair  Insight:Fair   Executive Functions  Concentration:Good  Attention Span:Good  Recall:Good  Fund of Knowledge:Good  Language:Good   Psychomotor Activity  Psychomotor Activity:Psychomotor Activity: Normal   Assets  Assets:Communication Skills; Desire for Improvement; Physical Health; Social Support; Resilience   Sleep  Sleep:Sleep: Good Number of Hours of Sleep: 6.75    Physical Exam: Physical Exam Vitals and nursing note reviewed.  Constitutional:      General: Adriana Haberman "[name]" is not in acute distress.    Appearance: Normal appearance. Adriana Guinther "[name]" is normal weight. Adriana Sulser "[name]" is not ill-appearing or toxic-appearing.  HENT:     Head: Normocephalic and atraumatic.  Cardiovascular:  Rate and Rhythm: Normal rate.  Pulmonary:     Effort: Pulmonary effort is normal.  Musculoskeletal:        General: Normal range of motion.  Neurological:     Mental Status: Adriana Lumpkin "[name]" is alert.   Review of Systems  Constitutional:  Negative for chills.  Respiratory:  Negative for cough and shortness of breath.   Cardiovascular:  Negative for chest pain.  Gastrointestinal:  Negative for abdominal pain, constipation, diarrhea, nausea and vomiting.  Neurological:  Negative for weakness and headaches.  Psychiatric/Behavioral:  Negative for depression and suicidal ideas. The patient is not nervous/anxious.   Blood pressure (!) 124/91, pulse 82, temperature 98.3 F (36.8 C), temperature source Oral, resp. rate 16, height 5\' 7"  (1.702 m), weight 58.1 kg, SpO2 100 %. Body mass index is 20.05 kg/m.   Treatment Plan Summary: Daily contact with patient to assess and evaluate symptoms and progress in treatment    Adriana "[name]"  Jackson is a non-binary female (they/them pronouns) who presents under IVC for suicide attempt via Overdose (Atarax and Xanax). PPHx is significant for Depression and multiple suicide attempts.     They are doing good. They have tolerated starting and titrating the medications without side effect. Will not make further changes to medications today. Will plan for discharge tomorrow.     MDD recurrent Severe with psychotic features (r/o substance induced depressive d/o vs substance induced psychotic d/o, r/o bipolar d/o MRE depressed with psychotic features) R/o PTSD Cluster B traits -Continue Zoloft 50 mg QHS -Continue Seroquel 150 mg QHS for paranoia and anxiety     Cannabis use d/o R/o alcohol use d/o -Continue CIWA  -Continue Ativan 1 mg q6 PRN CIWA>10 -Continue Thiamine 100 mg daily -Continue multivitamin daily      -Continue PRN's: Tylenol, Maalox, Atarax, Milk of Magnesia, Trazodone   Raiford Simmonds, MD 04/03/2021, 4:31 PM

## 2021-04-03 NOTE — Progress Notes (Signed)
     04/02/21 2115  Psych Admission Type (Psych Patients Only)  Admission Status Involuntary  Psychosocial Assessment  Patient Complaints Anxiety;Depression  Eye Contact Fair  Facial Expression Flat  Affect Anxious;Appropriate to circumstance  Speech Logical/coherent  Interaction Cautious;Forwards little;Guarded;Minimal  Motor Activity Slow  Appearance/Hygiene Unremarkable  Behavior Characteristics Cooperative;Anxious  Mood Anxious;Pleasant  Thought Process  Coherency Concrete thinking  Content WDL  Delusions None reported or observed  Perception WDL  Hallucination None reported or observed  Judgment Poor  Confusion None  Danger to Self  Current suicidal ideation? Denies  Self-Injurious Behavior No self-injurious ideation or behavior indicators observed or expressed   Agreement Not to Harm Self Yes  Description of Agreement Verbal Contract  Danger to Others  Danger to Others None reported or observed

## 2021-04-04 MED ORDER — BENZTROPINE MESYLATE 0.5 MG PO TABS
0.5000 mg | ORAL_TABLET | Freq: Two times a day (BID) | ORAL | Status: AC
  Administered 2021-04-04 (×2): 0.5 mg via ORAL
  Filled 2021-04-04 (×3): qty 1

## 2021-04-04 NOTE — Progress Notes (Signed)
[facility] Medical Student Progress Note    Patient Name: Adriana Jackson  MRN: [id number]  DOB: [birth year]   Subjective:  Patient states they are doing well today. They deny SI, HI, and AVH. Their appetite and sleep have improved over the course of their admission. They were looking forward to going home today, and they said their family was looking forward to it too; however, they understand that they will need to stay one more day for medication changes due to new-onset tongue fasciculations as well as left hand and left foot tremors. Otherwise, they deny any headache, pain, or other medication side effects.  Past Psychiatric History: Chronic depression and multiple suicide attempts.  Past Medical History: Past Medical History:  Diagnosis Date   Asthma     Family History: History reviewed. No pertinent family history.   Current Medications:  Current Facility-Administered Medications:    acetaminophen (TYLENOL) tablet 650 mg, 650 mg, Oral, Q6H PRN, Mason Jim, Amy E, MD, 650 mg at 03/31/21 1604   albuterol (VENTOLIN HFA) 108 (90 Base) MCG/ACT inhaler 2 puff, 2 puff, Inhalation, Q6H PRN, Ladona Ridgel, Cody W, PA-C   benztropine (COGENTIN) tablet 0.5 mg, 0.5 mg, Oral, BID, Pashayan, Mardelle Matte, MD, 0.5 mg at 04/04/21 4696   hydrOXYzine (ATARAX/VISTARIL) tablet 25 mg, 25 mg, Oral, TID PRN, Lauro Franklin, MD, 25 mg at 04/03/21 1640   magnesium hydroxide (MILK OF MAGNESIA) suspension 5 mL, 5 mL, Oral, Daily PRN, Mason Jim, Amy E, MD   multivitamin with minerals tablet 1 tablet, 1 tablet, Oral, Daily, Mason Jim, Amy E, MD, 1 tablet at 04/04/21 0735   sertraline (ZOLOFT) tablet 50 mg, 50 mg, Oral, QHS, Pashayan, Mardelle Matte, MD, 50 mg at 04/03/21 2057   thiamine tablet 100 mg, 100 mg, Oral, Daily, Singleton, Amy E, MD, 100 mg at 04/04/21 0735   traZODone (DESYREL) tablet 50 mg, 50 mg, Oral, QHS PRN, Comer Locket, MD, 50 mg at 03/30/21 2148  Social History: Social History   Socioeconomic  History   Marital status: Single    Spouse name: Not on file   Number of children: Not on file   Years of education: Not on file   Highest education level: Not on file  Occupational History   Not on file  Tobacco Use   Smoking status: Never   Smokeless tobacco: Never  Vaping Use   Vaping Use: Never used  Substance and Sexual Activity   Alcohol use: Yes    Alcohol/week: 4.0 standard drinks    Types: 4 Shots of liquor per week    Comment: 4 shots/day   Drug use: Yes    Types: Marijuana    Comment: delta 8   Sexual activity: Yes  Other Topics Concern   Not on file  Social History Narrative   Not on file   Social Determinants of Health   Financial Resource Strain: Not on file  Food Insecurity: Not on file  Transportation Needs: Not on file  Physical Activity: Not on file  Stress: Not on file  Social Connections: Not on file    Review of Systems: CON: No fever or chills CAR: No palpitations, chest discomfort RES: No shortness of breath GI: No nausea, vomiting, diarrhea, constipation GU: No urinary discomfort PSY: Positive for anxiety  Objective: Psychiatric Specialty Exam: Blood pressure 117/85, pulse (!) 109, temperature 98 F (36.7 C), temperature source Oral, resp. rate 18, height 5\' 7"  (1.702 m), weight 58.1 kg, SpO2 100 %.Body mass index is 20.05 kg/m.  General  Appearance: Casual and Fairly Groomed  Eye Contact:  Good  Speech:  Clear and Coherent and Normal Rate  Volume:  Decreased  Mood:  Anxious  Affect:  Constricted  Thought Process:  Coherent and Goal Directed  Orientation:  Full (Time, Place, and Person)  Thought Content:  WDL  Suicidal Thoughts:  No  Homicidal Thoughts:  No  Memory:  Recent;   Good  Judgement:  Fair  Insight:  Good  Psychomotor Activity:   TD-like fasciculations of tongue, left hand, left foot  Concentration:  Concentration: Good  Recall:  Fair  Fund of Knowledge:  Fair  Language:  Good  Akathisia:  No  AIMS (if indicated):     See below  Assets:  Communication Skills Desire for Improvement Housing Social Support Vocational/Educational  ADL's:  Intact  Cognition:  WNL  Sleep:  Number of Hours: 6   Physical Exam: GEN: Well developed, in NAD, conversant HEAD: NCAT, neck supple  EENT: Mild fasciculations of tongue on extension RESP: Breathing comfortably on RA EXT: Moves all extremities equally, mild tremor of left foot and left hand on extension  AIMS: -Facial and Oral Movements Muscles of Facial Expression: None, normal Lips and Perioral Area: None, normal Jaw: None, normal Tongue: Mild fasciculations on extension, not incapacitating -Extremity Movements Upper (arms, wrists, hands, fingers): Mild tremor of left hand on extension, not incapacitating Lower (legs, knees, ankles, toes): Mild tremor of left foot on extension, not incapacitating -Trunk Movements Neck, shoulders, hips: None, normal -Overall Severity Severity of abnormal movements (highest score from questions above): Mild severity Incapacitation due to abnormal movements: No incapacitation Patient's awareness of abnormal movements (rate only patient's report): No awareness -Dental Status Current problems with teeth and/or dentures?: No Does patient usually wear dentures?: No  CIWA: 0 COWS: NA  Musculoskeletal: Strength & Muscle Tone: Normal Gait & Station: Normal Patient leans: Normal  Labs: No new labs since admission.  Assessment/Plan:  Principal Problem:   Severe episode of recurrent major depressive disorder, with psychotic features (HCC)  Marisabel Manka is a 20 y.o. adult with a PMH of chronic MDD and multiple suicide attempts who presents for evaluation after IVC for a suicide attempt via overdose with Atarax and Xanax.  #MDD recurrent Severe with psychotic features (r/o substance induced depressive d/o vs substance induced psychotic d/o, r/o bipolar d/o MRE depressed with psychotic features); r/o PTSD; cluster B  traits Patient endorses improving mood and symptoms of psychosis. Sleep and appetite are stable. They deny SI/HI. They have experienced some tardive dyskinesia-like symptoms of mild tongue fasciculations and left hand and foot tremors. They were unaware of their symptoms. They have not had any other medication side effects. For disposition, they are excited to return home with their family, and their family are excited as well. -Continue Zoloft 50 mg QHS for depressive symptoms -Discontinue Seroquel 150 mg QHS due to TD-like symptoms -Start Cogentin 0.5 mg BID for today only and assess improvement of TD-like symptoms -If improved, will plan for discharge tomorrow  #Cannabis use d/o; r/o alcohol use d/o Patient has not experienced any symptoms of withdrawal, such as tremors, delirium, or seizures. -Continue CIWA  -Continue Ativan 1 mg q6 PRN CIWA>10 -Continue Thiamine 100 mg daily -Continue multivitamin daily    -Continue PRN's: Tylenol, Maalox, Atarax, Milk of Magnesia, Trazodone  Signed: Samantha Crimes, MS3 [facility] 04/04/21, 10:35 AM

## 2021-04-04 NOTE — [facility] Group Notes (Signed)
Occupational Therapy Group Note Date: 04/04/2021 Group Topic/Focus: Feelings Management  Group Description: Group encouraged increased engagement and participation through discussion focused on Building Happiness. Patients were provided a handout and reviewed therapeutic strategies to build happiness including identifying gratitudes, random acts of kindness, exercise, meditation, positive journaling, and fostering relationships. Patients engaged in discussion and encouraged to reflect on each strategy and their experiences.  Therapeutic Goal(s): Identify strategies to build happiness. Identify and implement therapeutic strategies to improve overall mood. Practice and identify gratitudes, random acts of kindness, exercise, meditation, positive journaling, and fostering relationships Participation Level: Active   Participation Quality: Independent   Behavior: Calm, Cooperative, and Interactive   Speech/Thought Process: Focused   Affect/Mood: Euthymic   Insight: Moderate   Judgement: Moderate   Individualization: Adriana Jackson was active in their participation of group discussion/activity. Pt identified "my Adriana Jackson" as something that currently brings them happiness. Appeared open and receptive to additional strategies reviewed.   Modes of Intervention: Activity, Discussion, Education, and Socialization  Patient Response to Interventions:  Attentive, Engaged, and Receptive   Plan: Continue to engage patient in OT groups 2 - 3x/week.  04/04/2021  Donne Hazel, MOT, OTR/L

## 2021-04-04 NOTE — [facility] Group Notes (Signed)
Type of Therapy and Topic:  Group Therapy - Healthy vs Unhealthy Coping Skills   Participation Level: Active   Description of Group The focus of this group was to determine what unhealthy coping techniques typically are used by group members and what healthy coping techniques would be helpful in coping with various problems. Patients were guided in becoming aware of the differences between healthy and unhealthy coping techniques. Patients were asked to identify 2-3 healthy coping skills they would like to learn to use more effectively.   Therapeutic Goals 1. Patients learned that coping is what human beings do all day long to deal with various situations in their lives 2. Patients defined and discussed healthy vs unhealthy coping techniques 3. Patients identified their preferred coping techniques and identified whether these were healthy or unhealthy 4. Patients determined 2-3 healthy coping skills they would like to become more familiar with and use more often. 5. Patients provided support and ideas to each other     Summary of Patient Progress: Due to the acuity and complex discharge plans, group was not held. Patient was provided therapeutic worksheets and asked to meet with CSW as needed.  Adynn Caseres, LCSWA Clinicial Social Worker [facility]  

## 2021-04-04 NOTE — Progress Notes (Signed)
Recreation Therapy Notes  Date: 7.11.22 Time: 0930 Location: [address] Dayroom  Group Topic: Stress Management   Goal Area(s) Addresses:  Patient will actively participate in stress management techniques presented during session.  Patient will successfully identify benefit of practicing stress management post d/c.   Behavioral Response: Appropriate  Intervention: Guided exercise with ambient sound and script  Activity :Guided Imagery  LRT provided education, instruction, and demonstration on practice of visualization via guided imagery. Patient was asked to participate in the technique introduced during session. LRT also debriefed including topics of mindfulness, stress management and specific scenarios each patient could use these techniques. Patients were given suggestions of ways to access scripts post d/c and encouraged to explore Youtube and other apps available on smartphones, tablets, and computers.   Education:  Stress Management, Discharge Planning.   Education Outcome: Acknowledges education  Clinical Observations/Feedback: Patient actively engaged in technique introduced, expressed no concerns.      Caroll Rancher, LRT/CTRS         Lillia Abed, Brenen Beigel A 04/04/2021 10:59 AM

## 2021-04-04 NOTE — Progress Notes (Signed)
[facility] Group Notes:  (Nursing/MHT/Case Management/Adjunct)  Date:  04/04/2021  Time:  2015  Type of Therapy:   wrap up group  Participation Level:  Active  Participation Quality:  Appropriate, Attentive, Sharing, and Supportive  Affect:  Anxious  Cognitive:  Alert  Insight:  Improving  Engagement in Group:  Engaged  Modes of Intervention:  Clarification, Education, and Support  Summary of Progress/Problems: Positive thinking and positive change were discussed.   Marcille Buffy 04/04/2021, 9:10 PM

## 2021-04-04 NOTE — Progress Notes (Signed)
   04/04/21 1200  Psych Admission Type (Psych Patients Only)  Admission Status Involuntary  Psychosocial Assessment  Patient Complaints Anxiety  Eye Contact Fair  Facial Expression Flat  Affect Appropriate to circumstance  Speech Logical/coherent  Interaction Cautious  Motor Activity Slow  Appearance/Hygiene Unremarkable  Behavior Characteristics Cooperative  Mood Pleasant  Thought Process  Coherency Concrete thinking  Content WDL  Delusions None reported or observed;WDL  Perception WDL  Hallucination None reported or observed  Judgment Poor  Confusion None  Danger to Self  Current suicidal ideation? Denies  Self-Injurious Behavior No self-injurious ideation or behavior indicators observed or expressed   Agreement Not to Harm Self Yes  Description of Agreement verbal  Danger to Others  Danger to Others None reported or observed

## 2021-04-04 NOTE — Progress Notes (Signed)
   04/04/21 2300  Psych Admission Type (Psych Patients Only)  Admission Status Involuntary  Psychosocial Assessment  Patient Complaints Anxiety  Eye Contact Fair  Facial Expression Flat  Affect Appropriate to circumstance  Speech Logical/coherent  Interaction Cautious  Motor Activity Slow  Appearance/Hygiene Unremarkable  Behavior Characteristics Cooperative  Mood Pleasant  Thought Process  Coherency Concrete thinking  Content WDL  Delusions None reported or observed  Perception WDL  Hallucination None reported or observed  Judgment Impaired  Confusion None  Danger to Self  Current suicidal ideation? Denies  Self-Injurious Behavior No self-injurious ideation or behavior indicators observed or expressed   Agreement Not to Harm Self Yes  Description of Agreement verbal  Danger to Others  Danger to Others None reported or observed  D: Patient in dayroom reports they had a good day. Pt reports they are tolerating medication well. A: Medications administered as prescribed. Support and encouragement provided as needed.  R: Patient remains safe on the unit. Will continue to monitor for safety and stability.

## 2021-04-05 MED ORDER — HYDROXYZINE HCL 25 MG PO TABS: 25.0000 mg | ORAL_TABLET | Freq: Three times a day (TID) | ORAL | 0 refills | Status: DC | PRN

## 2021-04-05 MED ORDER — SERTRALINE HCL 50 MG PO TABS: 50.0000 mg | ORAL_TABLET | Freq: Every day | ORAL | 0 refills | Status: DC

## 2021-04-05 NOTE — BHH Group Notes (Signed)
ADULT GRIEF GROUP NOTE:   Spiritual care group on grief and loss facilitated by chaplain Dyanne Carrel, [facility]   Group Goal:   Support / Education around grief and loss   Members engage in facilitated group support and psycho-social education.   Group Description:   Following introductions and group rules, group members engaged in facilitated group dialog and support around topic of loss, with particular support around experiences of loss in their lives. Group Identified types of loss (relationships / self / things) and identified patterns, circumstances, and changes that precipitate losses. Reflected on thoughts / feelings around loss, normalized grief responses, and recognized variety in grief experience. Group noted Worden's four tasks of grief in discussion.   Group drew on Adlerian / Rogerian, narrative, MI,   Patient Progress: Adriana Jackson participated in the group in the beginning, but left after a short while.  Chaplain Dyanne Carrel, Bcc Pager, [phone] 11:50 AM

## 2021-04-05 NOTE — BHH Suicide Risk Assessment (Signed)
[facility] Discharge Suicide Risk Assessment   Principal Problem: Severe episode of recurrent major depressive disorder, with psychotic features [facility]) Discharge Diagnoses: Principal Problem:   Severe episode of recurrent major depressive disorder, with psychotic features (HCC)   Total Time spent with patient: 15 minutes  Musculoskeletal: Strength & Muscle Tone: within normal limits Gait & Station: normal Patient leans: N/A  Psychiatric Specialty Exam  Presentation  General Appearance: Appropriate for Environment  Eye Contact:Good  Speech:Clear and Coherent; Normal Rate  Speech Volume:Normal  Handedness: No data recorded  Mood and Affect  Mood:Euthymic  Duration of Depression Symptoms: Greater than two weeks  Affect:Appropriate   Thought Process  Thought Processes:Coherent; Goal Directed  Descriptions of Associations:Intact  Orientation:Full (Time, Place and Person)  Thought Content:WDL  History of Schizophrenia/Schizoaffective disorder:No  Duration of Psychotic Symptoms:Less than six months  Hallucinations:No data recorded Ideas of Reference:None  Suicidal Thoughts:No data recorded Homicidal Thoughts:No data recorded  Sensorium  Memory:Immediate Good; Recent Good  Judgment:Fair  Insight:Fair   Executive Functions  Concentration:Good  Attention Span:Good  Recall:Good  Fund of Knowledge:Good  Language:Good   Psychomotor Activity  Psychomotor Activity: No data recorded  Assets  Assets:Communication Skills; Desire for Improvement; Physical Health; Social Support; Resilience   Sleep  Sleep: No data recorded  Physical Exam: Physical Exam Vitals and nursing note reviewed.  Constitutional:      Appearance: Normal appearance.  HENT:     Head: Normocephalic and atraumatic.  Pulmonary:     Effort: Pulmonary effort is normal.  Neurological:     General: No focal deficit present.     Mental Status: Adriana Fero "[name]" is alert and  oriented to person, place, and time.   Review of Systems  All other systems reviewed and are negative. Blood pressure 109/88, pulse 100, temperature 98.2 F (36.8 C), temperature source Oral, resp. rate 16, height 5\' 7"  (1.702 m), weight 58.1 kg, SpO2 100 %. Body mass index is 20.05 kg/m.  Mental Status Per Nursing Assessment::   On Admission:  Suicidal ideation indicated by patient, Plan includes specific time, place, or method, Intention to act on suicide plan, Suicidal ideation indicated by others, Self-harm thoughts, Belief that plan would result in death, Suicide plan, Self-harm behaviors  Demographic Factors:  Gay, lesbian, or bisexual orientation  Loss Factors: NA  Historical Factors: Impulsivity  Risk Reduction Factors:   Positive coping skills or problem solving skills  Continued Clinical Symptoms:  Depression:   Impulsivity  Cognitive Features That Contribute To Risk:  None    Suicide Risk:  Minimal: No identifiable suicidal ideation.  Patients presenting with no risk factors but with morbid ruminations; may be classified as minimal risk based on the severity of the depressive symptoms   Follow-up Information     [facility]. Go on 04/13/2021.   Why: You have an appt oon July 20th at 4pm.  Please arrive 15 min before scheduled appt and bring your insurance card and photo ID. If you need to reschedule for any reason please give 24 hours notice to avoid a fee or no show indication. Contact information: [address], [address] [place], [place]  Phone: [phone]        [facility]. Call.   Why: Clinics are not accepting walk-ins but instead are asking students to call for services. Call [phone] for Counseling Center services. Services offered are Brief Outpatient Counseling, Consultation &Outreach Activities, Psychiatric Services, Crisis Intervention, Group Therapy, and Workshops Contact  information: [phone]  Corene Cornea Student [facility] [address] [place], [place] [address]                Plan Of Care/Follow-up recommendations:  Activity:  ad lib  Antonieta Pert, MD 04/05/2021, 10:01 AM

## 2021-04-05 NOTE — Progress Notes (Signed)
Discharge Note:   Discharge Note:   Pt discharged at 12:30, left with pt's sister to home. Upon discharge, pt denies suicidal and homicidal ideation, denies hallucinations, denies feelings of depression and anxiety. Pt is calm, cooperative, pleasant, affect is bright. Pt was given discharge instructions, which include follow up appts, provider sent pt's prescriptions were sent electronically to pt's pharmacy, discharge medication education provided to pt upon discharge; pt verbalized understanding of all. All personal belongings returned to pt upon discharge.

## 2021-04-05 NOTE — Progress Notes (Signed)
  [facility] Adult Case Management Discharge Plan :  Will you be returning to the same living situation after discharge:  Yes,  Patient will be going back to living with Family  At discharge, do you have transportation home?: Yes,  patient sister, Wylodean Shimmel, will be picking patient up Do you have the ability to pay for your medications: Yes,  Medicaid  Release of information consent forms completed and in the chart;  Patient's signature needed at discharge.  Patient to Follow up at:  Follow-up Information     [facility]. Go on 04/13/2021.   Why: You have an appt oon July 20th at 4pm.  Please arrive 15 min before scheduled appt and bring your insurance card and photo ID. If you need to reschedule for any reason please give 24 hours notice to avoid a fee or no show indication. Contact information: [address] [place], [place] [address]  Phone: [phone]        [facility]. Call.   Why: Clinics are not accepting walk-ins but instead are asking students to call for services. Call [phone] for Counseling Center services. Services offered are Brief Outpatient Counseling, Consultation &Outreach Activities, Psychiatric Services, Crisis Intervention, Group Therapy, and Workshops Contact information: [phone] [employer]. [facility] [address] [place], [place] [address]                Next level of care provider has access to [facility] Link:no  Safety Planning and Suicide Prevention discussed: Yes,  with sister, Amiliana Foutz.   Have you used any form of tobacco in the last 30 days? (Cigarettes, Smokeless Tobacco, Cigars, and/or Pipes): No  Has patient been referred to the Quitline?: N/A patient is not a smoker  Patient has been referred for addiction treatment: N/A  Dontrell Stuck E Yoneko Talerico, LCSW 04/05/2021, 9:27 AM
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
## 2021-06-04 ENCOUNTER — Telehealth: Payer: Medicaid Other | Admitting: Nurse Practitioner

## 2021-06-04 DIAGNOSIS — J45909 Unspecified asthma, uncomplicated: Secondary | ICD-10-CM | POA: Diagnosis not present

## 2021-06-05 MED ORDER — ALBUTEROL SULFATE HFA 108 (90 BASE) MCG/ACT IN AERS: 2.0000 | INHALATION_SPRAY | Freq: Four times a day (QID) | RESPIRATORY_TRACT | 0 refills | Status: DC | PRN

## 2021-06-05 NOTE — Progress Notes (Signed)
Visit for Asthma  Based on what you have shared with me, it looks like you may have a flare up of your asthma.  Asthma is a chronic (ongoing) lung disease which results in airway obstruction, inflammation and hyper-responsiveness. I recommend that you follow-up with your primary care physician immediately based on your symptoms. In the interim, I will prescribe an inhaler for your symptoms. I also recommend that you stop smoking as this is contraindicated in patients who have asthma.  Asthma symptoms vary from person to person, with common symptoms including nighttime awakening and decreased ability to participate in normal activities as a result of shortness of breath. It is often triggered by changes in weather, changes in the season, changes in air temperature, or inside (home, school, daycare or work) allergens such as animal dander, mold, mildew, woodstoves or cockroaches.   It can also be triggered by hormonal changes, extreme emotion, physical exertion or an upper respiratory tract illness.     It is important to identify the trigger, and then eliminate or avoid the trigger if possible.   If you have been prescribed medications to be taken on a regular basis, it is important to follow the asthma action plan and to follow guidelines to adjust medication in response to increasing symptoms of decreased peak expiratory flow rate  Treatment: I have prescribed: Albuterol (Proventil HFA; Ventolin HFA) 108 (90 Base) MCG/ACT Inhaler 2 puffs into the lungs every six hours as needed for wheezing or shortness of breath  HOME CARE Only take medications as instructed by your medical team. Consider wearing a mask or scarf to improve breathing air temperature have been shown to decrease irritation and decrease exacerbations Get rest. Taking a steamy shower or using a humidifier may help nasal  congestion sand ease sore throat pain. You can place a towel over your head and breathe in the steam from hot water coming from a faucet. Using a saline nasal spray works much the same way.  Cough drops, hare candies and sore throat lozenges may ease your cough.  Avoid close contacts especially the very you and the elderly Cover your mouth if you cough or sneeze Always remember to wash your hands.    GET HELP RIGHT AWAY IF: You develop worsening symptoms; breathlessness at rest, drowsy, confused or agitated, unable to speak in full sentences You have coughing fits You develop a severe headache or visual changes You develop shortness of breath, difficulty breathing or start having chest pain Your symptoms persist after you have completed your treatment plan If your symptoms do not improve within 10 days  MAKE SURE YOU Understand these instructions. Will watch your condition. Will get help right away if you are not doing well or get worse.   Your e-visit answers were reviewed by a board certified advanced clinical practitioner to complete your personal care plan, Depending upon the condition, your plan could have included both over the counter or prescription medications.   Please review your pharmacy choice. Your safety is important to [place]. If you have drug allergies check your prescription carefully.  You can use MyChart to ask questions about today's visit, request a non-urgent  call back, or ask for a work or school excuse for 24 hours related to this e-Visit. If it has been greater than 24 hours you will need to follow up with your provider, or enter a new e-Visit to address those concerns.   You will get an e-mail in the next two days asking about  your experience. I hope that your e-visit has been valuable and will speed your recovery. Thank you for using e-visits.   I have spent at least 5 minutes reviewing and documenting in the patient's chart.

## 2021-07-25 ENCOUNTER — Encounter: Payer: Medicaid Other | Admitting: Obstetrics and Gynecology

## 2021-07-28 ENCOUNTER — Telehealth: Payer: Medicaid Other | Admitting: Physician Assistant

## 2021-07-28 DIAGNOSIS — R6889 Other general symptoms and signs: Secondary | ICD-10-CM

## 2021-07-28 MED ORDER — BENZONATATE 100 MG PO CAPS: 100.0000 mg | ORAL_CAPSULE | Freq: Three times a day (TID) | ORAL | 0 refills | Status: DC | PRN

## 2021-07-28 MED ORDER — IPRATROPIUM BROMIDE 0.03 % NA SOLN: 2.0000 | Freq: Two times a day (BID) | NASAL | 12 refills | Status: DC

## 2021-07-28 NOTE — Progress Notes (Signed)
E visit for Flu like symptoms   We are sorry that you are not feeling well.  Here is how we plan to help! Based on what you have shared with me it looks like you may have a respiratory virus that may be influenza.or flu-like illness. Unfortunately, anti-viral medication for influenza have to be started within the first 48 hours. You had indicated your symptoms have been present longer, thus the anti-virals would be ineffective.  Influenza or "the flu" is   an infection caused by a respiratory virus. The flu virus is highly contagious and persons who did not receive their yearly flu vaccination may "catch" the flu from close contact.  We have anti-viral medications to treat the viruses that cause this infection. They are not a "cure" and only shorten the course of the infection. These prescriptions are most effective when they are given within the first 2 days of "flu" symptoms. Antiviral medication are indicated if you have a high risk of complications from the flu. You should  also consider an antiviral medication if you are in close contact with someone who is at risk. These medications can help patients avoid complications from the flu  but have side effects that you should know. Possible side effects from Tamiflu or oseltamivir include nausea, vomiting, diarrhea, dizziness, headaches, eye redness, sleep problems or other respiratory symptoms. You should not take Tamiflu if you have an allergy to oseltamivir or any to the ingredients in Tamiflu.  Based upon your symptoms and potential risk factors I recommend that you follow the flu symptoms recommendation that I have listed below.  I will prescribe Ipratropium nasal spray for nasal congestion and drainage. Tessalon perles for cough and congestion.  ANYONE WHO HAS FLU SYMPTOMS SHOULD: Stay home. The flu is highly contagious and going out or to work exposes others! Be sure to drink plenty of fluids. Water is fine as well as fruit juices, sodas and  electrolyte beverages. You may want to stay away from caffeine or alcohol. If you are nauseated, try taking small sips of liquids. How do you know if you are getting enough fluid? Your urine should be a pale yellow or almost colorless. Get rest. Taking a steamy shower or using a humidifier may help nasal congestion and ease sore throat pain. Using a saline nasal spray works much the same way. Cough drops, hard candies and sore throat lozenges may ease your cough. Line up a caregiver. Have someone check on you regularly.   GET HELP RIGHT AWAY IF: You cannot keep down liquids or your medications. You become short of breath Your fell like you are going to pass out or loose consciousness. Your symptoms persist after you have completed your treatment plan MAKE SURE YOU  Understand these instructions. Will watch your condition. Will get help right away if you are not doing well or get worse.  Your e-visit answers were reviewed by a board certified advanced clinical practitioner to complete your personal care plan.  Depending on the condition, your plan could have included both over the counter or prescription medications.  If there is a problem please reply  once you have received a response from your provider.  Your safety is important to [place].  If you have drug allergies check your prescription carefully.    You can use MyChart to ask questions about today's visit, request a non-urgent call back, or ask for a work or school excuse for 24 hours related to this e-Visit. If it has  been greater than 24 hours you will need to follow up with your provider, or enter a new e-Visit to address those concerns.  You will get an e-mail in the next two days asking about your experience.  I hope that your e-visit has been valuable and will speed your recovery. Thank you for using e-visits.  I provided 6 minutes of non face-to-face time during this encounter for chart review and documentation.

## 2021-08-01 ENCOUNTER — Telehealth: Payer: Medicaid Other | Admitting: Family Medicine

## 2021-08-01 DIAGNOSIS — R42 Dizziness and giddiness: Secondary | ICD-10-CM

## 2021-08-02 NOTE — Progress Notes (Signed)
Recent treatment on 11/3 for flu like symptoms- now with lightheadness. Recommend in person to check for dehydration/ BP and HR check.   [place]

## 2021-08-10 ENCOUNTER — Telehealth: Payer: Medicaid Other | Admitting: Physician Assistant

## 2021-08-10 DIAGNOSIS — H00019 Hordeolum externum unspecified eye, unspecified eyelid: Secondary | ICD-10-CM | POA: Diagnosis not present

## 2021-08-10 MED ORDER — NEOMYCIN-POLYMYXIN-HC 3.5-10000-1 OP SUSP: OPHTHALMIC | 0 refills | Status: DC

## 2021-08-10 NOTE — Progress Notes (Signed)
  E-Visit for Stye   We are sorry that you are not feeling well. Here is how we plan to help!  Based on what you have shared with me it looks like you have a stye.  A stye is an inflammation of the eyelid.  It is often a red, painful lump near the edge of the eyelid that may look like a boil or a pimple.  A stye develops when an infection occurs at the base of an eyelash.   We have made appropriate suggestions for you based upon your presentation: Your symptoms may indicate an infection of the sclera.  The use of anti-inflammatory and antibiotic eye drops for a week will help resolve this condition.  I have sent in neomycin-polymyxin HC opthalmic suspension, two to three drops in the affected eye every 4 hours.  If your symptoms do not improve over the next two to three days you should be seen in your doctor's office.  HOME CARE:  Wash your hands often! Let the stye open on its own. Don't squeeze or open it. Don't rub your eyes. This can irritate your eyes and let in bacteria.  If you need to touch your eyes, wash your hands first. Don't wear eye makeup or contact lenses until the area has healed.  GET HELP RIGHT AWAY IF:  Your symptoms do not improve. You develop blurred or loss of vision. Your symptoms worsen (increased discharge, pain or redness).   Thank you for choosing an e-visit.  Your e-visit answers were reviewed by a board certified advanced clinical practitioner to complete your personal care plan. Depending upon the condition, your plan could have included both over the counter or prescription medications.  Please review your pharmacy choice. Make sure the pharmacy is open so you can pick up prescription now. If there is a problem, you may contact your provider through MyChart messaging and have the prescription routed to another pharmacy.  Your safety is important to us. If you have drug allergies check your prescription carefully.   For the next 24 hours you can use  MyChart to ask questions about today's visit, request a non-urgent call back, or ask for a work or school excuse. You will get an email in the next two days asking about your experience. I hope that your e-visit has been valuable and will speed your recovery.  

## 2021-08-10 NOTE — Progress Notes (Signed)
I have spent 5 minutes in review of e-visit questionnaire, review and updating patient chart, medical decision making and response to patient.   Litzi Binning Cody Iban Utz, PA-C    

## 2021-08-12 ENCOUNTER — Telehealth: Payer: Medicaid Other | Admitting: Physician Assistant

## 2021-08-12 DIAGNOSIS — J3089 Other allergic rhinitis: Secondary | ICD-10-CM

## 2021-08-12 MED ORDER — LEVOCETIRIZINE DIHYDROCHLORIDE 5 MG PO TABS: 5.0000 mg | ORAL_TABLET | Freq: Every evening | ORAL | 0 refills | Status: DC

## 2021-08-12 MED ORDER — OLOPATADINE HCL 0.2 % OP SOLN: 1.0000 [drp] | Freq: Two times a day (BID) | OPHTHALMIC | 0 refills | Status: DC

## 2021-08-12 MED ORDER — FLUTICASONE PROPIONATE 50 MCG/ACT NA SUSP: 2.0000 | Freq: Every day | NASAL | 0 refills | Status: DC

## 2021-08-12 NOTE — Progress Notes (Signed)
E visit for Allergic Rhinitis We are sorry that you are not feeling well.  Here is how we plan to help!  Based on what you have shared with me it looks like you have Allergic Rhinitis.  Rhinitis is when a reaction occurs that causes nasal congestion, runny nose, sneezing, and itching.  Most types of rhinitis are caused by an inflammation and are associated with symptoms in the eyes ears or throat. There are several types of rhinitis.  The most common are acute rhinitis, which is usually caused by a viral illness, allergic or seasonal rhinitis, and nonallergic or year-round rhinitis.  Nasal allergies occur certain times of the year.  Allergic rhinitis is caused when allergens in the air trigger the release of histamine in the body.  Histamine causes itching, swelling, and fluid to build up in the fragile linings of the nasal passages, sinuses and eyelids.  An itchy nose and clear discharge are common.  I recommend the following over the counter treatments: You should take a daily dose of antihistamine and Xyzal 5 mg take 1 tablet daily; Stop claritin and zyrtec  I also would recommend a nasal spray: Flonase 2 sprays into each nostril once daily  You may also benefit from eye drops such as: Pataday Allergy Eye drops 1 drop each eye twice daily  HOME CARE:  You can use an over-the-counter saline nasal spray as needed Avoid areas where there is heavy dust, mites, or molds Stay indoors on windy days during the pollen season Keep windows closed in home, at least in bedroom; use air conditioner. Use high-efficiency house air filter Keep windows closed in car, turn AC on re-circulate Avoid playing out with dog during pollen season  GET HELP RIGHT AWAY IF:  If your symptoms do not improve within 10 days You become short of breath You develop yellow or green discharge from your nose for over 3 days You have coughing fits  MAKE SURE YOU:  Understand these instructions Will watch your  condition Will get help right away if you are not doing well or get worse  Thank you for choosing an e-visit. Your e-visit answers were reviewed by a board certified advanced clinical practitioner to complete your personal care plan. Depending upon the condition, your plan could have included both over the counter or prescription medications. Please review your pharmacy choice. Be sure that the pharmacy you have chosen is open so that you can pick up your prescription now.  If there is a problem you may message your provider in MyChart to have the prescription routed to another pharmacy. Your safety is important to [place]. If you have drug allergies check your prescription carefully.  For the next 24 hours, you can use MyChart to ask questions about today's visit, request a non-urgent call back, or ask for a work or school excuse from your e-visit provider. You will get an email in the next two days asking about your experience. I hope that your e-visit has been valuable and will speed your recovery.   I provided 6 minutes of non face-to-face time during this encounter for chart review and documentation.

## 2021-08-14 ENCOUNTER — Emergency Department (HOSPITAL_COMMUNITY)
Admission: EM | Admit: 2021-08-14 | Discharge: 2021-08-14 | Disposition: A | Discharge status: Home / Self Care | Payer: Medicaid Other | Attending: Emergency Medicine | Admitting: Emergency Medicine

## 2021-08-14 ENCOUNTER — Emergency Department (HOSPITAL_COMMUNITY): Payer: Medicaid Other

## 2021-08-14 DIAGNOSIS — R079 Chest pain, unspecified: Secondary | ICD-10-CM | POA: Diagnosis present

## 2021-08-14 DIAGNOSIS — R42 Dizziness and giddiness: Secondary | ICD-10-CM | POA: Insufficient documentation

## 2021-08-14 DIAGNOSIS — Z20822 Contact with and (suspected) exposure to covid-19: Secondary | ICD-10-CM | POA: Diagnosis not present

## 2021-08-14 DIAGNOSIS — J45909 Unspecified asthma, uncomplicated: Secondary | ICD-10-CM | POA: Diagnosis not present

## 2021-08-14 DIAGNOSIS — N10 Acute tubulo-interstitial nephritis: Secondary | ICD-10-CM | POA: Diagnosis not present

## 2021-08-14 DIAGNOSIS — R059 Cough, unspecified: Secondary | ICD-10-CM | POA: Insufficient documentation

## 2021-08-14 DIAGNOSIS — Z7951 Long term (current) use of inhaled steroids: Secondary | ICD-10-CM | POA: Diagnosis not present

## 2021-08-14 LAB — URINALYSIS, ROUTINE W REFLEX MICROSCOPIC
Bilirubin Urine: NEGATIVE
Glucose, UA: NEGATIVE mg/dL
Hgb urine dipstick: NEGATIVE
Ketones, ur: 5 mg/dL — AB
Nitrite: NEGATIVE
Protein, ur: NEGATIVE mg/dL
Specific Gravity, Urine: 1.026 (ref 1.005–1.030)
pH: 5 (ref 5.0–8.0)

## 2021-08-14 LAB — BASIC METABOLIC PANEL
Anion gap: 7 (ref 5–15)
BUN: 17 mg/dL (ref 6–20)
CO2: 23 mmol/L (ref 22–32)
Calcium: 9.4 mg/dL (ref 8.9–10.3)
Chloride: 107 mmol/L (ref 98–111)
Creatinine, Ser: 0.72 mg/dL (ref 0.44–1.00)
GFR, Estimated: >60 mL/min (ref >60)
Glucose, Bld: 97 mg/dL (ref 70–99)
Potassium: 4.3 mmol/L (ref 3.5–5.1)
Sodium: 137 mmol/L (ref 135–145)

## 2021-08-14 LAB — TROPONIN I (HIGH SENSITIVITY): Troponin I (High Sensitivity): <2 ng/L (ref <18)

## 2021-08-14 LAB — RESP PANEL BY RT-PCR (FLU A&B, COVID) ARPGX2
Influenza A by PCR: NEGATIVE
Influenza B by PCR: NEGATIVE
SARS Coronavirus 2 by RT PCR: NEGATIVE

## 2021-08-14 LAB — CBC
HCT: 38.8 % (ref 36.0–46.0)
Hemoglobin: 13.3 g/dL (ref 12.0–15.0)
MCH: 30.8 pg (ref 26.0–34.0)
MCHC: 34.3 g/dL (ref 30.0–36.0)
MCV: 89.8 fL (ref 80.0–100.0)
Platelets: 375 10*3/uL (ref 150–400)
RBC: 4.32 MIL/uL (ref 3.87–5.11)
RDW: 12.5 % (ref 11.5–15.5)
WBC: 11.5 10*3/uL — ABNORMAL HIGH (ref 4.0–10.5)
nRBC: 0 % (ref 0.0–0.2)

## 2021-08-14 LAB — I-STAT BETA HCG BLOOD, ED (MC, WL, AP ONLY): I-stat hCG, quantitative: <5 m[IU]/mL (ref <5)

## 2021-08-14 MED ORDER — CIPROFLOXACIN HCL 500 MG PO TABS: 500.0000 mg | ORAL_TABLET | Freq: Two times a day (BID) | ORAL | 0 refills | Status: AC

## 2021-08-14 MED ORDER — KETOROLAC TROMETHAMINE 15 MG/ML IJ SOLN
15.0000 mg | Freq: Once | INTRAMUSCULAR | Status: AC
  Administered 2021-08-14: 15 mg via INTRAMUSCULAR
  Filled 2021-08-14: qty 1

## 2021-08-14 NOTE — Discharge Instructions (Addendum)
You were seen in the emergency department for chest pain.  As we discussed the labs that we got to evaluate your heart and lungs were all normal.  I am prescribing you an antibiotic for urinary tract infection, its important that you finish the entire course.  I also recommend good fluid intake.  You can take ibuprofen or Tylenol at home as needed for pain. Continue to monitor how you're doing and return to the ER for new or worsening symptoms such as fever, difficulty breathing, vomiting.   It has been a pleasure seeing and caring for you today and I hope you start feeling better soon!

## 2021-08-14 NOTE — ED Provider Notes (Signed)
[facility]-EMERGENCY DEPT Provider Note   CSN: [id number] Arrival date & time: 08/14/21  1153     History Chief Complaint  Patient presents with   Chest Pain    Adriana Jackson is a 20 y.o. adult with history of asthma who presents to the emergency department with several complaints.  Patient describes acute onset of chest pain that started at 9 PM last night.  They report that the pain is sharp and starts in the middle of the chest after exertion, and is now all over the body.  Patient believes that pain could be related to increased albuterol use on top of prescribed propranolol for anxiety.  Currently reporting feels like they "get hit by a truck".  Also complaining of dizziness and lightheadedness as well as cough.  Patient is also complaining of cloudy urine with bilateral flank pain.  States did not get flu shot this year, denies any known sick contacts.  Patient reports more frequent inhaler use in the winter months.    Chest Pain Associated symptoms: cough   Associated symptoms: no abdominal pain, no fever, no nausea, no shortness of breath and no vomiting       Past Medical History:  Diagnosis Date   Asthma     Patient Active Problem List   Diagnosis Date Noted   Severe episode of recurrent major depressive disorder, with psychotic features (HCC)    Alcohol use disorder, moderate, dependence (HCC)    Cannabis use disorder, moderate, dependence (HCC)    Vitamin D deficiency 09/15/2020    No past surgical history on file.   OB History   No obstetric history on file.     No family history on file.  Social History   Tobacco Use   Smoking status: Never   Smokeless tobacco: Never  Vaping Use   Vaping Use: Never used  Substance Use Topics   Alcohol use: Yes    Alcohol/week: 4.0 standard drinks    Types: 4 Shots of liquor per week    Comment: 4 shots/day   Drug use: Yes    Types: Marijuana    Comment: delta 8    Home Medications Prior  to Admission medications   Medication Sig Start Date End Date Taking? Authorizing Provider  ciprofloxacin (CIPRO) 500 MG tablet Take 1 tablet (500 mg total) by mouth every 12 (twelve) hours for 7 days. 08/14/21 08/21/21 Yes Consuella Scurlock T, PA-C  albuterol (VENTOLIN HFA) 108 (90 Base) MCG/ACT inhaler Inhale 2 puffs into the lungs every 6 (six) hours as needed for wheezing or shortness of breath. 06/05/21   Benay PikeLeath, Christie Janell, NP  fluticasone (FLONASE) 50 MCG/ACT nasal spray Place 2 sprays into both nostrils daily. 08/12/21   Margaretann LovelessBurnette, Jennifer M, PA-C  hydrOXYzine (ATARAX/VISTARIL) 25 MG tablet Take 1 tablet (25 mg total) by mouth 3 (three) times daily as needed for anxiety. 04/05/21   Antonieta Pertlary, Greg Lawson, MD  ipratropium (ATROVENT) 0.03 % nasal spray Place 2 sprays into both nostrils every 12 (twelve) hours. 07/28/21   Margaretann LovelessBurnette, Jennifer M, PA-C  levocetirizine (XYZAL) 5 MG tablet Take 1 tablet (5 mg total) by mouth every evening. 08/12/21   Margaretann LovelessBurnette, Jennifer M, PA-C  Olopatadine HCl 0.2 % SOLN Apply 1 drop to eye in the morning and at bedtime. 08/12/21   Margaretann LovelessBurnette, Jennifer M, PA-C  sertraline (ZOLOFT) 50 MG tablet Take 1 tablet (50 mg total) by mouth at bedtime. 04/05/21   Antonieta Pertlary, Greg Lawson, MD    Allergies  Patient has no known allergies.  Review of Systems   Review of Systems  Constitutional:  Positive for chills. Negative for fever.  HENT:  Negative for congestion and sore throat.   Respiratory:  Positive for cough. Negative for shortness of breath.   Cardiovascular:  Positive for chest pain.  Gastrointestinal:  Negative for abdominal pain, constipation, diarrhea, nausea and vomiting.  Genitourinary:  Positive for flank pain. Negative for dysuria and hematuria.  Musculoskeletal:  Positive for myalgias.  All other systems reviewed and are negative.  Physical Exam Updated Vital Signs BP (!) 123/95   Pulse 82   Temp 98 F (36.7 C) (Oral)   Resp 18   Ht 5\' 8"  (1.727 m)   LMP  07/15/2021   SpO2 100%   BMI 19.46 kg/m   Physical Exam Vitals and nursing note reviewed.  Constitutional:      Appearance: Normal appearance.  HENT:     Head: Normocephalic and atraumatic.  Eyes:     Conjunctiva/sclera: Conjunctivae normal.  Cardiovascular:     Rate and Rhythm: Normal rate and regular rhythm.  Pulmonary:     Effort: Pulmonary effort is normal. No respiratory distress.     Breath sounds: Normal breath sounds.  Abdominal:     General: There is no distension.     Palpations: Abdomen is soft.     Tenderness: There is no abdominal tenderness.  Skin:    General: Skin is warm and dry.  Neurological:     General: No focal deficit present.     Mental Status: Adriana Messenger "[name]" is alert.    ED Results / Procedures / Treatments   Labs (all labs ordered are listed, but only abnormal results are displayed) Labs Reviewed  CBC - Abnormal; Notable for the following components:      Result Value   WBC 11.5 (*)    All other components within normal limits  URINALYSIS, ROUTINE W REFLEX MICROSCOPIC - Abnormal; Notable for the following components:   APPearance CLOUDY (*)    Ketones, ur 5 (*)    Leukocytes,Ua MODERATE (*)    Bacteria, UA RARE (*)    All other components within normal limits  RESP PANEL BY RT-PCR (FLU A&B, COVID) ARPGX2  BASIC METABOLIC PANEL  I-STAT BETA HCG BLOOD, ED ([facility], [facility], [facility] ONLY)  TROPONIN I (HIGH SENSITIVITY)    EKG None  Radiology DG Chest 2 View  Result Date: 08/14/2021 CLINICAL DATA:  Chest pain EXAM: CHEST - 2 VIEW COMPARISON:  Chest radiograph 12/24/2019 FINDINGS: The cardiomediastinal contours are within normal limits. The lungs are clear. No pneumothorax or pleural effusion. No acute finding in the visualized skeleton. IMPRESSION: No acute cardiopulmonary process. Electronically Signed   By: 12/26/2019 M.D.   On: 08/14/2021 13:21    Procedures Procedures   Medications Ordered in ED Medications  ketorolac (TORADOL) 15  MG/ML injection 15 mg (15 mg Intramuscular Given 08/14/21 1455)    ED Course  I have reviewed the triage vital signs and the nursing notes.  Pertinent labs & imaging results that were available during my care of the patient were reviewed by me and considered in my medical decision making (see chart for details).    MDM Rules/Calculators/A&P                           Patient is a 20 year old adult who presents to the emergency department complaining of chest pain, body aches, nonproductive cough,  and lightheadedness since last night.  Patient reports increased albuterol inhaler usage in the past several weeks due to the season change.  Patient reports abrupt onset of sharp stabbing chest pain at 9 PM last night, and is waxing and waning in intensity.  On exam patient is afebrile, not tachycardic, not hypoxic, no acute distress.  Lung sounds are clear in all fields, and heart is regular rate and rhythm. Heart score for major cardiac events is 1.   Urinalysis positive for urinary tract infection, in the setting of flank pain will treat as acute pyelonephritis with antibiotics.  Toradol given for pain.  Patient is to be discharged with recommendation to follow up with PCP in regards to today's hospital visit. Chest pain is not likely of cardiac or pulmonary etiology d/t presentation, PERC negative, VSS, no tracheal deviation, no JVD or new murmur, RRR, breath sounds equal bilaterally, EKG without acute abnormalities, negative troponin, and negative CXR. Pt has been advised to return to the ED if CP becomes exertional, associated with diaphoresis or nausea, radiates to left jaw/arm, worsens or becomes concerning in any way. Pt appears reliable for follow up and is agreeable to discharge.   Final Clinical Impression(s) / ED Diagnoses Final diagnoses:  Nonspecific chest pain  Acute pyelonephritis    Rx / DC Orders ED Discharge Orders          Ordered    ciprofloxacin (CIPRO) 500 MG tablet   Every 12 hours        08/14/21 1529             Jarrod Mcenery T, PA-C 08/14/21 1602    Sherwood Gambler, MD 08/15/21 1502

## 2021-08-14 NOTE — ED Triage Notes (Signed)
Patient reports chest pain in center chest starting at 9pm yesterday, has spread all over chest. Patient states they feel it is related to use of propanolol and albuterol simultaneously. Pain rated 8/10

## 2021-08-14 NOTE — ED Provider Notes (Signed)
Emergency Medicine Provider Triage Evaluation Note  Adriana Jackson , a 20 y.o. adult  was evaluated in triage.  Pt complains of chest pain.  They report that their pain started at 9 pm last night.  The pain started in the middle of the chest and is now all over the body.  They report that the pain is now all over the body.  The pain started shorted after they were physically exerting themselves.  They currently feel like they just got hit by a truck.  They state that they feel slightly dizzy and lightheaded.  They have been coughing.  They did not get a flu shot this year.  They deny any known sick contacts.  Review of Systems  Positive: Cough, myalgias/arthralgias, chest discomfort. Negative: Syncope, abdominal pain  Physical Exam  BP (!) 126/97 (BP Location: Left Arm)   Pulse 81   Temp 98 F (36.7 C) (Oral)   Resp 16   Ht 5\' 8"  (1.727 m)   SpO2 100%   BMI 19.46 kg/m  Gen:   Awake, no distress   Resp:  Normal effort  MSK:   Moves extremities without difficulty  Other:  Slight wheezes left-sided lung fields  Medical Decision Making  Medically screening exam initiated at 12:24 PM.  Appropriate orders placed.  Haillie Radu was informed that the remainder of the evaluation will be completed by another provider, this initial triage assessment does not replace that evaluation, and the importance of remaining in the ED until their evaluation is complete.  Note: Portions of this report may have been transcribed using voice recognition software. Every effort was made to ensure accuracy; however, inadvertent computerized transcription errors may be present    [name], PA-C 08/14/21 1237    08/16/21, MD 08/15/21 [phone]

## 2021-09-01 ENCOUNTER — Telehealth: Payer: Medicaid Other | Admitting: Physician Assistant

## 2021-09-01 DIAGNOSIS — R109 Unspecified abdominal pain: Secondary | ICD-10-CM

## 2021-09-01 DIAGNOSIS — K625 Hemorrhage of anus and rectum: Secondary | ICD-10-CM

## 2021-09-01 NOTE — Progress Notes (Signed)
Based on what you shared with me, I feel your condition warrants further evaluation and I recommend that you be seen in a face to face visit.  Giving abdominal pain mentioned in addition to the blood noted, this warrants a further evaluation including abdominal and rectal exam so that you get the most appropriate treatment.    NOTE: There will be NO CHARGE for this eVisit   If you are having a true medical emergency please call 911.      For an urgent face to face visit, [place] has six urgent care centers for your convenience:     [facility] at [facility] Directions [phone] [address] [place], [place] [address]    [facility] Center [facility]) Get Driving Directions [phone] [address] [place], [place] [address]  [facility] Center [facility]) Get Driving Directions [phone] [address] [place],  [place]  [address]  [facility] Health Urgent Care at [facility] Get Driving Directions [phone] [address] [place], [place] [address]   [facility] Health Urgent Care at [facility] Get Driving Directions  [phone] [address] [place], [place] [address]   [facility] Health Urgent Care at [facility] Directions [phone] [address] [place], [place] [address]  Your MyChart E-visit questionnaire answers were reviewed by a board certified advanced clinical practitioner to complete your personal care plan based on your specific symptoms.  Thank you for using e-Visits.

## 2021-09-03 ENCOUNTER — Telehealth: Payer: Medicaid Other | Admitting: Emergency Medicine

## 2021-09-03 DIAGNOSIS — R109 Unspecified abdominal pain: Secondary | ICD-10-CM

## 2021-09-03 DIAGNOSIS — R102 Pelvic and perineal pain: Secondary | ICD-10-CM

## 2021-09-03 NOTE — Progress Notes (Signed)
I have spent 5 minutes in review of e-visit questionnaire, review and updating patient chart, medical decision making and response to patient.   Skarleth Delmonico, PA-C    

## 2021-09-03 NOTE — Progress Notes (Signed)
Hi Adriana Jackson, I'm sorry you are not feeling well!  Based on the symptoms you shared with me I believe you should be evaluated in person.  I am concerned about the vaginal pain, abdominal pain, and painful urination symptoms you selected.  All of these symptoms could be signs of infection and need additional testing/ examination to identify where the infection may be coming from.  I hope you feel better soon!   NOTE: There will be NO CHARGE for this eVisit   If you are having a true medical emergency please call 911.      For an urgent face to face visit, [place] has six urgent care centers for your convenience:     [facility] Health Urgent Care Center at [facility] Directions [phone] [address] [place], [place] [address]    [facility] Health Urgent Care Center [facility]) Get Driving Directions [phone] [address] [place], [place] [address]  [facility] Health Urgent Care Center [facility]) Get Driving Directions [phone] [address] [place],  [place]  [address]  [facility] Health Urgent Care at [facility] Get Driving Directions [phone] [address] [place], [place] [address]   [facility] Health Urgent Care at  [facility] Get Driving Directions  [phone] [address] [place], [place] [address]   [facility] Health Urgent Care at [facility] Directions [phone] [address] [place], [place] [address]   Your MyChart E-visit questionnaire answers were reviewed by a board certified advanced clinical practitioner to complete your personal care plan based on your specific symptoms.  Thank you for using e-Visits.

## 2021-09-04 ENCOUNTER — Ambulatory Visit (HOSPITAL_COMMUNITY)
Admission: EM | Admit: 2021-09-04 | Discharge: 2021-09-04 | Disposition: A | Discharge status: Home / Self Care | Payer: Medicaid Other | Attending: Internal Medicine | Admitting: Internal Medicine

## 2021-09-04 ENCOUNTER — Encounter (HOSPITAL_COMMUNITY): Payer: Self-pay

## 2021-09-04 ENCOUNTER — Other Ambulatory Visit: Payer: Self-pay

## 2021-09-04 DIAGNOSIS — N10 Acute tubulo-interstitial nephritis: Secondary | ICD-10-CM | POA: Diagnosis not present

## 2021-09-04 LAB — POCT URINALYSIS DIPSTICK, ED / UC
Bilirubin Urine: NEGATIVE
Glucose, UA: NEGATIVE mg/dL
Ketones, ur: NEGATIVE mg/dL
Leukocytes,Ua: NEGATIVE
Nitrite: NEGATIVE
Protein, ur: NEGATIVE mg/dL
Specific Gravity, Urine: 1.025 (ref 1.005–1.030)
Urobilinogen, UA: 0.2 mg/dL (ref 0.0–1.0)
pH: 6.5 (ref 5.0–8.0)

## 2021-09-04 LAB — POC URINE PREG, ED: Preg Test, Ur: NEGATIVE

## 2021-09-04 MED ORDER — CEFTRIAXONE SODIUM 1 G IJ SOLR
1.0000 g | Freq: Once | INTRAMUSCULAR | Status: AC
  Administered 2021-09-04: 1 g via INTRAMUSCULAR

## 2021-09-04 MED ORDER — CEFTRIAXONE SODIUM 1 G IJ SOLR
INTRAMUSCULAR | Status: AC
  Filled 2021-09-04: qty 10

## 2021-09-04 MED ORDER — CIPROFLOXACIN HCL 500 MG PO TABS: 500.0000 mg | ORAL_TABLET | Freq: Two times a day (BID) | ORAL | 0 refills | Status: AC

## 2021-09-04 MED ORDER — LIDOCAINE HCL (PF) 1 % IJ SOLN
INTRAMUSCULAR | Status: AC
  Filled 2021-09-04: qty 2

## 2021-09-04 NOTE — Discharge Instructions (Signed)
Take all antibiotics as prescribed, not stop them early just because you feel better. Will only call with urine culture results if it requires a change in therapy Increase hydration with water water water If any new or worsening symptoms, return to the clinic or head to the emergency room. Follow-up with your primary care physician for concerns with menstrual cycle.

## 2021-09-04 NOTE — ED Provider Notes (Signed)
[place]    CSN: [id number] Arrival date & time: 09/04/21  1113      History   Chief Complaint Chief Complaint  Patient presents with   Dysuria    HPI Adriana Jackson is a 20 y.o. adult.   Pleasant 20 year old female presents today for complaints of urinary tract symptoms for the past 1 week.  She reports discomfort and frequency.  She notes over the past 2 to 3 days however she has also had blood in her urine and flank pain bilaterally.  She denies fever.  She has no rash.  She admits that some of the blood may be from her abnormal menstrual period which has been on for the past 10 days.  She reports a history of kidney infections in the past.    Dysuria Pertinent negatives include no chest pain, no abdominal pain and no shortness of breath.   Past Medical History:  Diagnosis Date   Asthma     Patient Active Problem List   Diagnosis Date Noted   Severe episode of recurrent major depressive disorder, with psychotic features ([place])    Alcohol use disorder, moderate, dependence ([place])    Cannabis use disorder, moderate, dependence ([place])    Vitamin D deficiency 09/15/2020    History reviewed. No pertinent surgical history.  OB History   No obstetric history on file.      Home Medications    Prior to Admission medications   Medication Sig Start Date End Date Taking? Authorizing Provider  ciprofloxacin (CIPRO) 500 MG tablet Take 1 tablet (500 mg total) by mouth 2 (two) times daily for 5 days. 09/04/21 09/09/21 Yes Cindi Ghazarian L, PA  lamoTRIgine (LAMICTAL) 150 MG tablet Take 150 mg by mouth daily.   Yes [provider]  albuterol (VENTOLIN HFA) 108 (90 Base) MCG/ACT inhaler Inhale 2 puffs into the lungs every 6 (six) hours as needed for wheezing or shortness of breath. 06/05/21   Kara Dies, NP  hydrOXYzine (ATARAX/VISTARIL) 25 MG tablet Take 1 tablet (25 mg total) by mouth 3 (three) times daily as needed for anxiety. 04/05/21   Sharma Covert, MD  levocetirizine (XYZAL) 5 MG tablet Take 1 tablet (5 mg total) by mouth every evening. 08/12/21   Mar Daring, PA-C  sertraline (ZOLOFT) 50 MG tablet Take 1 tablet (50 mg total) by mouth at bedtime. 04/05/21   Sharma Covert, MD    Family History History reviewed. No pertinent family history.  Social History Social History   Tobacco Use   Smoking status: Never   Smokeless tobacco: Never  Vaping Use   Vaping Use: Never used  Substance Use Topics   Alcohol use: Yes    Alcohol/week: 4.0 standard drinks    Types: 4 Shots of liquor per week    Comment: 4 shots/day   Drug use: Yes    Types: Marijuana    Comment: delta 8     Allergies   Patient has no known allergies.   Review of Systems Review of Systems  Constitutional:  Negative for chills and fever.  HENT:  Negative for ear pain and sore throat.   Eyes:  Negative for pain and visual disturbance.  Respiratory:  Negative for cough and shortness of breath.   Cardiovascular:  Negative for chest pain and palpitations.  Gastrointestinal:  Negative for abdominal pain and vomiting.  Genitourinary:  Positive for dysuria, flank pain, frequency, hematuria and menstrual problem.  Musculoskeletal:  Negative for arthralgias and  back pain.  Skin:  Negative for color change and rash.  Neurological:  Negative for seizures and syncope.  All other systems reviewed and are negative.   Physical Exam Triage Vital Signs ED Triage Vitals  Enc Vitals Group     BP 09/04/21 1348 123/85     Pulse Rate 09/04/21 1348 79     Resp 09/04/21 1348 16     Temp 09/04/21 1348 99.2 F (37.3 C)     Temp Source 09/04/21 1348 Oral     SpO2 09/04/21 1348 100 %     Weight --      Height --      Head Circumference --      Peak Flow --      Pain Score 09/04/21 1349 8     Pain Loc --      Pain Edu? --      Excl. in GC? --    No data found.  Updated Vital Signs BP 123/85 (BP Location: Left Arm)   Pulse 79   Temp 99.2  F (37.3 C) (Oral)   Resp 16   LMP  (LMP Unknown)   SpO2 100%   Visual Acuity Right Eye Distance:   Left Eye Distance:   Bilateral Distance:    Right Eye Near:   Left Eye Near:    Bilateral Near:     Physical Exam Vitals and nursing note reviewed. Exam conducted with a chaperone present.  Constitutional:      General: Adriana Bullinger "[name]" is not in acute distress.    Appearance: Normal appearance. Adriana Ogren "[name]" is well-developed and normal weight. Adriana Buckner "[name]" is not ill-appearing or toxic-appearing.  HENT:     Head: Normocephalic and atraumatic.  Eyes:     Conjunctiva/sclera: Conjunctivae normal.  Cardiovascular:     Rate and Rhythm: Normal rate and regular rhythm.     Heart sounds: No murmur heard. Pulmonary:     Effort: Pulmonary effort is normal. No respiratory distress.     Breath sounds: Normal breath sounds.  Abdominal:     General: Abdomen is flat. Bowel sounds are normal. There is no distension.     Palpations: Abdomen is soft. There is no mass.     Tenderness: There is no abdominal tenderness. There is right CVA tenderness and left CVA tenderness. There is no guarding or rebound.  Musculoskeletal:        General: No swelling.     Cervical back: Neck supple.  Skin:    General: Skin is warm and dry.     Capillary Refill: Capillary refill takes less than 2 seconds.  Neurological:     Mental Status: Adriana Kreamer "[name]" is alert.  Psychiatric:        Mood and Affect: Mood normal.     UC Treatments / Results  Labs (all labs ordered are listed, but only abnormal results are displayed) Labs Reviewed  POCT URINALYSIS DIPSTICK, ED / UC - Abnormal; Notable for the following components:      Result Value   Hgb urine dipstick LARGE (*)    All other components within normal limits  URINE CULTURE  POC URINE PREG, ED    EKG   Radiology No results found.  Procedures Procedures (including critical care time)  Medications Ordered in  UC Medications  cefTRIAXone (ROCEPHIN) injection 1 g (has no administration in time range)    Initial Impression / Assessment and Plan / UC Course  I have reviewed the triage  vital signs and the nursing notes.  Pertinent labs & imaging results that were available during my care of the patient were reviewed by me and considered in my medical decision making (see chart for details).     Suspect pyelonephritis - rocephin in office, PO cipro x 5 days. Increase hydration. Urine culture sent  Abnormal menses - f/u with PCP/ gynecology for further workup Final Clinical Impressions(s) / UC Diagnoses   Final diagnoses:  Acute pyelonephritis     Discharge Instructions      Take all antibiotics as prescribed, not stop them early just because you feel better. Will only call with urine culture results if it requires a change in therapy Increase hydration with water water water If any new or worsening symptoms, return to the clinic or head to the emergency room. Follow-up with your primary care physician for concerns with menstrual cycle.   ED Prescriptions     Medication Sig Dispense Auth. Provider   ciprofloxacin (CIPRO) 500 MG tablet Take 1 tablet (500 mg total) by mouth 2 (two) times daily for 5 days. 10 tablet Fedora Knisely L, [place]      PDMP not reviewed this encounter.   Maretta Bees, [place] 09/04/21 1950

## 2021-09-04 NOTE — ED Triage Notes (Signed)
Pt presents with Dysuria x 2-3 days.

## 2021-09-05 LAB — URINE CULTURE: Culture: NO GROWTH

## 2021-09-09 ENCOUNTER — Emergency Department (HOSPITAL_COMMUNITY)
Admission: EM | Admit: 2021-09-09 | Discharge: 2021-09-09 | Disposition: A | Discharge status: Home / Self Care | Payer: Medicaid Other | Attending: Student | Admitting: Student

## 2021-09-09 ENCOUNTER — Other Ambulatory Visit: Payer: Self-pay

## 2021-09-09 ENCOUNTER — Encounter (HOSPITAL_COMMUNITY): Payer: Self-pay | Admitting: Emergency Medicine

## 2021-09-09 ENCOUNTER — Emergency Department (HOSPITAL_COMMUNITY): Payer: Medicaid Other

## 2021-09-09 DIAGNOSIS — R1031 Right lower quadrant pain: Secondary | ICD-10-CM | POA: Diagnosis not present

## 2021-09-09 DIAGNOSIS — R109 Unspecified abdominal pain: Secondary | ICD-10-CM

## 2021-09-09 DIAGNOSIS — Z20822 Contact with and (suspected) exposure to covid-19: Secondary | ICD-10-CM | POA: Diagnosis not present

## 2021-09-09 DIAGNOSIS — J45909 Unspecified asthma, uncomplicated: Secondary | ICD-10-CM | POA: Insufficient documentation

## 2021-09-09 LAB — CBC
HCT: 40.3 % (ref 36.0–46.0)
Hemoglobin: 13.6 g/dL (ref 12.0–15.0)
MCH: 30.6 pg (ref 26.0–34.0)
MCHC: 33.7 g/dL (ref 30.0–36.0)
MCV: 90.8 fL (ref 80.0–100.0)
Platelets: 304 10*3/uL (ref 150–400)
RBC: 4.44 MIL/uL (ref 3.87–5.11)
RDW: 12.2 % (ref 11.5–15.5)
WBC: 6 10*3/uL (ref 4.0–10.5)
nRBC: 0 % (ref 0.0–0.2)

## 2021-09-09 LAB — RESP PANEL BY RT-PCR (FLU A&B, COVID) ARPGX2
Influenza A by PCR: NEGATIVE
Influenza B by PCR: NEGATIVE
SARS Coronavirus 2 by RT PCR: NEGATIVE

## 2021-09-09 LAB — URINALYSIS, ROUTINE W REFLEX MICROSCOPIC
Bilirubin Urine: NEGATIVE
Glucose, UA: NEGATIVE mg/dL
Ketones, ur: NEGATIVE mg/dL
Leukocytes,Ua: NEGATIVE
Nitrite: NEGATIVE
Protein, ur: NEGATIVE mg/dL
Specific Gravity, Urine: >1.03 — ABNORMAL HIGH (ref 1.005–1.030)
pH: 6.5 (ref 5.0–8.0)

## 2021-09-09 LAB — COMPREHENSIVE METABOLIC PANEL
ALT: 11 U/L (ref 0–44)
AST: 16 U/L (ref 15–41)
Albumin: 4.6 g/dL (ref 3.5–5.0)
Alkaline Phosphatase: 52 U/L (ref 38–126)
Anion gap: 8 (ref 5–15)
BUN: 11 mg/dL (ref 6–20)
CO2: 26 mmol/L (ref 22–32)
Calcium: 9.5 mg/dL (ref 8.9–10.3)
Chloride: 103 mmol/L (ref 98–111)
Creatinine, Ser: 0.81 mg/dL (ref 0.44–1.00)
GFR, Estimated: >60 mL/min (ref >60)
Glucose, Bld: 100 mg/dL — ABNORMAL HIGH (ref 70–99)
Potassium: 3.8 mmol/L (ref 3.5–5.1)
Sodium: 137 mmol/L (ref 135–145)
Total Bilirubin: 0.8 mg/dL (ref 0.3–1.2)
Total Protein: 7.4 g/dL (ref 6.5–8.1)

## 2021-09-09 LAB — I-STAT BETA HCG BLOOD, ED (MC, WL, AP ONLY): I-stat hCG, quantitative: <5 m[IU]/mL (ref <5)

## 2021-09-09 LAB — URINALYSIS, MICROSCOPIC (REFLEX)

## 2021-09-09 LAB — LIPASE, BLOOD: Lipase: 47 U/L (ref 11–51)

## 2021-09-09 MED ORDER — MORPHINE SULFATE (PF) 4 MG/ML IV SOLN
4.0000 mg | Freq: Once | INTRAVENOUS | Status: AC
  Administered 2021-09-09: 4 mg via INTRAVENOUS
  Filled 2021-09-09: qty 1

## 2021-09-09 MED ORDER — ONDANSETRON HCL 4 MG/2ML IJ SOLN
4.0000 mg | Freq: Once | INTRAMUSCULAR | Status: AC
  Administered 2021-09-09: 4 mg via INTRAVENOUS
  Filled 2021-09-09: qty 2

## 2021-09-09 MED ORDER — LACTATED RINGERS IV BOLUS
1000.0000 mL | Freq: Once | INTRAVENOUS | Status: AC
  Administered 2021-09-09: 1000 mL via INTRAVENOUS

## 2021-09-09 MED ORDER — DIPHENHYDRAMINE HCL 50 MG/ML IJ SOLN
25.0000 mg | Freq: Once | INTRAMUSCULAR | Status: AC
  Administered 2021-09-09: 25 mg via INTRAVENOUS
  Filled 2021-09-09: qty 1

## 2021-09-09 MED ORDER — DROPERIDOL 2.5 MG/ML IJ SOLN
1.2500 mg | Freq: Once | INTRAMUSCULAR | Status: AC
  Administered 2021-09-09: 1.25 mg via INTRAVENOUS
  Filled 2021-09-09: qty 2

## 2021-09-09 MED ORDER — KETOROLAC TROMETHAMINE 15 MG/ML IJ SOLN
15.0000 mg | Freq: Once | INTRAMUSCULAR | Status: AC
  Administered 2021-09-09: 15 mg via INTRAVENOUS
  Filled 2021-09-09: qty 1

## 2021-09-09 MED ORDER — IOHEXOL 300 MG/ML  SOLN
75.0000 mL | Freq: Once | INTRAMUSCULAR | Status: AC | PRN
  Administered 2021-09-09: 75 mL via INTRAVENOUS

## 2021-09-09 NOTE — ED Triage Notes (Signed)
Patient here with complaint of right flank pain that started approximately two weeks ago, was seen at urgent care and told that if pain continues to go to [facility]. Patient states pain is exacerbated by moving in certain positions.Denies hematuria. Patient alert, oriented, ambulatory, and in no apparent distress at this time.

## 2021-09-09 NOTE — ED Provider Notes (Signed)
[facility] EMERGENCY DEPARTMENT Provider Note   CSN: [id number] Arrival date & time: 09/09/21  1607     History Chief Complaint  Patient presents with   Flank Pain    Adriana Jackson is a 20 y.o. adult with PMH major depression, previous alcohol and cannabis use disorder who presents emergency department for evaluation of abdominal pain and flank pain.  Patient states that for the last 2 weeks she has had intermittent right lower quadrant pain that acutely worsened overnight.  They state the pain is primarily in the left flank and radiates around into the right lower quadrant.  Patient was seen in urgent care previously with negative urinalysis, but was sent home on ciprofloxacin and due to negative culture results she was told to discontinue this medication.  They deny current chest pain, dysuria, increased frequency, vaginal bleeding, vaginal discharge, headache, fever or other systemic symptoms.   Flank Pain Associated symptoms include abdominal pain. Pertinent negatives include no chest pain and no shortness of breath.      Past Medical History:  Diagnosis Date   Asthma     Patient Active Problem List   Diagnosis Date Noted   Severe episode of recurrent major depressive disorder, with psychotic features ([facility])    Alcohol use disorder, moderate, dependence ([facility])    Cannabis use disorder, moderate, dependence ([facility])    Vitamin D deficiency 09/15/2020    No past surgical history on file.   OB History   No obstetric history on file.     No family history on file.  Social History   Tobacco Use   Smoking status: Never   Smokeless tobacco: Never  Vaping Use   Vaping Use: Never used  Substance Use Topics   Alcohol use: Yes    Alcohol/week: 4.0 standard drinks    Types: 4 Shots of liquor per week    Comment: 4 shots/day   Drug use: Yes    Types: Marijuana    Comment: delta 8    Home Medications Prior to Admission medications   Medication Sig  Start Date End Date Taking? Authorizing Provider  albuterol (VENTOLIN HFA) 108 (90 Base) MCG/ACT inhaler Inhale 2 puffs into the lungs every 6 (six) hours as needed for wheezing or shortness of breath. 06/05/21  Yes Benay Pike, NP  Glucosamine HCl (GLUCOSAMINE PO) Take 1 tablet by mouth daily.   Yes [provider]  hydrOXYzine (ATARAX/VISTARIL) 25 MG tablet Take 1 tablet (25 mg total) by mouth 3 (three) times daily as needed for anxiety. 04/05/21  Yes Antonieta Pert, MD  lamoTRIgine (LAMICTAL) 150 MG tablet Take 150 mg by mouth daily.   Yes [provider]  levocetirizine (XYZAL) 5 MG tablet Take 1 tablet (5 mg total) by mouth every evening. 08/12/21  Yes Margaretann Loveless, PA-C  sertraline (ZOLOFT) 50 MG tablet Take 1 tablet (50 mg total) by mouth at bedtime. Patient taking differently: Take 150 mg by mouth daily. 04/05/21  Yes Antonieta Pert, MD  VITAMIN D PO Take 1 tablet by mouth daily.   Yes [provider]  ciprofloxacin (CIPRO) 500 MG tablet Take 1 tablet (500 mg total) by mouth 2 (two) times daily for 5 days. Patient not taking: Reported on 09/09/2021 09/04/21 09/09/21  Maretta Bees, PA    Allergies    Patient has no known allergies.  Review of Systems   Review of Systems  Constitutional:  Negative for chills and fever.  HENT:  Negative  for ear pain and sore throat.   Eyes:  Negative for pain and visual disturbance.  Respiratory:  Negative for cough and shortness of breath.   Cardiovascular:  Negative for chest pain and palpitations.  Gastrointestinal:  Positive for abdominal pain. Negative for vomiting.  Genitourinary:  Positive for flank pain. Negative for dysuria and hematuria.  Musculoskeletal:  Negative for arthralgias and back pain.  Skin:  Negative for color change and rash.  Neurological:  Negative for seizures and syncope.  All other systems reviewed and are negative.  Physical Exam Updated Vital Signs BP 107/73 (BP  Location: Right Arm)    Pulse 71    Temp 98.5 F (36.9 C) (Oral)    Resp 16    Ht 5\' 8"  (1.727 m)    Wt 50.8 kg    LMP 08/26/2021 (Exact Date)    SpO2 99%    BMI 17.03 kg/m   Physical Exam Constitutional:      General: Adriana Bracken "[name]" is not in acute distress.    Appearance: Normal appearance.  HENT:     Head: Normocephalic and atraumatic.     Nose: No congestion or rhinorrhea.  Eyes:     General:        Right eye: No discharge.        Left eye: No discharge.     Extraocular Movements: Extraocular movements intact.     Pupils: Pupils are equal, round, and reactive to light.  Cardiovascular:     Rate and Rhythm: Normal rate and regular rhythm.     Heart sounds: No murmur heard. Pulmonary:     Effort: No respiratory distress.     Breath sounds: No wheezing or rales.  Abdominal:     General: There is no distension.     Tenderness: There is abdominal tenderness. There is right CVA tenderness.  Musculoskeletal:        General: Normal range of motion.     Cervical back: Normal range of motion.  Skin:    General: Skin is warm and dry.  Neurological:     General: No focal deficit present.     Mental Status: Adriana Bellot "[name]" is alert.    ED Results / Procedures / Treatments   Labs (all labs ordered are listed, but only abnormal results are displayed) Labs Reviewed  COMPREHENSIVE METABOLIC PANEL - Abnormal; Notable for the following components:      Result Value   Glucose, Bld 100 (*)    All other components within normal limits  URINALYSIS, ROUTINE W REFLEX MICROSCOPIC - Abnormal; Notable for the following components:   Specific Gravity, Urine >1.030 (*)    Hgb urine dipstick LARGE (*)    All other components within normal limits  URINALYSIS, MICROSCOPIC (REFLEX) - Abnormal; Notable for the following components:   Bacteria, UA RARE (*)    All other components within normal limits  RESP PANEL BY RT-PCR (FLU A&B, COVID) ARPGX2  LIPASE, BLOOD  CBC  I-STAT BETA HCG  BLOOD, ED ([facility], [facility], [facility] ONLY)    EKG None  Radiology CT ABDOMEN PELVIS W CONTRAST  Result Date: 09/09/2021 CLINICAL DATA:  Right lower quadrant pain EXAM: CT ABDOMEN AND PELVIS WITH CONTRAST TECHNIQUE: Multidetector CT imaging of the abdomen and pelvis was performed using the standard protocol following bolus administration of intravenous contrast. CONTRAST:  13mL OMNIPAQUE IOHEXOL 300 MG/ML  SOLN COMPARISON:  CT renal stone dated August 29, 2020 FINDINGS: Lower chest: No acute abnormality. Hepatobiliary: No focal liver  abnormality is seen. No gallstones, gallbladder wall thickening, or biliary dilatation. Pancreas: Unremarkable. No pancreatic ductal dilatation or surrounding inflammatory changes. Spleen: Normal in size without focal abnormality. Adrenals/Urinary Tract: Adrenal glands are unremarkable. Kidneys are normal, without renal calculi, focal lesion, or hydronephrosis. Bladder is unremarkable. Stomach/Bowel: Stomach is within normal limits. Appendix appears normal. No evidence of bowel wall thickening, distention, or inflammatory changes. Vascular/Lymphatic: No significant vascular findings are present. No enlarged abdominal or pelvic lymph nodes. Reproductive: Uterus and bilateral adnexa are unremarkable. Other: No abdominal wall hernia or abnormality. No abdominopelvic ascites. Musculoskeletal: No acute or significant osseous findings. IMPRESSION: No acute findings in the abdomen or pelvis. Normal appearing appendix. Electronically Signed   By: Yetta Glassman M.D.   On: 09/09/2021 12:04    Procedures Procedures   Medications Ordered in ED Medications  morphine 4 MG/ML injection 4 mg (4 mg Intravenous Given 09/09/21 1137)  ondansetron (ZOFRAN) injection 4 mg (4 mg Intravenous Given 09/09/21 1137)  lactated ringers bolus 1,000 mL (0 mLs Intravenous Stopped 09/09/21 1355)  iohexol (OMNIPAQUE) 300 MG/ML solution 75 mL (75 mLs Intravenous Contrast Given 09/09/21 1153)  droperidol  (INAPSINE) 2.5 MG/ML injection 1.25 mg (1.25 mg Intravenous Given 09/09/21 1242)  diphenhydrAMINE (BENADRYL) injection 25 mg (25 mg Intravenous Given 09/09/21 1245)  ketorolac (TORADOL) 15 MG/ML injection 15 mg (15 mg Intravenous Given 09/09/21 1242)    ED Course  I have reviewed the triage vital signs and the nursing notes.  Pertinent labs & imaging results that were available during my care of the patient were reviewed by me and considered in my medical decision making (see chart for details).    MDM Rules/Calculators/A&P                          Patient seen emergency department for evaluation of flank and abdominal pain.  Physical exam reveals right CVA tenderness and mild right lower quadrant tenderness.  Laboratory evaluation unremarkable.  Pregnancy negative, urinalysis with 6-10 red blood cells.  Patient is currently menstruating.  CT abdomen pelvis with no evidence of appendicitis or any acute intra-abdominal pathology.  Patient given droperidol and Benadryl leading to complete resolution of her symptoms.  Patient able to tolerate p.o. without difficulty on reevaluation and she states that her symptoms have improved.  Patient then discharged with instructions to follow-up with primary care and resources for GI were provided if her symptoms were to be persistent.  Patient states that she currently does not smoke marijuana every day, but I explained that daily marijuana smoking can cause the symptoms and she should avoid this in the future.   Final Clinical Impression(s) / ED Diagnoses Final diagnoses:  Flank pain  Right lower quadrant abdominal pain    Rx / DC Orders ED Discharge Orders     None        Kennet Mccort, Debe Coder, MD 09/09/21 [id number]

## 2021-09-09 NOTE — ED Notes (Signed)
Patient transported to CT 

## 2021-10-07 ENCOUNTER — Telehealth: Payer: Medicaid Other | Admitting: Physician Assistant

## 2021-10-07 DIAGNOSIS — K644 Residual hemorrhoidal skin tags: Secondary | ICD-10-CM

## 2021-10-07 MED ORDER — HYDROCORTISONE ACETATE 25 MG RE SUPP: 25.0000 mg | Freq: Two times a day (BID) | RECTAL | 0 refills | Status: DC

## 2021-10-07 NOTE — Progress Notes (Signed)

## 2021-10-23 ENCOUNTER — Encounter: Payer: Self-pay | Admitting: Emergency Medicine

## 2021-10-23 ENCOUNTER — Telehealth: Payer: Medicaid Other | Admitting: Emergency Medicine

## 2021-10-23 DIAGNOSIS — L708 Other acne: Secondary | ICD-10-CM | POA: Diagnosis not present

## 2021-10-23 MED ORDER — CLINDAMYCIN PHOS-BENZOYL PEROX 1-5 % EX GEL: Freq: Two times a day (BID) | CUTANEOUS | 1 refills | Status: DC

## 2021-10-23 NOTE — Progress Notes (Signed)
We are sorry that you are experiencing this issue.  Here is how we plan to help!  Based on what you shared with me it looks like you have uncomplicated acne.  Acne is a disorder of the hair follicles and oil glands (sebaceous glands). The sebaceous glands secrete oils to keep the skin moist.  When the glands get clogged, it can lead to pimples or cysts.  These cysts may become infected and leave scars. Acne is very common and normally occurs at puberty.  Acne is also inherited.  Your personal care plan consists of the following recommendations:  I recommend that you use a daily cleanser  You might try 2% topical salicylic acid pads or wipes.  Use the pads to daily cleanse your skin.  I have prescribed a topical gel with an antibiotic:  Clindamycin-benzoyl peroxide gel.  This gel should be applied to the affected areas twice a day.  Be sure to read the package insert to understand potential side effects.   If excessive dryness or peeling occurs, reduce dose frequency or concentration of the topical scrubs.  If excessive stinging or burning occurs, remove the topical gel with mild soap and water and resume at a lower dose the next day.  Remember oral antibiotics and topical acne treatments may increase your sensitivity to the sun!  HOME CARE: Do not squeeze pimples because that can often lead to infections, worse acne, and scars. Use a moisturizer that contains retinoid or fruit acids that may inhibit the development of new acne lesions. Although there is not a clear link that foods can cause acne, doctors do believe that too many sweets predispose you to skin problems.  GET HELP RIGHT AWAY IF: If your acne gets worse or is not better within 10 days. If you become depressed. If you become pregnant, discontinue medications and call your OB/GYN.  MAKE SURE YOU: Understand these instructions. Will watch your condition. Will get help right away if you are not doing well or get worse.  Thank  you for choosing an e-visit.  Your e-visit answers were reviewed by a board certified advanced clinical practitioner to complete your personal care plan. Depending upon the condition, your plan could have included both over the counter or prescription medications.  Please review your pharmacy choice. Make sure the pharmacy is open so you can pick up prescription now. If there is a problem, you may contact your provider through MyChart messaging and have the prescription routed to another pharmacy.  Your safety is important to us. If you have drug allergies check your prescription carefully.   For the next 24 hours you can use MyChart to ask questions about today's visit, request a non-urgent call back, or ask for a work or school excuse. You will get an email in the next two days asking about your experience. I hope that your e-visit has been valuable and will speed your recovery.  

## 2021-10-23 NOTE — Progress Notes (Signed)
I have spent 5 minutes in review of e-visit questionnaire, review and updating patient chart, medical decision making and response to patient.   Riaz Onorato, PA-C    

## 2021-11-08 ENCOUNTER — Telehealth: Payer: Medicaid Other | Admitting: Physician Assistant

## 2021-11-08 DIAGNOSIS — M545 Low back pain, unspecified: Secondary | ICD-10-CM

## 2021-11-08 MED ORDER — NAPROXEN 500 MG PO TABS: 500.0000 mg | ORAL_TABLET | Freq: Two times a day (BID) | ORAL | 0 refills | Status: AC

## 2021-11-08 MED ORDER — CYCLOBENZAPRINE HCL 5 MG PO TABS: 5.0000 mg | ORAL_TABLET | Freq: Three times a day (TID) | ORAL | 1 refills | Status: DC | PRN

## 2021-11-08 NOTE — Progress Notes (Signed)

## 2021-11-14 IMAGING — CT CT RENAL STONE PROTOCOL
2 of 4 series · 16 of 46 positions shown, 18 images · non-contrast
Comparison: None.

CLINICAL DATA: Left-sided flank pain for 1 week

EXAM:
CT ABDOMEN AND PELVIS WITHOUT CONTRAST
TECHNIQUE: Multidetector CT imaging of the abdomen and pelvis was performed
following the standard protocol without IV contrast.

[Series 2: axial st · axial · 0.59mm/px · z∈[-796,-432]mm · 13 of 83 slices shown, 15 images]
[im 5/83  soft-tissue]
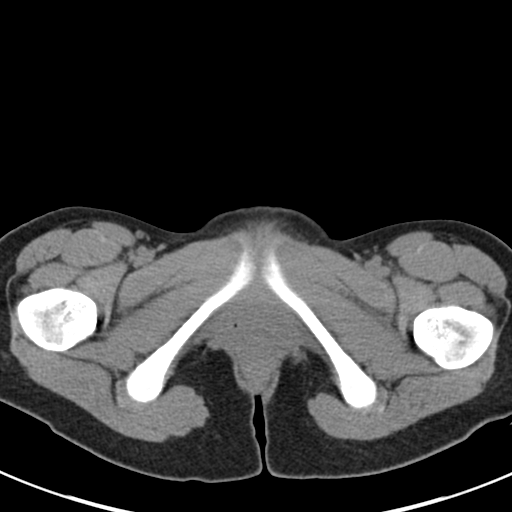
[im 5/83  bone]
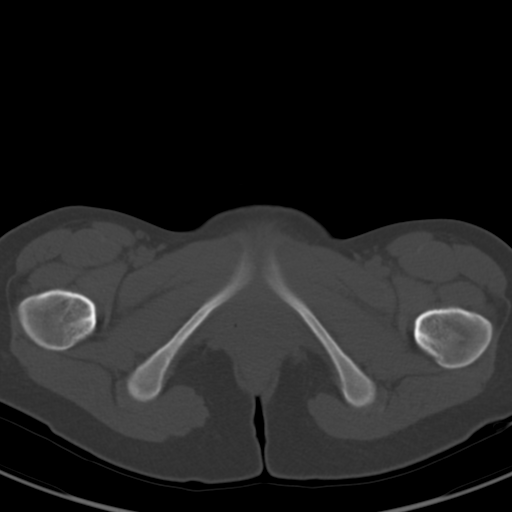
[im 10/83  soft-tissue]
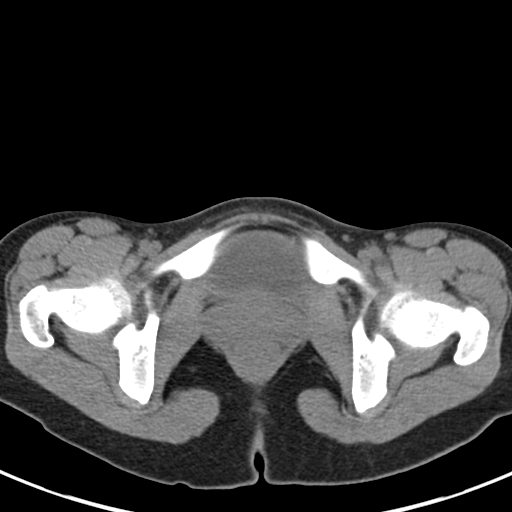
[im 20/83  soft-tissue]
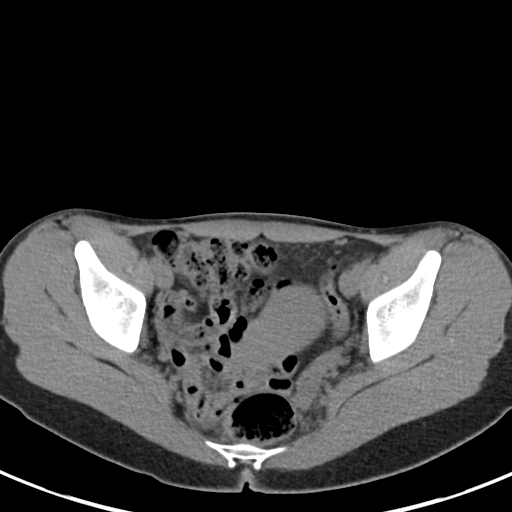
[im 25/83  soft-tissue]
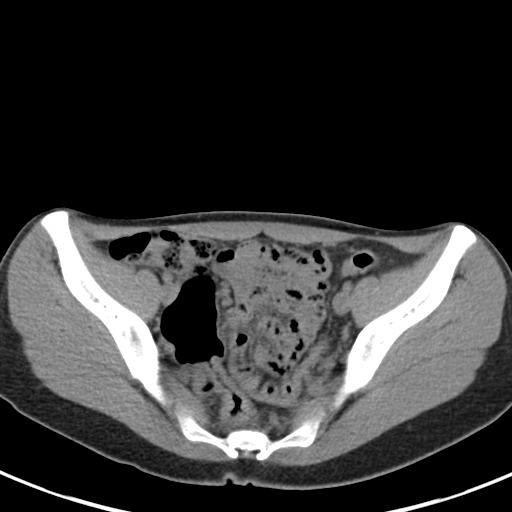
[im 29/83  soft-tissue]
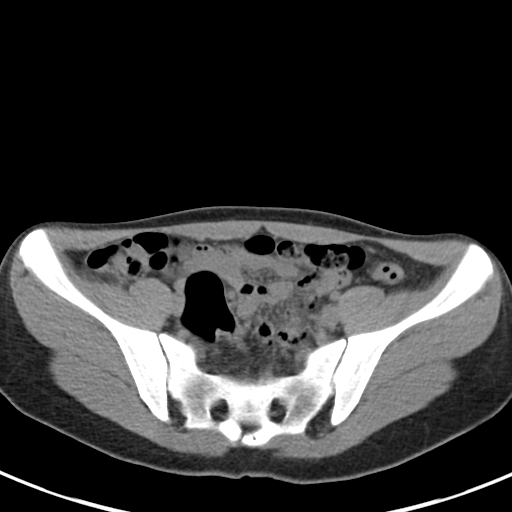
[im 34/83  soft-tissue]
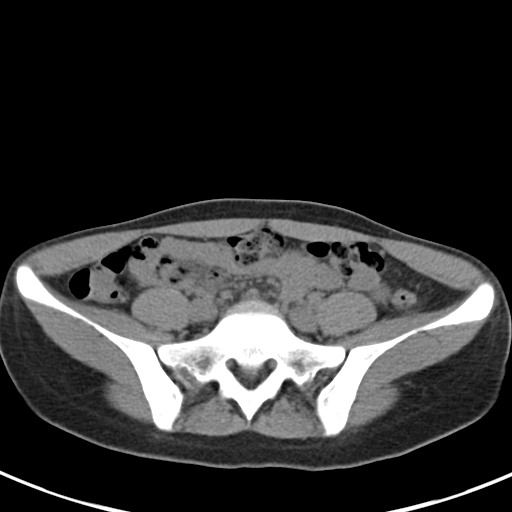
[im 44/83  soft-tissue]
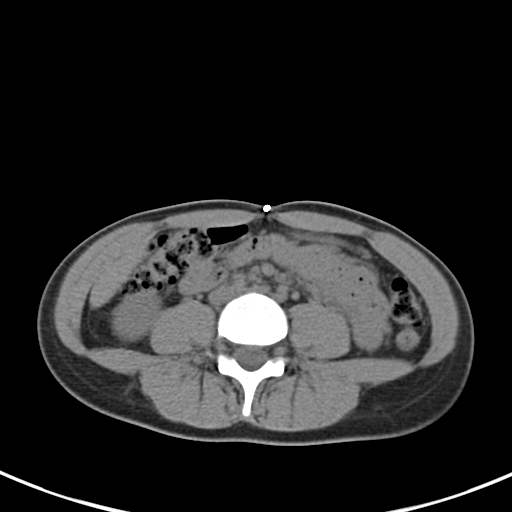
[im 49/83  soft-tissue]
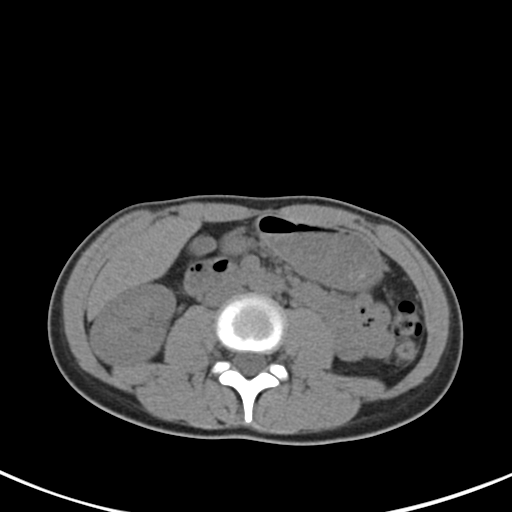
[im 54/83  soft-tissue]
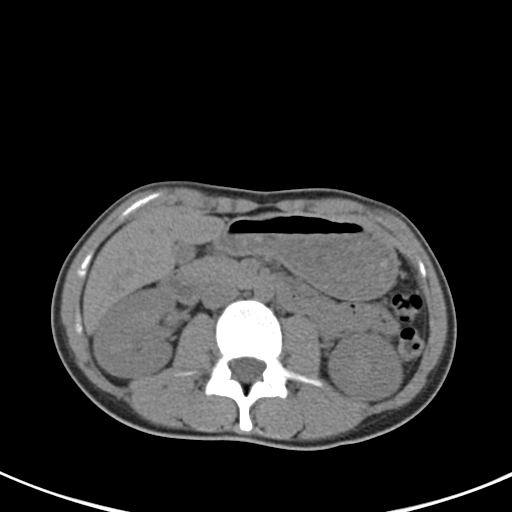
[im 54/83  bone]
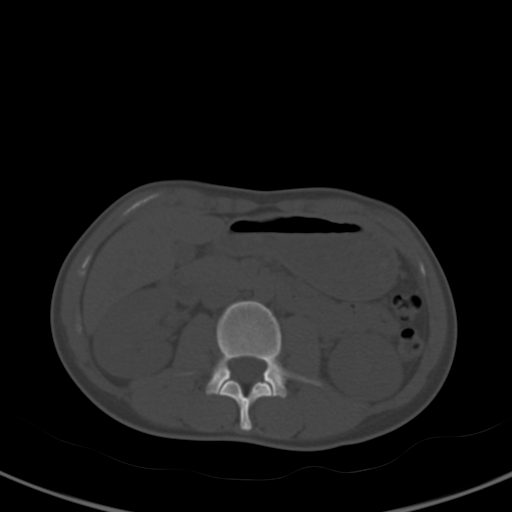
[im 58/83  soft-tissue]
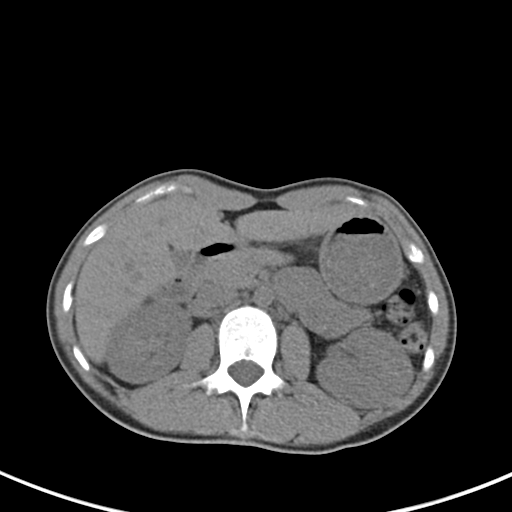
[im 63/83  soft-tissue]
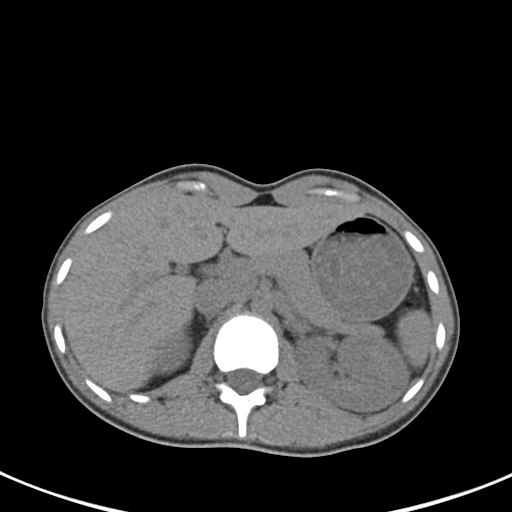
[im 73/83  soft-tissue]
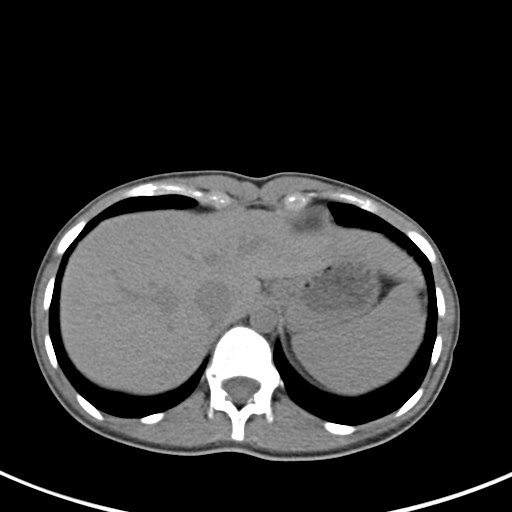
[im 78/83  soft-tissue]
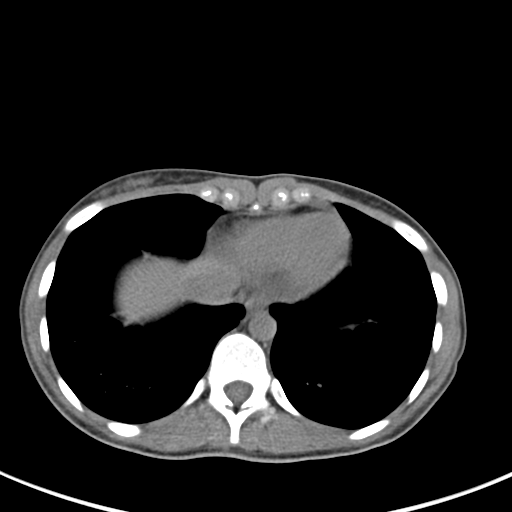

[Series 5: coronal · coronal · 0.63mm/px · 3 of 96 slices shown]
[im 32/96  soft-tissue]
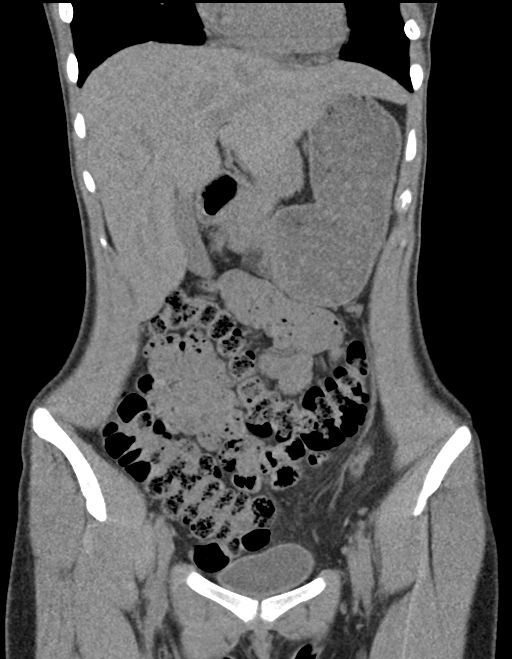
[im 43/96  soft-tissue]
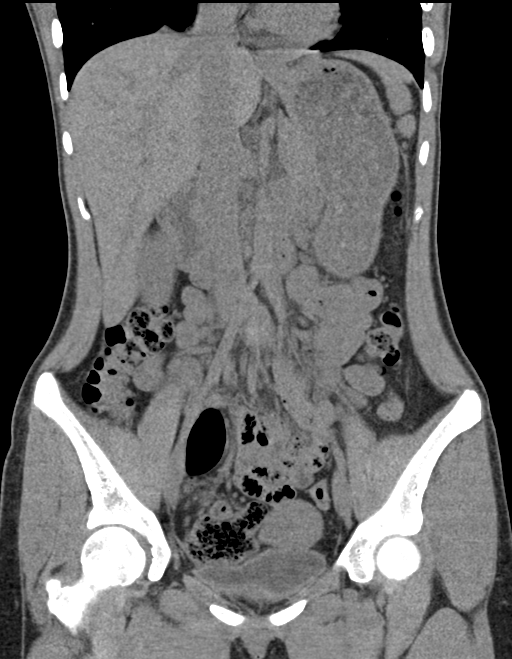
[im 53/96  soft-tissue]
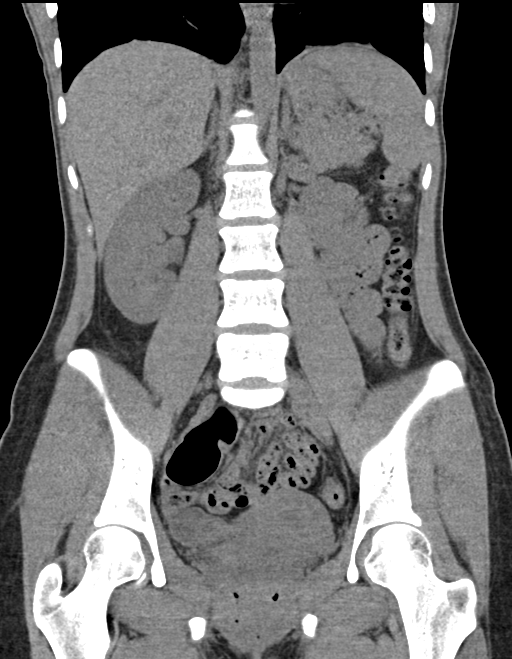

[16 of 46 positions shown; findings below may reference images not displayed]

FINDINGS: Lower chest: No acute abnormality.

Hepatobiliary: No focal liver abnormality is seen. No gallstones,
gallbladder wall thickening, or biliary dilatation.

Pancreas: Unremarkable. No pancreatic ductal dilatation or
surrounding inflammatory changes.

Spleen: Normal in size without focal abnormality.

Adrenals/Urinary Tract: Adrenal glands are within normal limits.
Kidneys show no renal calculi or obstructive changes. Bladder is
partially distended.

Stomach/Bowel: The appendix is within normal limits. No obstructive
or inflammatory changes of the large or small bowel are seen.
Stomach is within normal limits.

Vascular/Lymphatic: No significant vascular findings are present. No
enlarged abdominal or pelvic lymph nodes.

Reproductive: Uterus and bilateral adnexa are unremarkable.

Other: No abdominal wall hernia or abnormality. No abdominopelvic
ascites.

Musculoskeletal: No acute or significant osseous findings.
IMPRESSION: No acute abnormality noted.

## 2021-11-15 ENCOUNTER — Telehealth: Payer: Medicaid Other | Admitting: Nurse Practitioner

## 2021-11-15 DIAGNOSIS — K644 Residual hemorrhoidal skin tags: Secondary | ICD-10-CM | POA: Diagnosis not present

## 2021-11-15 MED ORDER — HYDROCORTISONE ACETATE 25 MG RE SUPP: 25.0000 mg | Freq: Two times a day (BID) | RECTAL | 0 refills | Status: DC

## 2021-11-15 NOTE — Progress Notes (Signed)

## 2021-11-17 ENCOUNTER — Encounter (HOSPITAL_COMMUNITY): Payer: Self-pay | Admitting: Emergency Medicine

## 2021-11-17 ENCOUNTER — Other Ambulatory Visit: Payer: Self-pay

## 2021-11-17 ENCOUNTER — Emergency Department (HOSPITAL_COMMUNITY)
Admission: EM | Admit: 2021-11-17 | Discharge: 2021-11-17 | Disposition: A | Discharge status: Home / Self Care | Payer: Medicaid Other | Attending: Emergency Medicine | Admitting: Emergency Medicine

## 2021-11-17 DIAGNOSIS — R630 Anorexia: Secondary | ICD-10-CM | POA: Insufficient documentation

## 2021-11-17 DIAGNOSIS — R1084 Generalized abdominal pain: Secondary | ICD-10-CM | POA: Diagnosis present

## 2021-11-17 DIAGNOSIS — K625 Hemorrhage of anus and rectum: Secondary | ICD-10-CM | POA: Diagnosis not present

## 2021-11-17 LAB — COMPREHENSIVE METABOLIC PANEL
ALT: 12 U/L (ref 0–44)
AST: 21 U/L (ref 15–41)
Albumin: 4.7 g/dL (ref 3.5–5.0)
Alkaline Phosphatase: 52 U/L (ref 38–126)
Anion gap: 6 (ref 5–15)
BUN: 14 mg/dL (ref 6–20)
CO2: 26 mmol/L (ref 22–32)
Calcium: 9.4 mg/dL (ref 8.9–10.3)
Chloride: 103 mmol/L (ref 98–111)
Creatinine, Ser: 0.74 mg/dL (ref 0.44–1.00)
GFR, Estimated: >60 mL/min (ref >60)
Glucose, Bld: 89 mg/dL (ref 70–99)
Potassium: 4.4 mmol/L (ref 3.5–5.1)
Sodium: 135 mmol/L (ref 135–145)
Total Bilirubin: 1 mg/dL (ref 0.3–1.2)
Total Protein: 8 g/dL (ref 6.5–8.1)

## 2021-11-17 LAB — URINALYSIS, ROUTINE W REFLEX MICROSCOPIC
Bilirubin Urine: NEGATIVE
Glucose, UA: NEGATIVE mg/dL
Hgb urine dipstick: NEGATIVE
Ketones, ur: NEGATIVE mg/dL
Nitrite: NEGATIVE
Protein, ur: 30 mg/dL — AB
Specific Gravity, Urine: 1.017 (ref 1.005–1.030)
pH: 8 (ref 5.0–8.0)

## 2021-11-17 LAB — CBC WITH DIFFERENTIAL/PLATELET
Abs Immature Granulocytes: 0.01 10*3/uL (ref 0.00–0.07)
Basophils Absolute: 0.1 10*3/uL (ref 0.0–0.1)
Basophils Relative: 1 %
Eosinophils Absolute: 0.1 10*3/uL (ref 0.0–0.5)
Eosinophils Relative: 2 %
HCT: 38.9 % (ref 36.0–46.0)
Hemoglobin: 13.1 g/dL (ref 12.0–15.0)
Immature Granulocytes: 0 %
Lymphocytes Relative: 27 %
Lymphs Abs: 1.8 10*3/uL (ref 0.7–4.0)
MCH: 30.7 pg (ref 26.0–34.0)
MCHC: 33.7 g/dL (ref 30.0–36.0)
MCV: 91.1 fL (ref 80.0–100.0)
Monocytes Absolute: 0.8 10*3/uL (ref 0.1–1.0)
Monocytes Relative: 12 %
Neutro Abs: 3.8 10*3/uL (ref 1.7–7.7)
Neutrophils Relative %: 58 %
Platelets: 284 10*3/uL (ref 150–400)
RBC: 4.27 MIL/uL (ref 3.87–5.11)
RDW: 12 % (ref 11.5–15.5)
WBC: 6.5 10*3/uL (ref 4.0–10.5)
nRBC: 0 % (ref 0.0–0.2)

## 2021-11-17 LAB — LIPASE, BLOOD: Lipase: 27 U/L (ref 11–51)

## 2021-11-17 LAB — I-STAT BETA HCG BLOOD, ED (MC, WL, AP ONLY): I-stat hCG, quantitative: <5 m[IU]/mL (ref <5)

## 2021-11-17 MED ORDER — DICYCLOMINE HCL 10 MG PO CAPS
10.0000 mg | ORAL_CAPSULE | Freq: Once | ORAL | Status: AC
  Administered 2021-11-17: 10 mg via ORAL
  Filled 2021-11-17: qty 1

## 2021-11-17 MED ORDER — DICYCLOMINE HCL 20 MG PO TABS: 20.0000 mg | ORAL_TABLET | Freq: Two times a day (BID) | ORAL | 0 refills | Status: DC

## 2021-11-17 NOTE — Discharge Instructions (Signed)
We discussed the results of your labs on today's visit.  We discussed the importance of following up with gastroenterology in order to obtain further evaluation of your recurrent abdominal pain.  I have prescribed a short course of Bentyl to help with some of the cramping, please take this medication as prescribed.

## 2021-11-17 NOTE — ED Provider Notes (Signed)
[facility]-EMERGENCY DEPT Provider Note   CSN: [id number] Arrival date & time: 11/17/21  5790     History  Chief Complaint  Patient presents with   Abdominal Pain   Rectal Bleeding    Adriana Jackson is a 21 y.o. adult.  21 y.o female with no PMH presents to the ED with a chief complain of abdominal pain that is been ongoing for several months.  According to patient she has an ongoing history of GI issues, was seen in the ED last time and referred to a specialist however due to being busy with school has been unable to make an appointment.  She continues to have generalized abdominal cramping, some anorexia, began to have bloody stools.  Was seen for an ED visit diagnosed with internal hemorrhoids, reports the suppositories have not been helping her symptoms.  She continues to have bloody stools, is increasing her fiber in her diet.  Last bowel movement was yesterday.  Last menstrual period in the month of February 6.  No prior history of GI cancer, no fevers, no nausea, no vomiting.     The history is provided by the patient and medical records.  Abdominal Pain Pain location:  Generalized Pain quality: cramping   Pain radiates to:  Does not radiate Pain severity:  Moderate Onset quality:  Gradual Duration:  20 weeks Timing:  Constant Progression:  Worsening Context: not alcohol use, not diet changes, not laxative use, not recent sexual activity, not sick contacts and not suspicious food intake   Relieved by:  NSAIDs Worsened by:  Nothing Ineffective treatments:  None tried Associated symptoms: anorexia, hematochezia and nausea   Associated symptoms: no chest pain, no cough, no diarrhea, no fever and no vomiting   Risk factors: NSAID use   Risk factors: no alcohol abuse   Rectal Bleeding Associated symptoms: abdominal pain   Associated symptoms: no fever and no vomiting       Home Medications Prior to Admission medications   Medication Sig Start Date  End Date Taking? Authorizing Provider  dicyclomine (BENTYL) 20 MG tablet Take 1 tablet (20 mg total) by mouth 2 (two) times daily for 7 days. 11/17/21 11/24/21 Yes Rosalee Tolley, Leonie Douglas, PA-C  albuterol (VENTOLIN HFA) 108 (90 Base) MCG/ACT inhaler Inhale 2 puffs into the lungs every 6 (six) hours as needed for wheezing or shortness of breath. 06/05/21   Leath-Warren, Sadie Haber, NP  clindamycin-benzoyl peroxide (BENZACLIN) gel Apply topically 2 (two) times daily. 10/23/21   Wurst, [place], PA-C  cyclobenzaprine (FLEXERIL) 5 MG tablet Take 1 tablet (5 mg total) by mouth 3 (three) times daily as needed for muscle spasms. 11/08/21   Couture, Cortni S, PA-C  Glucosamine HCl (GLUCOSAMINE PO) Take 1 tablet by mouth daily.    [provider]  hydrocortisone (ANUSOL-HC) 25 MG suppository Place 1 suppository (25 mg total) rectally 2 (two) times daily. 10/07/21   Margaretann Loveless, PA-C  hydrocortisone (ANUSOL-HC) 25 MG suppository Place 1 suppository (25 mg total) rectally 2 (two) times daily. 11/15/21   Daphine Deutscher Mary-Margaret, FNP  hydrOXYzine (ATARAX/VISTARIL) 25 MG tablet Take 1 tablet (25 mg total) by mouth 3 (three) times daily as needed for anxiety. 04/05/21   Antonieta Pert, MD  lamoTRIgine (LAMICTAL) 150 MG tablet Take 150 mg by mouth daily.    [provider]  levocetirizine (XYZAL) 5 MG tablet Take 1 tablet (5 mg total) by mouth every evening. 08/12/21   Margaretann Loveless, PA-C  sertraline (ZOLOFT) 50 MG  tablet Take 1 tablet (50 mg total) by mouth at bedtime. Patient taking differently: Take 150 mg by mouth daily. 04/05/21   Antonieta Pert, MD  VITAMIN D PO Take 1 tablet by mouth daily.    [provider]      Allergies    Patient has no known allergies.    Review of Systems   Review of Systems  Constitutional:  Negative for fever.  Respiratory:  Negative for cough.   Cardiovascular:  Negative for chest pain.  Gastrointestinal:  Positive for abdominal pain, anorexia,  blood in stool, hematochezia and nausea. Negative for diarrhea and vomiting.  Genitourinary:  Negative for flank pain.  Musculoskeletal:  Negative for back pain.  All other systems reviewed and are negative.  Physical Exam Updated Vital Signs BP 113/80    Pulse 92    Temp 98.2 F (36.8 C) (Oral)    Resp 18    Ht 5\' 8"  (1.727 m)    Wt 49.9 kg    SpO2 100%    BMI 16.73 kg/m  Physical Exam Vitals and nursing note reviewed.  Constitutional:      General: Adriana Fontenette "[name]" is not in acute distress.    Appearance: Adriana Dinkel "[name]" is well-developed.  HENT:     Head: Normocephalic and atraumatic.     Mouth/Throat:     Pharynx: No oropharyngeal exudate.  Eyes:     Pupils: Pupils are equal, round, and reactive to light.  Cardiovascular:     Rate and Rhythm: Regular rhythm.     Heart sounds: Normal heart sounds.  Pulmonary:     Effort: Pulmonary effort is normal. No respiratory distress.     Breath sounds: Normal breath sounds.  Abdominal:     General: Bowel sounds are normal. There is no distension.     Palpations: Abdomen is soft.     Tenderness: There is no abdominal tenderness.  Musculoskeletal:        General: No tenderness or deformity.     Cervical back: Normal range of motion.     Right lower leg: No edema.     Left lower leg: No edema.  Skin:    General: Skin is warm and dry.  Neurological:     Mental Status: Adriana Kok "[name]" is alert and oriented to person, place, and time.    ED Results / Procedures / Treatments   Labs (all labs ordered are listed, but only abnormal results are displayed) Labs Reviewed  URINALYSIS, ROUTINE W REFLEX MICROSCOPIC - Abnormal; Notable for the following components:      Result Value   APPearance CLOUDY (*)    Protein, ur 30 (*)    Leukocytes,Ua LARGE (*)    Bacteria, UA FEW (*)    All other components within normal limits  CBC WITH DIFFERENTIAL/PLATELET  COMPREHENSIVE METABOLIC PANEL  LIPASE, BLOOD  I-STAT BETA HCG BLOOD, ED  ([facility], [facility], [facility] ONLY)  POC OCCULT BLOOD, ED    EKG None  Radiology No results found.  Procedures Procedures    Medications Ordered in ED Medications  dicyclomine (BENTYL) capsule 10 mg (10 mg Oral Given 11/17/21 1023)    ED Course/ Medical Decision Making/ A&P Clinical Course as of 11/17/21 1220  Thu Nov 17, 2021  1159 Bacteria, UA(!): FEW [JS]  1159 Leukocytes,Ua(!): LARGE [JS]    Clinical Course User Index [JS] Claude Manges, PA-C  Medical Decision Making Amount and/or Complexity of Data Reviewed Labs: ordered. Decision-making details documented in ED Course.  Risk Prescription drug management.   This patient presents to the ED for concern of abdominal pain, this involves a number of treatment options, and is a complaint that carries with it a low risk of complications and morbidity.  The differential diagnosis includes malignancy, appendicitis, IBS    Co morbidities: Discussed in HPI   Brief History:  Patient presents to the ED with complaints of recurrent abdominal pain, has been evaluated for abdominal pain in the past.  No relief with over-the-counter medication, did not see specialist due to constraints in her schedule.  EMR reviewed including pt PMHx, past surgical history and past visits to ER.   See HPI for more details   Lab Tests:  I ordered and independently interpreted labs.  The pertinent results include:    I personally reviewed all laboratory work and imaging. Metabolic panel without any acute abnormality specifically kidney function within normal limits and no significant electrolyte abnormalities. CBC without leukocytosis or significant anemia.   Imaging Studies:  CT imaging was not ordered as patient did have a CT earlier this month which did not show any acute findings.  She is without any fever, nausea, vomiting.   Medicines ordered:  I ordered medication BENTYL including for abdominal cramping. Reevaluation  of the patient after these medicines showed that the patient improved I have reviewed the patients home medicines and have made adjustments as needed  Reevaluation:  After the interventions noted above I re-evaluated patient and found that they have :resolved   Social Determinants of Health:  The patient's social determinants of health were a factor in the care of this patient    Problem List / ED Course:  Patient here with chronic abdominal pain, evaluated in the ED in the past for the same.  Does have a history of a external hemorrhoid which now has been bleeding.  Hemoglobin is stable on today's visit.  She deferred Hemoccult exam on today's visit.  CT without any acute findings.  She is nontoxic-appearing with stable vital signs, I did recommend some outpatient follow-up with gastroenterology.  She is able to plan and treatment tolerating p.o. adequately, stable for discharge.   Dispostion:  After consideration of the diagnostic results and the patients response to treatment, I feel that the patent would benefit from follow-up with gastroenterology.  We will go home on a short course of Bentyl to help with abdominal cramping.  Patient agrees to management, stable for discharge.    Portions of this note were generated with Scientist, clinical (histocompatibility and immunogenetics). Dictation errors may occur despite best attempts at proofreading.  Final Clinical Impression(s) / ED Diagnoses Final diagnoses:  Generalized abdominal pain    Rx / DC Orders ED Discharge Orders          Ordered    dicyclomine (BENTYL) 20 MG tablet  2 times daily        11/17/21 1218              Claude Manges, PA-C 11/17/21 1220    Mancel Bale, MD 11/17/21 1515

## 2021-11-17 NOTE — ED Triage Notes (Signed)
Pt reports abdominal pain and rectal bleeding  x2weeks. Pt reports having hemorrhoids.

## 2021-12-07 ENCOUNTER — Encounter: Payer: Self-pay | Admitting: Family Medicine

## 2021-12-07 ENCOUNTER — Ambulatory Visit (INDEPENDENT_AMBULATORY_CARE_PROVIDER_SITE_OTHER): Payer: Medicaid Other | Admitting: Family Medicine

## 2021-12-07 ENCOUNTER — Other Ambulatory Visit: Payer: Self-pay

## 2021-12-07 VITALS — BP 124/88 | HR 104 | Ht 66.0 in | Wt 112.2 lb

## 2021-12-07 DIAGNOSIS — R634 Abnormal weight loss: Secondary | ICD-10-CM

## 2021-12-07 DIAGNOSIS — R109 Unspecified abdominal pain: Secondary | ICD-10-CM | POA: Diagnosis not present

## 2021-12-07 DIAGNOSIS — Z7689 Persons encountering health services in other specified circumstances: Secondary | ICD-10-CM | POA: Diagnosis not present

## 2021-12-07 DIAGNOSIS — Z23 Encounter for immunization: Secondary | ICD-10-CM

## 2021-12-07 MED ORDER — DICYCLOMINE HCL 20 MG PO TABS: 20.0000 mg | ORAL_TABLET | Freq: Three times a day (TID) | ORAL | 0 refills | Status: DC

## 2021-12-07 NOTE — Progress Notes (Signed)
Pt also stated that she would like a referral/testing for food allergy. ?

## 2021-12-08 ENCOUNTER — Encounter: Payer: Self-pay | Admitting: Family Medicine

## 2021-12-08 NOTE — Progress Notes (Signed)
? ?New Patient Office Visit ? ?Subjective:  ?Patient ID: Adriana Jackson, adult    DOB: 12-Nov-2000  Age: 21 y.o. MRN: [id number] ? ?CC:  ?Chief Complaint  ?Patient presents with  ? Establish Care  ? ? ?HPI ?Adriana Jackson presents for to establish care. Patient reports that she has had intermittent abdominal pain that has hindered her from eating normally and she believes that she has lost weight though she is not quite sure how much and over what period of time. She reports some sx primarily after eating and feels like spasms.  ? ?Past Medical History:  ?Diagnosis Date  ? Asthma   ? ? ?No past surgical history on file. ? ?No family history on file. ? ?Social History  ? ?Socioeconomic History  ? Marital status: Single  ?  Spouse name: Not on file  ? Number of children: Not on file  ? Years of education: Not on file  ? Highest education level: Not on file  ?Occupational History  ? Not on file  ?Tobacco Use  ? Smoking status: Never  ? Smokeless tobacco: Never  ?Vaping Use  ? Vaping Use: Never used  ?Substance and Sexual Activity  ? Alcohol use: Yes  ?  Alcohol/week: 4.0 standard drinks  ?  Types: 4 Shots of liquor per week  ?  Comment: 4 shots/day  ? 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 Sexual activity: Yes  ?Other Topics Concern  ? Not on file  ?Social History Narrative  ? Not on file  ? ?Social Determinants of Health  ? ?Financial Resource Strain: Not on file  ?Food Insecurity: Not on file  ?Transportation Needs: Not on file  ?Physical Activity: Not on file  ?Stress: Not on file  ?Social Connections: Not on file  ?Intimate Partner Violence: Not on file  ? ? ?ROS ?Review of Systems  ?Constitutional:  Positive for unexpected weight change. Negative for chills and fever.  ?Gastrointestinal:  Positive for abdominal pain.  ?All other systems reviewed and are negative. ? ?Objective:  ? ?Today's Vitals: BP 124/88   Pulse (!) 104   Ht 5\' 6"  (1.676 m)   Wt 112 lb 3.2 oz (50.9 kg)   SpO2 98%   BMI 18.11  kg/m?  ? ?Physical Exam ?Vitals and nursing note reviewed.  ?Constitutional:   ?   General: Adriana Pickel "[name]" is not in acute distress. ?Cardiovascular:  ?   Rate and Rhythm: Normal rate and regular rhythm.  ?Pulmonary:  ?   Effort: Pulmonary effort is normal.  ?   Breath sounds: Normal breath sounds.  ?Abdominal:  ?   Palpations: Abdomen is soft.  ?   Tenderness: There is no abdominal tenderness.  ?Neurological:  ?   General: No focal deficit present.  ?   Mental Status: Adriana Darsey "[name]" is alert and oriented to person, place, and time.  ? ? ?Assessment & Plan:  ? ?1. Abdominal pain, unspecified abdominal location ?Referral to gi for further eval/mgt. Bentyl prescribed.  ? ?- Ambulatory referral to Gastroenterology ?- dicyclomine (BENTYL) 20 MG tablet; Take 1 tablet (20 mg total) by mouth 4 (four) times daily -  before meals and at bedtime.  Dispense: 120 tablet; Refill: 0 ? ?2. Loss of weight ?Not really able to determine significance at this time. Referral as above.  ? ?- Ambulatory referral to Gastroenterology ? ?3. Need for immunization against influenza ? ?- Flu Vaccine QUAD 49mo+IM (Fluarix, Fluzone &  Alfiuria Quad PF) ? ?4. Encounter to establish care ? ? ? ? ?Outpatient Encounter Medications as of 12/07/2021  ?Medication Sig  ? albuterol (VENTOLIN HFA) 108 (90 Base) MCG/ACT inhaler Inhale 2 puffs into the lungs every 6 (six) hours as needed for wheezing or shortness of breath.  ? Glucosamine HCl (GLUCOSAMINE PO) Take 1 tablet by mouth daily.  ? lamoTRIgine (LAMICTAL) 150 MG tablet Take 150 mg by mouth daily.  ? levocetirizine (XYZAL) 5 MG tablet Take 1 tablet (5 mg total) by mouth every evening.  ? sertraline (ZOLOFT) 50 MG tablet Take 1 tablet (50 mg total) by mouth at bedtime. (Patient taking differently: Take 150 mg by mouth daily.)  ? VITAMIN D PO Take 1 tablet by mouth daily.  ? dicyclomine (BENTYL) 20 MG tablet Take 1 tablet (20 mg total) by mouth 4 (four) times daily -  before meals and at  bedtime.  ? hydrocortisone (ANUSOL-HC) 25 MG suppository Place 1 suppository (25 mg total) rectally 2 (two) times daily. (Patient not taking: Reported on 12/07/2021)  ? [DISCONTINUED] clindamycin-benzoyl peroxide (BENZACLIN) gel Apply topically 2 (two) times daily.  ? [DISCONTINUED] cyclobenzaprine (FLEXERIL) 5 MG tablet Take 1 tablet (5 mg total) by mouth 3 (three) times daily as needed for muscle spasms.  ? [DISCONTINUED] dicyclomine (BENTYL) 20 MG tablet Take 1 tablet (20 mg total) by mouth 2 (two) times daily for 7 days.  ? [DISCONTINUED] hydrocortisone (ANUSOL-HC) 25 MG suppository Place 1 suppository (25 mg total) rectally 2 (two) times daily.  ? [DISCONTINUED] hydrOXYzine (ATARAX/VISTARIL) 25 MG tablet Take 1 tablet (25 mg total) by mouth 3 (three) times daily as needed for anxiety.  ? ?No facility-administered encounter medications on file as of 12/07/2021.  ? ? ?Follow-up: Return in about 4 weeks (around 01/04/2022) for follow up.  ? ?Tommie Raymond, MD ? ?

## 2021-12-19 ENCOUNTER — Encounter: Payer: Self-pay | Admitting: Gastroenterology

## 2021-12-19 ENCOUNTER — Other Ambulatory Visit: Payer: Medicaid Other

## 2021-12-19 ENCOUNTER — Ambulatory Visit (INDEPENDENT_AMBULATORY_CARE_PROVIDER_SITE_OTHER): Payer: Medicaid Other | Admitting: Gastroenterology

## 2021-12-19 VITALS — BP 110/82 | HR 91 | Ht 66.0 in | Wt 113.1 lb

## 2021-12-19 DIAGNOSIS — K649 Unspecified hemorrhoids: Secondary | ICD-10-CM

## 2021-12-19 DIAGNOSIS — K625 Hemorrhage of anus and rectum: Secondary | ICD-10-CM

## 2021-12-19 DIAGNOSIS — R110 Nausea: Secondary | ICD-10-CM

## 2021-12-19 DIAGNOSIS — R109 Unspecified abdominal pain: Secondary | ICD-10-CM | POA: Diagnosis not present

## 2021-12-19 DIAGNOSIS — K219 Gastro-esophageal reflux disease without esophagitis: Secondary | ICD-10-CM

## 2021-12-19 DIAGNOSIS — R634 Abnormal weight loss: Secondary | ICD-10-CM

## 2021-12-19 DIAGNOSIS — R194 Change in bowel habit: Secondary | ICD-10-CM

## 2021-12-19 MED ORDER — OMEPRAZOLE 20 MG PO CPDR: 20.0000 mg | DELAYED_RELEASE_CAPSULE | Freq: Every day | ORAL | 3 refills | Status: DC

## 2021-12-19 MED ORDER — CITRUCEL PO POWD: 1.0000 | Freq: Every day | ORAL | Status: DC

## 2021-12-19 MED ORDER — ONDANSETRON 4 MG PO TBDP: 4.0000 mg | ORAL_TABLET | Freq: Three times a day (TID) | ORAL | 1 refills | Status: DC | PRN

## 2021-12-19 MED ORDER — SUTAB 1479-225-188 MG PO TABS: 1.0000 | ORAL_TABLET | Freq: Once | ORAL | 0 refills | Status: AC

## 2021-12-19 NOTE — Patient Instructions (Addendum)
If you are age 23 or older, your body mass index should be between 23-30. Your Body mass index is 18.26 kg/m?Marland Kitchen If this is out of the aforementioned range listed, please consider follow up with your Primary Care Provider. ? ?If you are age 46 or younger, your body mass index should be between 19-25. Your Body mass index is 18.26 kg/m?Marland Kitchen If this is out of the aformentioned range listed, please consider follow up with your Primary Care Provider.  ? ?________________________________________________________ ? ?The [facility] providers would like to encourage you to use [facility] to communicate with providers for non-urgent requests or questions.  Due to long hold times on the telephone, sending your provider a message by [facility] may be a faster and more efficient way to get a response.  Please allow 48 business hours for a response.  Please remember that this is for non-urgent requests.  ?_______________________________________________________ ? ?Please go to the lab in the basement of our building to have lab work done as you leave today. Hit "B" for basement when you get on the elevator.  When the doors open the lab is on your left.  We will call you with the results. Thank you. ? ?You have been scheduled for an endoscopy and colonoscopy. Please follow the written instructions given to you at your visit today. ?Please pick up your prep supplies at the pharmacy within the next 1-3 days. ?If you use inhalers (even only as needed), please bring them with you on the day of your procedure. ? ?Continue Bentyl three times a day. ? ?We have sent the following medications to your pharmacy for you to pick up at your convenience: ?Zofran 4 mg ODT: Dissolve 1 tablet under your tongue every 8 hours as needed ?Omeprazole: Take  once daily ? ?Please purchase the following medications over the counter and take as directed: ?Citrucel: Use as directed once daily ? ?Thank you for entrusting me with your care and for choosing [employer], ?Dr. Ileene Patrick ? ? ?

## 2021-12-19 NOTE — Progress Notes (Signed)
? ?HPI :  ?21 y/o female with a history of bipolar disorder and asthma, referred by Dorna Mai for abdominal pain, rectal bleeding, altered bowel habits, and weight loss. ? ?The patient states that since the end of 2022, November or December timeframe, she developed abdominal pains that been bothering her.  Pain is in her mid to lower abdomen, she feels it quite frequently, usually ranges from 7-8 out of 10, feels it "all over".  Eating can often make her feel worse and makes her pain worse.  She been eating a lot less frequently due to the pain, she has a poor appetite with this and feels nauseated mostly all the time.  She does not vomit.  She does have heartburn that bothers her at times, no dysphagia.  She is not taking anything for reflux.  At the same time she has had erratic bowel habits.  Sometimes she has constipation, sometimes has loose stools.  She has had rectal bleeding frequently for the past few months.  She states she sees "a lot of blood" in the toilet when she moves her bowels, with most bowel movements.  She denies any perianal pain when she has a bowel movement.  Due to the symptoms she is lost about 12 pounds since December.  She does not think having a bowel movement can help her pain much.  She does have some gas and bloating at times, she does have a reported history of lactose intolerance.  Her sister has a "gluten intolerance", she has never been tested for celiac disease.  She denies any family history known of colon cancer or IBD.  She denies any NSAID use.  She does not take any new medications.  Her mother has IBS.  Her father has rheumatoid arthritis.  She does endorse having some joint pains at times.  Patient states she denies any problems with anesthesia in the past.  She states her asthma is under fair control she has not had any flares recently.  She denies any new medication changes around the time of her symptoms started, she is been on Zoloft for most a year, states she  has not noticed any bowel changes on it so far. ? ?She had a CT scan in December for the symptoms which was normal.  She had labs in February showing no anemia, no leukocytosis, normal lipase, normal LFTs. ? ?She denies any surgical history.  She has never had an EGD or colonoscopy.  She is currently a Ship broker at [employer].  She was given a trial of Bentyl for some of her pain she does think that helps when she takes it. ? ?CT abdomen / pelvis with contrast 09/19/21 - IMPRESSION: ?No acute findings in the abdomen or pelvis. Normal appearing ?appendix. ? ?CT renal stone study 08/29/20: ?IMPRESSION: ?No acute abnormality noted. ?  ?Recent lab evaluation: ?   ?Component Value Date/Time  ? WBC 6.5 11/17/2021 0945  ? RBC 4.27 11/17/2021 0945  ? HGB 13.1 11/17/2021 0945  ? HCT 38.9 11/17/2021 0945  ? PLT 284 11/17/2021 0945  ? MCV 91.1 11/17/2021 0945  ? MCH 30.7 11/17/2021 0945  ? MCHC 33.7 11/17/2021 0945  ? RDW 12.0 11/17/2021 0945  ? LYMPHSABS 1.8 11/17/2021 0945  ? MONOABS 0.8 11/17/2021 0945  ? EOSABS 0.1 11/17/2021 0945  ? BASOSABS 0.1 11/17/2021 0945  ? ? ?Lab Results  ?Component Value Date  ? CREATININE 0.74 11/17/2021  ? BUN 14 11/17/2021  ? NA 135 11/17/2021  ? K  4.4 11/17/2021  ? CL 103 11/17/2021  ? CO2 26 11/17/2021  ? ?Lab Results  ?Component Value Date  ? ALT 12 11/17/2021  ? AST 21 11/17/2021  ? ALKPHOS 52 11/17/2021  ? BILITOT 1.0 11/17/2021  ? ? ?Lab Results  ?Component Value Date  ? LIPASE 27 11/17/2021  ? ? ? ?Past Medical History:  ?Diagnosis Date  ? Asthma   ? Bipolar 1 disorder ([place])   ? ? ? ?History reviewed. No pertinent surgical history. ?Family History  ?Problem Relation Age of Onset  ? Colon cancer Neg Hx   ? Esophageal cancer Neg Hx   ? ?Social History  ? ?Tobacco Use  ? Smoking status: Never  ? Smokeless tobacco: Never  ?Vaping Use  ? Vaping Use: Never used  ?Substance Use Topics  ? Alcohol use: Not Currently  ?  Alcohol/week: 4.0 standard drinks  ?  Types: 4 Shots of liquor per week  ?  Comment:  4 shots/day  ? Drug use: Not Currently  ?  Types: Marijuana  ?  Comment: delta 8  ? ?Current Outpatient Medications  ?Medication Sig Dispense Refill  ? albuterol (VENTOLIN HFA) 108 (90 Base) MCG/ACT inhaler Inhale 2 puffs into the lungs every 6 (six) hours as needed for wheezing or shortness of breath. 8 g 0  ? dicyclomine (BENTYL) 20 MG tablet Take 1 tablet (20 mg total) by mouth 4 (four) times daily -  before meals and at bedtime. 120 tablet 0  ? Glucosamine HCl (GLUCOSAMINE PO) Take 1 tablet by mouth daily.    ? lamoTRIgine (LAMICTAL) 150 MG tablet Take 150 mg by mouth daily.    ? levocetirizine (XYZAL) 5 MG tablet Take 1 tablet (5 mg total) by mouth every evening. 30 tablet 0  ? methylcellulose (CITRUCEL) oral powder Take 1 packet by mouth daily.    ? omeprazole (PRILOSEC) 20 MG capsule Take 1 capsule (20 mg total) by mouth daily. 30 capsule 3  ? ondansetron (ZOFRAN-ODT) 4 MG disintegrating tablet Take 1 tablet (4 mg total) by mouth every 8 (eight) hours as needed for nausea or vomiting. 30 tablet 1  ? sertraline (ZOLOFT) 50 MG tablet Take 1 tablet (50 mg total) by mouth at bedtime. (Patient taking differently: Take 150 mg by mouth daily.) 30 tablet 0  ? Sodium Sulfate-Mag Sulfate-KCl (SUTAB) [id number] MG TABS Take 1 kit by mouth once for 1 dose. 24 tablet 0  ? VITAMIN D PO Take 1 tablet by mouth daily.    ? ?No current facility-administered medications for this visit.  ? ?No Known Allergies ? ? ?Review of Systems: ?All systems reviewed and negative except where noted in HPI.  ? ?Labs per HPI ? ? ?Physical Exam: ?BP 110/82   Pulse 91   Ht '5\' 6"'  (1.676 m)   Wt 113 lb 2 oz (51.3 kg)   BMI 18.26 kg/m?  ?Constitutional: Pleasant,well-developed, female in no acute distress. ?HEENT: Normocephalic and atraumatic. Conjunctivae are normal. No scleral icterus. ?Neck supple.  ?Cardiovascular: Normal rate, regular rhythm.  ?Pulmonary/chest: Effort normal and breath sounds normal. No wheezing, rales or  rhonchi. ?Abdominal: Soft, nondistended, nontender. There are no masses palpable. No hepatomegaly. ?DRE / Anoscopy - [facility] Alert as standby - internal hemorrhoids noted, posterior midline with small skin tag, no fissure. No mass lesions ?Extremities: no edema ?Lymphadenopathy: No cervical adenopathy noted. ?Neurological: Alert and oriented to person place and time. ?Skin: Skin is warm and dry. No rashes noted. ?Psychiatric: Normal mood and affect.  Behavior is normal. ? ? ?ASSESSMENT AND PLAN: ?21 year old female here for new patient assessment of the following: ? ?Abdominal pain ?Nausea ?GERD ?Rectal bleeding ?Altered bowel habits ?Internal hemorrhoids ?Weight loss ? ?Constellation of the following symptoms as outlined above for the past 4 to 5 months, states she was in her usual state of health before this happened.  She has had extensive work-up with labs and imaging so far which have all been normal, without clear cause.  She does have a history of reported lactose intolerance and sister who has a gluten intolerance although no formal testing for celiac disease.  I think we should screen her for celiac disease with labs, she is agreeable to that ? ?Discussed differential diagnosis for some of the symptoms.  She does have internal hemorrhoids noted on anoscopy and I suspect that is the more likely cause for her bleeding in the setting of altered bowel habits.  For her bowels and recommending she take Citrucel once daily to see if we can provide some regularity and minimize straining.  Bentyl has helped her so far for her abdominal discomfort and she should continue that 3 times daily.  I will add some Zofran for nausea and omeprazole for her reflux and see if that helps. ? ?We discussed how aggressive she wanted to be with her work-up for now.  We can do trial of medications and screen for celiac disease and see how she does over the next few weeks or months.  We also discussed role of EGD and colonoscopy in the  symptoms.  Given her weight loss, persistent symptoms, her father has autoimmune disease, she wants to proceed with endoscopy and colonoscopy to further evaluate, ensure no evidence of colitis and ensure no other cause f

## 2021-12-20 LAB — TISSUE TRANSGLUTAMINASE, IGA: (tTG) Ab, IgA: <1 U/mL

## 2021-12-20 LAB — IGA: Immunoglobulin A: 87 mg/dL (ref 47–310)

## 2021-12-22 ENCOUNTER — Encounter: Payer: Self-pay | Admitting: Gastroenterology

## 2022-01-05 ENCOUNTER — Ambulatory Visit (INDEPENDENT_AMBULATORY_CARE_PROVIDER_SITE_OTHER): Payer: Medicaid Other | Admitting: Family Medicine

## 2022-01-05 ENCOUNTER — Encounter: Payer: Self-pay | Admitting: Family Medicine

## 2022-01-05 VITALS — BP 106/74 | HR 86 | Temp 98.1°F | Resp 16 | Ht 66.0 in | Wt 110.0 lb

## 2022-01-05 DIAGNOSIS — R634 Abnormal weight loss: Secondary | ICD-10-CM

## 2022-01-05 DIAGNOSIS — R109 Unspecified abdominal pain: Secondary | ICD-10-CM

## 2022-01-05 MED ORDER — DICYCLOMINE HCL 20 MG PO TABS: 20.0000 mg | ORAL_TABLET | Freq: Three times a day (TID) | ORAL | 0 refills | Status: AC

## 2022-01-05 NOTE — Progress Notes (Signed)
Patient is here for Abdominal  pain  that has been recurring. Patient said pain is 7/10 and there is nothing that is helping pain. Patient has no other concerns today ?

## 2022-01-06 ENCOUNTER — Encounter: Payer: Self-pay | Admitting: Family Medicine

## 2022-01-06 NOTE — Progress Notes (Signed)
? ?Established Patient Office Visit ? ?Subjective:  ?Patient ID: Adriana Jackson, adult    DOB: 2001/05/11  Age: 21 y.o. MRN: [id number] ? ?CC:  ?Chief Complaint  ?Patient presents with  ? Abdominal Pain  ? ? ?HPI ?Adriana Jackson presents for follow up of abdominal pain. Patient reports that she has had some improvement with present management. She is currently scheduled to see GI for further eval/mgt of sx.  ? ?Past Medical History:  ?Diagnosis Date  ? Asthma   ? Bipolar 1 disorder ([facility])   ? ? ?No past surgical history on file. ? ?Family History  ?Problem Relation Age of Onset  ? Colon cancer Neg Hx   ? Esophageal cancer Neg Hx   ? ? ?Social History  ? ?Socioeconomic History  ? Marital status: Single  ?  Spouse name: Not on file  ? Number of children: Not on file  ? Years of education: Not on file  ? Highest education level: Not on file  ?Occupational History  ? Not on file  ?Tobacco Use  ? Smoking status: Never  ? Smokeless tobacco: Never  ?Vaping Use  ? Vaping Use: Never used  ?Substance and Sexual Activity  ? Alcohol use: Not Currently  ?  Alcohol/week: 4.0 standard drinks  ?  Types: 4 Shots of liquor per week  ?  Comment: 4 shots/day  ? Drug use: Not Currently  ?  Types: Marijuana  ?  Comment: delta 8  ? Sexual activity: Yes  ?Other Topics Concern  ? Not on file  ?Social History Narrative  ? Not on file  ? ?Social Determinants of Health  ? ?Financial Resource Strain: Not on file  ?Food Insecurity: Not on file  ?Transportation Needs: Not on file  ?Physical Activity: Not on file  ?Stress: Not on file  ?Social Connections: Not on file  ?Intimate Partner Violence: Not on file  ? ? ?ROS ?Review of Systems  ?Constitutional:  Positive for unexpected weight change.  ?Gastrointestinal:  Positive for abdominal pain. Negative for anal bleeding, constipation, diarrhea, nausea and vomiting.  ?All other systems reviewed and are negative. ? ?Objective:  ? ?Today's Vitals: BP 106/74   Pulse 86   Temp 98.1 ?F (36.7 ?C) (Oral)   Resp  16   Ht 5\' 6"  (1.676 m)   Wt 110 lb (49.9 kg)   SpO2 96%   BMI 17.75 kg/m?  ? ?Physical Exam ?Vitals and nursing note reviewed.  ?Constitutional:   ?   General: Adriana Jackson "[name]" is not in acute distress. ?Cardiovascular:  ?   Rate and Rhythm: Normal rate and regular rhythm.  ?Pulmonary:  ?   Effort: Pulmonary effort is normal.  ?   Breath sounds: Normal breath sounds.  ?Abdominal:  ?   Palpations: Abdomen is soft.  ?   Tenderness: There is generalized abdominal tenderness.  ?Neurological:  ?   General: No focal deficit present.  ?   Mental Status: Adriana Jackson "[name]" is alert and oriented to person, place, and time.  ? ? ?Assessment & Plan:  ? ?1. Abdominal pain, unspecified abdominal location ?Some minimal improvement with present management. Keep scheduled appt with consultant for further eval/mgt ? ?- dicyclomine (BENTYL) 20 MG tablet; Take 1 tablet (20 mg total) by mouth 4 (four) times daily -  before meals and at bedtime.  Dispense: 120 tablet; Refill: 0 ? ?2. Loss of weight ?Wt loss of 2-3 lbs over last 30 days. Follow up with consultant as above ? ? ? ?  Outpatient Encounter Medications as of 01/05/2022  ?Medication Sig  ? albuterol (VENTOLIN HFA) 108 (90 Base) MCG/ACT inhaler Inhale 2 puffs into the lungs every 6 (six) hours as needed for wheezing or shortness of breath.  ? Glucosamine HCl (GLUCOSAMINE PO) Take 1 tablet by mouth daily.  ? lamoTRIgine (LAMICTAL) 150 MG tablet Take 150 mg by mouth daily.  ? levocetirizine (XYZAL) 5 MG tablet Take 1 tablet (5 mg total) by mouth every evening.  ? methylcellulose (CITRUCEL) oral powder Take 1 packet by mouth daily.  ? omeprazole (PRILOSEC) 20 MG capsule Take 1 capsule (20 mg total) by mouth daily.  ? ondansetron (ZOFRAN-ODT) 4 MG disintegrating tablet Take 1 tablet (4 mg total) by mouth every 8 (eight) hours as needed for nausea or vomiting.  ? sertraline (ZOLOFT) 50 MG tablet Take 1 tablet (50 mg total) by mouth at bedtime. (Patient taking differently: Take  150 mg by mouth daily.)  ? VITAMIN D PO Take 1 tablet by mouth daily.  ? [DISCONTINUED] dicyclomine (BENTYL) 20 MG tablet Take 1 tablet (20 mg total) by mouth 4 (four) times daily -  before meals and at bedtime.  ? dicyclomine (BENTYL) 20 MG tablet Take 1 tablet (20 mg total) by mouth 4 (four) times daily -  before meals and at bedtime.  ? ?No facility-administered encounter medications on file as of 01/05/2022.  ? ? ?Follow-up: Return in about 3 months (around 04/06/2022) for follow up, physical.  ? ?Adriana Raymond, MD ? ?

## 2022-01-30 ENCOUNTER — Telehealth: Payer: Self-pay | Admitting: Family Medicine

## 2022-01-30 NOTE — Telephone Encounter (Signed)
Copied from CRM [id number]. Topic: General - Other ?>> Jan 30, 2022 10:10 AM Traci Sermon wrote: ?Reason for CRM: Pt called in wanting to get some labs done and requested if they could be ordered and she receive a call back to get it scheduled, please advise. ?

## 2022-01-31 ENCOUNTER — Other Ambulatory Visit: Payer: Self-pay | Admitting: *Deleted

## 2022-02-03 ENCOUNTER — Encounter: Payer: Self-pay | Admitting: *Deleted

## 2022-02-06 ENCOUNTER — Other Ambulatory Visit: Payer: Medicaid Other

## 2022-02-06 DIAGNOSIS — Z1329 Encounter for screening for other suspected endocrine disorder: Secondary | ICD-10-CM

## 2022-02-06 DIAGNOSIS — E538 Deficiency of other specified B group vitamins: Secondary | ICD-10-CM

## 2022-02-06 DIAGNOSIS — E559 Vitamin D deficiency, unspecified: Secondary | ICD-10-CM

## 2022-02-06 DIAGNOSIS — Z23 Encounter for immunization: Secondary | ICD-10-CM

## 2022-02-06 DIAGNOSIS — R109 Unspecified abdominal pain: Secondary | ICD-10-CM

## 2022-02-06 DIAGNOSIS — Z136 Encounter for screening for cardiovascular disorders: Secondary | ICD-10-CM

## 2022-02-06 NOTE — Progress Notes (Signed)
Patient came today to  get labs drawn per [facility] request ?

## 2022-02-07 ENCOUNTER — Telehealth: Payer: Self-pay | Admitting: *Deleted

## 2022-02-07 LAB — CBC WITH DIFFERENTIAL/PLATELET
Basophils Absolute: 0.1 10*3/uL (ref 0.0–0.2)
Basos: 1 %
EOS (ABSOLUTE): 0.1 10*3/uL (ref 0.0–0.4)
Eos: 2 %
Hematocrit: 37 % (ref 34.0–46.6)
Hemoglobin: 12.6 g/dL (ref 11.1–15.9)
Immature Grans (Abs): 0 10*3/uL (ref 0.0–0.1)
Immature Granulocytes: 0 %
Lymphocytes Absolute: 1.9 10*3/uL (ref 0.7–3.1)
Lymphs: 28 %
MCH: 30.9 pg (ref 26.6–33.0)
MCHC: 34.1 g/dL (ref 31.5–35.7)
MCV: 91 fL (ref 79–97)
Monocytes Absolute: 0.6 10*3/uL (ref 0.1–0.9)
Monocytes: 8 %
Neutrophils Absolute: 4 10*3/uL (ref 1.4–7.0)
Neutrophils: 61 %
Platelets: 283 10*3/uL (ref 150–450)
RBC: 4.08 x10E6/uL (ref 3.77–5.28)
RDW: 11.4 % — ABNORMAL LOW (ref 11.7–15.4)
WBC: 6.7 10*3/uL (ref 3.4–10.8)

## 2022-02-07 LAB — COMPREHENSIVE METABOLIC PANEL
ALT: 7 IU/L (ref 0–32)
AST: 17 IU/L (ref 0–40)
Albumin/Globulin Ratio: 2.2 (ref 1.2–2.2)
Albumin: 4.7 g/dL (ref 3.9–5.0)
Alkaline Phosphatase: 63 IU/L (ref 44–121)
BUN/Creatinine Ratio: 22 (ref 9–23)
BUN: 14 mg/dL (ref 6–20)
Bilirubin Total: 0.4 mg/dL (ref 0.0–1.2)
CO2: 24 mmol/L (ref 20–29)
Calcium: 9.4 mg/dL (ref 8.7–10.2)
Chloride: 103 mmol/L (ref 96–106)
Creatinine, Ser: 0.64 mg/dL (ref 0.57–1.00)
Globulin, Total: 2.1 g/dL (ref 1.5–4.5)
Glucose: 76 mg/dL (ref 70–99)
Potassium: 4.1 mmol/L (ref 3.5–5.2)
Sodium: 139 mmol/L (ref 134–144)
Total Protein: 6.8 g/dL (ref 6.0–8.5)
eGFR: 128 mL/min/{1.73_m2} (ref >59)

## 2022-02-07 LAB — VITAMIN D 25 HYDROXY (VIT D DEFICIENCY, FRACTURES): Vit D, 25-Hydroxy: 37.4 ng/mL (ref 30.0–100.0)

## 2022-02-07 LAB — B12 AND FOLATE PANEL
Folate: 7.1 ng/mL (ref >3.0)
Vitamin B-12: 467 pg/mL (ref 232–1245)

## 2022-02-07 NOTE — Telephone Encounter (Signed)
Call  was place to [facility] to change the dx code on the previous labs that were drawn on 02/06/2022 to [phone], I spoke with /asia S. ? ?Labs were fax to [facility] / Kara [phone] ?

## 2022-02-08 ENCOUNTER — Encounter: Payer: Self-pay | Admitting: Gastroenterology

## 2022-02-08 ENCOUNTER — Ambulatory Visit (AMBULATORY_SURGERY_CENTER): Payer: Medicaid Other | Admitting: Gastroenterology

## 2022-02-08 VITALS — BP 108/70 | HR 80 | Temp 98.1°F | Resp 9 | Ht 66.0 in | Wt 113.0 lb

## 2022-02-08 DIAGNOSIS — R109 Unspecified abdominal pain: Secondary | ICD-10-CM

## 2022-02-08 DIAGNOSIS — R194 Change in bowel habit: Secondary | ICD-10-CM | POA: Diagnosis not present

## 2022-02-08 DIAGNOSIS — R110 Nausea: Secondary | ICD-10-CM | POA: Diagnosis not present

## 2022-02-08 DIAGNOSIS — K625 Hemorrhage of anus and rectum: Secondary | ICD-10-CM | POA: Diagnosis not present

## 2022-02-08 DIAGNOSIS — R634 Abnormal weight loss: Secondary | ICD-10-CM

## 2022-02-08 DIAGNOSIS — K648 Other hemorrhoids: Secondary | ICD-10-CM | POA: Diagnosis not present

## 2022-02-08 MED ORDER — SODIUM CHLORIDE 0.9 % IV SOLN: 500.0000 mL | Freq: Once | INTRAVENOUS | Status: DC

## 2022-02-08 NOTE — Progress Notes (Signed)
Called to room to assist during endoscopic procedure.  Patient ID and intended procedure confirmed with present staff. Received instructions for my participation in the procedure from the performing physician.  

## 2022-02-08 NOTE — Patient Instructions (Signed)
Thank you for coming in to see [place] today! ?Resume previous diet and medications today.  Continue Citrucel daily, continue Bentyl as needed. ?May use Calmol 4 suppositories as needed for hemorrhoids ( this is an  over-the-counter product). ?Biopsy results will be available in 1-2 weeks. ? ? ?YOU HAD AN ENDOSCOPIC PROCEDURE TODAY AT THE [facility]:   Refer to the procedure report that was given to you for any specific questions about what was found during the examination.  If the procedure report does not answer your questions, please call your gastroenterologist to clarify.  If you requested that your care partner not be given the details of your procedure findings, then the procedure report has been included in a sealed envelope for you to review at your convenience later. ? ?YOU SHOULD EXPECT: Some feelings of bloating in the abdomen. Passage of more gas than usual.  Walking can help get rid of the air that was put into your GI tract during the procedure and reduce the bloating. If you had a lower endoscopy (such as a colonoscopy or flexible sigmoidoscopy) you may notice spotting of blood in your stool or on the toilet paper. If you underwent a bowel prep for your procedure, you may not have a normal bowel movement for a few days. ? ?Please Note:  You might notice some irritation and congestion in your nose or some drainage.  This is from the oxygen used during your procedure.  There is no need for concern and it should clear up in a day or so. ? ?SYMPTOMS TO REPORT IMMEDIATELY: ? ?Following lower endoscopy (colonoscopy or flexible sigmoidoscopy): ? Excessive amounts of blood in the stool ? Significant tenderness or worsening of abdominal pains ? Swelling of the abdomen that is new, acute ? Fever of 100?F or higher ? ?Following upper endoscopy (EGD) ? Vomiting of blood or coffee ground material ? New chest pain or pain under the shoulder blades ? Painful or persistently difficult swallowing ? New  shortness of breath ? Fever of 100?F or higher ? Black, tarry-looking stools ? ?For urgent or emergent issues, a gastroenterologist can be reached at any hour by calling [phone]. ?Do not use MyChart messaging for urgent concerns.  ? ? ?DIET:  We do recommend a small meal at first, but then you may proceed to your regular diet.  Drink plenty of fluids but you should avoid alcoholic beverages for 24 hours. ? ?ACTIVITY:  You should plan to take it easy for the rest of today and you should NOT DRIVE or use heavy machinery until tomorrow (because of the sedation medicines used during the test).   ? ?FOLLOW UP: ?Our staff will call the number listed on your records 48-72 hours following your procedure to check on you and address any questions or concerns that you may have regarding the information given to you following your procedure. If we do not reach you, we will leave a message.  We will attempt to reach you two times.  During this call, we will ask if you have developed any symptoms of COVID 19. If you develop any symptoms (ie: fever, flu-like symptoms, shortness of breath, cough etc.) before then, please call [phone].  If you test positive for Covid 19 in the 2 weeks post procedure, please call and report this information to [place].   ? ?If any biopsies were taken you will be contacted by phone or by letter within the next 1-3 weeks.  Please call us at [phone] if you have not heard about the biopsies in 3 weeks.  ? ? ?SIGNATURES/CONFIDENTIALITY: ?You and/or your care partner have signed paperwork which will be entered into your electronic medical record.  These signatures attest to the fact that that the information above on your After Visit Summary has been reviewed and is understood.  Full responsibility of the confidentiality of this discharge information lies with you and/or your care-partner.  ?

## 2022-02-08 NOTE — Progress Notes (Signed)
To pacu, VSS. Report to [email or web address] 

## 2022-02-08 NOTE — Progress Notes (Signed)
VS completed by CW.   Pt's states no medical or surgical changes since previsit or office visit.  

## 2022-02-08 NOTE — Op Note (Signed)
[facility] ?Patient Name: Adriana Jackson ?Procedure Date: 02/08/2022 8:53 AM ?MRN: [id number] ?Endoscopist: Carlota Raspberry. Havery Moros , MD ?Age: 21 ?Referring MD:  ?Date of Birth: August 01, 2001 ?Gender: Female ?Account #: [id number] ?Procedure:                Upper GI endoscopy ?Indications:              Abdominal pain, Nausea, Weight loss, altered bowel  ?                          habits - improved with trial of Zofran / Omeprazole  ?                          since clinic visit. CT scan 08/2021 without any  ?                          concerning findings. Labs reassuring, celiac  ?                          antibody negative ?Medicines:                Monitored Anesthesia Care ?Procedure:                Pre-Anesthesia Assessment: ?                          - Prior to the procedure, a History and Physical  ?                          was performed, and patient medications and  ?                          allergies were reviewed. The patient's tolerance of  ?                          previous anesthesia was also reviewed. The risks  ?                          and benefits of the procedure and the sedation  ?                          options and risks were discussed with the patient.  ?                          All questions were answered, and informed consent  ?                          was obtained. Prior Anticoagulants: The patient has  ?                          taken no previous anticoagulant or antiplatelet  ?                          agents. ASA Grade Assessment: II - A patient with  ?  mild systemic disease. After reviewing the risks  ?                          and benefits, the patient was deemed in  ?                          satisfactory condition to undergo the procedure. ?                          After obtaining informed consent, the endoscope was  ?                          passed under direct vision. Throughout the  ?                          procedure, the patient's blood pressure,  pulse, and  ?                          oxygen saturations were monitored continuously. The  ?                          GIF [id number] was introduced through the  ?                          mouth, and advanced to the second part of duodenum.  ?                          The upper GI endoscopy was accomplished without  ?                          difficulty. The patient tolerated the procedure  ?                          well. ?Scope In: ?Scope Out: ?Findings:                 Esophagogastric landmarks were identified: the  ?                          Z-line was found at 40 cm, the gastroesophageal  ?                          junction was found at 40 cm and the upper extent of  ?                          the gastric folds was found at 40 cm from the  ?                          incisors. ?                          The exam of the esophagus was otherwise normal. ?                          There was extrinsic  compression of the fundus from  ?                          another organ. The entire examined stomach was  ?                          otherwise entirely normal. Biopsies were taken with  ?                          a cold forceps for Helicobacter pylori testing.  ?                          Review of prior CT scan shows no concerning  ?                          findings in the abdomen. ?                          The duodenal bulb and second portion of the  ?                          duodenum were normal. Biopsies for histology were  ?                          taken with a cold forceps for evaluation of celiac  ?                          disease. ?Complications:            No immediate complications. Estimated blood loss:  ?                          Minimal. ?Estimated Blood Loss:     Estimated blood loss was minimal. ?Impression:               - Esophagogastric landmarks identified. ?                          - Normal esophagus otherwise ?                          - Extrinsic compression of the fundus - relatively  ?                           recent CT scan showed no concerning findings,  ?                          likely representing normal variant, extrinsic  ?                          compresion by another organ ?                          - Normal stomach. Biopsied. ?                          -  Normal duodenal bulb and second portion of the  ?                          duodenum. Biopsied. ?Recommendation:           - Patient has a contact number available for  ?                          emergencies. The signs and symptoms of potential  ?                          delayed complications were discussed with the  ?                          patient. Return to normal activities tomorrow.  ?                          Written discharge instructions were provided to the  ?                          patient. ?                          - Resume previous diet. ?                          - Continue present medications. ?                          - Await pathology results. ?Carlota Raspberry. Cecely Rengel, MD ?02/08/2022 9:47:13 AM ?This report has been signed electronically. ?

## 2022-02-08 NOTE — Op Note (Signed)
[place] ?Patient Name: Adriana Jackson ?Procedure Date: 02/08/2022 8:51 AM ?MRN: [id number] ?Endoscopist: Carlota Raspberry. Havery Moros , MD ?Age: 21 ?Referring MD:  ?Date of Birth: 03-06-2001 ?Gender: Female ?Account #: [id number] ?Procedure:                Colonoscopy ?Indications:              Rectal bleeding, Change in bowel habits, Abdominal  ?                          pain, Weight loss - on Citrucel and Bentyl with  ?                          improvement since the office visit ?Medicines:                Monitored Anesthesia Care ?Procedure:                Pre-Anesthesia Assessment: ?                          - Prior to the procedure, a History and Physical  ?                          was performed, and patient medications and  ?                          allergies were reviewed. The patient's tolerance of  ?                          previous anesthesia was also reviewed. The risks  ?                          and benefits of the procedure and the sedation  ?                          options and risks were discussed with the patient.  ?                          All questions were answered, and informed consent  ?                          was obtained. Prior Anticoagulants: The patient has  ?                          taken no previous anticoagulant or antiplatelet  ?                          agents. ASA Grade Assessment: II - A patient with  ?                          mild systemic disease. After reviewing the risks  ?                          and benefits, the patient was deemed in  ?  satisfactory condition to undergo the procedure. ?                          After obtaining informed consent, the colonoscope  ?                          was passed under direct vision. Throughout the  ?                          procedure, the patient's blood pressure, pulse, and  ?                          oxygen saturations were monitored continuously. The  ?                          0405 PCF-H190TL Slim  SB Colonoscope was introduced  ?                          through the anus and advanced to the the terminal  ?                          ileum, with identification of the appendiceal  ?                          orifice and IC valve. The colonoscopy was performed  ?                          without difficulty. The patient tolerated the  ?                          procedure well. The quality of the bowel  ?                          preparation was adequate. The terminal ileum,  ?                          ileocecal valve, appendiceal orifice, and rectum  ?                          were photographed. ?Scope In: 9:14:40 AM ?Scope Out: 9:33:33 AM ?Scope Withdrawal Time: 0 hours 16 minutes 0 seconds  ?Total Procedure Duration: 0 hours 18 minutes 53 seconds  ?Findings:                 Hemorrhoids were found on perianal exam. ?                          The terminal ileum appeared normal. ?                          The colon was tortuous. This exam was performed  ?                          using Ultraslim pediatric colonoscope given the  ?  patient's BMI ?                          Internal hemorrhoids were found during  ?                          retroflexion. The hemorrhoids were moderate. ?                          The exam was otherwise without abnormality. ?Complications:            No immediate complications. Estimated blood loss:  ?                          None. ?Estimated Blood Loss:     Estimated blood loss: none. ?Impression:               - Hemorrhoids found on perianal exam. ?                          - The examined portion of the ileum was normal. ?                          - Tortuous colon. ?                          - Internal hemorrhoids. ?                          - The examination was otherwise normal. ?                          Hemorrhoids are the cause of bleeding symptoms. No  ?                          evidence of Crohn's or colitis, suspect functional  ?                           bowel disorder, doing better with therapy as above ?Recommendation:           - Patient has a contact number available for  ?                          emergencies. The signs and symptoms of potential  ?                          delayed complications were discussed with the  ?                          patient. Return to normal activities tomorrow.  ?                          Written discharge instructions were provided to the  ?                          patient. ?                          -  Resume previous diet. ?                          - Continue present medications. ?                          - Continue Citrucel ?                          - Continue Bentyl PRN ?                          - Can use Calmol 4 suppositories as needed for  ?                          hemorrhoids (over the counter) ?Remo Lipps P. Lashawn Bromwell, MD ?02/08/2022 9:41:39 AM ?This report has been signed electronically. ?

## 2022-02-08 NOTE — Progress Notes (Signed)
[facility] History and Physical ? ? ?Primary Care Physician:  Georganna Skeans, MD ? ? ?Reason for Procedure:   Abdominal pain, altered bowel habits, rectal bleeding, nausea, weight loss ? ?Plan:    EGD and colonoscopy ? ? ? ? ?HPI: Adriana Jackson is a 21 y.o. adult  here for EGD and colonoscopy to evaluate symptoms as above. CT negative. Labs okay. Doing better on citrucel / bentyl / zofran / omeprazole since I have seen her in the office but not resolution. Otherwise feels well without any cardiopulmonary symptoms.  ? ? ?Past Medical History:  ?Diagnosis Date  ? Asthma   ? Bipolar 1 disorder (HCC)   ? ? ?History reviewed. No pertinent surgical history. ? ?Prior to Admission medications   ?Medication Sig Start Date End Date Taking? Authorizing Provider  ?dicyclomine (BENTYL) 20 MG tablet Take 1 tablet (20 mg total) by mouth 4 (four) times daily -  before meals and at bedtime. 01/05/22  Yes Georganna Skeans, MD  ?Glucosamine HCl (GLUCOSAMINE PO) Take 1 tablet by mouth daily.   Yes [provider]  ?lamoTRIgine (LAMICTAL) 150 MG tablet Take 150 mg by mouth daily.   Yes [provider]  ?levocetirizine (XYZAL) 5 MG tablet Take 1 tablet (5 mg total) by mouth every evening. 08/12/21  Yes Margaretann Loveless, PA-C  ?methylcellulose (CITRUCEL) oral powder Take 1 packet by mouth daily. 12/19/21  Yes Aryssa Rosamond, Willaim Rayas, MD  ?omeprazole (PRILOSEC) 20 MG capsule Take 1 capsule (20 mg total) by mouth daily. 12/19/21  Yes Immaculate Crutcher, Willaim Rayas, MD  ?sertraline (ZOLOFT) 50 MG tablet Take 1 tablet (50 mg total) by mouth at bedtime. ?Patient taking differently: Take 150 mg by mouth daily. 04/05/21  Yes Antonieta Pert, MD  ?VITAMIN D PO Take 1 tablet by mouth daily.   Yes [provider]  ?albuterol (VENTOLIN HFA) 108 (90 Base) MCG/ACT inhaler Inhale 2 puffs into the lungs every 6 (six) hours as needed for wheezing or shortness of breath. 06/05/21   Leath-Warren, Sadie Haber, NP  ?ondansetron  (ZOFRAN-ODT) 4 MG disintegrating tablet Take 1 tablet (4 mg total) by mouth every 8 (eight) hours as needed for nausea or vomiting. 12/19/21   Dorianne Perret, Willaim Rayas, MD  ? ? ?Current Outpatient Medications  ?Medication Sig Dispense Refill  ? dicyclomine (BENTYL) 20 MG tablet Take 1 tablet (20 mg total) by mouth 4 (four) times daily -  before meals and at bedtime. 120 tablet 0  ? Glucosamine HCl (GLUCOSAMINE PO) Take 1 tablet by mouth daily.    ? lamoTRIgine (LAMICTAL) 150 MG tablet Take 150 mg by mouth daily.    ? levocetirizine (XYZAL) 5 MG tablet Take 1 tablet (5 mg total) by mouth every evening. 30 tablet 0  ? methylcellulose (CITRUCEL) oral powder Take 1 packet by mouth daily.    ? omeprazole (PRILOSEC) 20 MG capsule Take 1 capsule (20 mg total) by mouth daily. 30 capsule 3  ? sertraline (ZOLOFT) 50 MG tablet Take 1 tablet (50 mg total) by mouth at bedtime. (Patient taking differently: Take 150 mg by mouth daily.) 30 tablet 0  ? VITAMIN D PO Take 1 tablet by mouth daily.    ? albuterol (VENTOLIN HFA) 108 (90 Base) MCG/ACT inhaler Inhale 2 puffs into the lungs every 6 (six) hours as needed for wheezing or shortness of breath. 8 g 0  ? ondansetron (ZOFRAN-ODT) 4 MG disintegrating tablet Take 1 tablet (4 mg total) by mouth every 8 (eight) hours as needed  for nausea or vomiting. 30 tablet 1  ? ?Current Facility-Administered Medications  ?Medication Dose Route Frequency Provider Last Rate Last Admin  ? 0.9 %  sodium chloride infusion  500 mL Intravenous Once Maylene Crocker, Willaim Rayas, MD      ? ? ?Allergies as of 02/08/2022  ? (No Known Allergies)  ? ? ?Family History  ?Problem Relation Age of Onset  ? Colon cancer Neg Hx   ? Esophageal cancer Neg Hx   ? ? ?Social History  ? ?Socioeconomic History  ? Marital status: Single  ?  Spouse name: Not on file  ? Number of children: Not on file  ? Years of education: Not on file  ? Highest education level: Not on file  ?Occupational History  ? Not on file  ?Tobacco Use  ? Smoking  status: Never  ? Smokeless tobacco: Never  ?Vaping Use  ? Vaping Use: Never used  ?Substance and Sexual Activity  ? Alcohol use: Not Currently  ?  Alcohol/week: 4.0 standard drinks  ?  Types: 4 Shots of liquor per week  ?  Comment: 4 shots/day  ? Drug use: Not Currently  ?  Types: Marijuana  ?  Comment: delta 8  ? Sexual activity: Yes  ?Other Topics Concern  ? Not on file  ?Social History Narrative  ? Not on file  ? ?Social Determinants of Health  ? ?Financial Resource Strain: Not on file  ?Food Insecurity: Not on file  ?Transportation Needs: Not on file  ?Physical Activity: Not on file  ?Stress: Not on file  ?Social Connections: Not on file  ?Intimate Partner Violence: Not on file  ? ? ?Review of Systems: ?All other review of systems negative except as mentioned in the HPI. ? ?Physical Exam: ?Vital signs ?BP 110/76   Pulse 86   Temp 98.1 ?F (36.7 ?C) (Temporal)   Resp 13   Ht 5\' 6"  (1.676 m)   Wt 113 lb (51.3 kg)   SpO2 100%   BMI 18.24 kg/m?  ? ?General:   Alert,  Well-developed, pleasant and cooperative in NAD ?Lungs:  Clear throughout to auscultation.   ?Heart:  Regular rate and rhythm ?Abdomen:  Soft, nontender and nondistended.   ?Neuro/Psych:  Alert and cooperative. Normal mood and affect. A and O x 3 ? ? , MD ?[facility] ? ? ?

## 2022-02-10 ENCOUNTER — Telehealth: Payer: Self-pay

## 2022-02-10 NOTE — Telephone Encounter (Signed)
  Follow up Call-     02/08/2022    7:59 AM  Call back number  Post procedure Call Back phone  # [phone]  Permission to leave phone message Yes     Patient questions:  Do you have a fever, pain , or abdominal swelling? Yes.   Pain Score  8 *  Have you tolerated food without any problems? Yes.    Have you been able to return to your normal activities? Yes.    Do you have any questions about your discharge instructions: Diet   No. Medications  No. Follow up visit  No.  Do you have questions or concerns about your Care? No.  Actions: * If pain score is 4 or above: Physician/ provider Notified : Willaim Rayas. Armbruster, MD.  Upon call backs, pt c/o abdominal pain, rating as an "8."  States abdomen seems "a little swollen."  No fever, nausea, or throat soreness.  She states she passed gas the day of her procedure, but hasn't passed air since then.  She is able to eat without issues.   Dr. Adela Lank,  Please advise

## 2022-02-10 NOTE — Telephone Encounter (Signed)
I called the patient.  She states she feels the same as she did preprocedure, this is one of the reasons we were doing the exam.  She is not in any distress, feeling at baseline otherwise.  She is not using Bentyl as I had previously recommended.  I asked her to take the Bentyl as needed.  I will contact her with biopsy results when they become available, hopefully sometime next week.  She agrees.

## 2022-03-21 ENCOUNTER — Encounter: Payer: Self-pay | Admitting: Gastroenterology

## 2022-03-21 ENCOUNTER — Ambulatory Visit (INDEPENDENT_AMBULATORY_CARE_PROVIDER_SITE_OTHER): Payer: Medicaid Other | Admitting: Gastroenterology

## 2022-03-21 VITALS — BP 90/70 | HR 88 | Ht 66.0 in | Wt 121.0 lb

## 2022-03-21 DIAGNOSIS — R194 Change in bowel habit: Secondary | ICD-10-CM | POA: Diagnosis not present

## 2022-03-21 DIAGNOSIS — K625 Hemorrhage of anus and rectum: Secondary | ICD-10-CM | POA: Diagnosis not present

## 2022-03-21 DIAGNOSIS — R109 Unspecified abdominal pain: Secondary | ICD-10-CM | POA: Diagnosis not present

## 2022-03-21 DIAGNOSIS — R110 Nausea: Secondary | ICD-10-CM | POA: Diagnosis not present

## 2022-03-21 MED ORDER — PROMETHAZINE HCL 12.5 MG PO TABS: 12.5000 mg | ORAL_TABLET | Freq: Two times a day (BID) | ORAL | 1 refills | Status: DC

## 2022-03-21 NOTE — Progress Notes (Signed)
HPI :  21 year old female here for a follow-up visit.  Recall she has been seen for abdominal pain, rectal bleeding, altered bowel habits, weight loss, nausea.  See prior clinic notes for full details of her history.  To date she has had labs which have showed no anemia or inflammatory changes, negative for celiac testing, EGD and colonoscopy without concerning findings, CT scan abdomen pelvis in the past unremarkable.  She had internal hemorrhoids noted as the likely cause for her bleeding.  At her last visit I had recommended a trial of omeprazole, Zofran as needed for nausea, Bentyl as needed for pain, Citrucel for her bowel habits and bleeding.  She has been doing she thinks about the same since of last seen her but doing better in certain areas.  Her rectal bleeding has resolved, she is not having any more of that.  She still has some mild constipation.  We had discussed using a fiber supplement over-the-counter such as Citrucel, she has not yet tried that yet.  Main symptom is ongoing abdominal discomfort.  She can feel this in her abdomen diffusely, anywhere from her upper abdomen, to middle, to lower abdomen.  Seems to be present most of the time, rated 6-7 out of 10.  Sometimes bowel movement can help make her feel better.  Sometimes eating can make her feel worse.  She has no focal point of pain that is worse than the others, does not radiate to the right shoulder or back.  She does think Bentyl can help somewhat so she has been taking it 3 times daily.  She states she has regained some weight since have last seen her, weight loss has stopped.  She is not having any reflux symptoms at present time.  She is not taking any NSAIDs.  I reviewed her medication list, she has been on Lamictal for the past year or so.  It is possible her symptoms could correlate to timeline of the Lamictal.  Unfortunately her nausea persists, she states the Zofran is not really helping that much. No vomiting. Again  she had tested negative for celiac disease.  She does endorse some lactose intolerance and avoids dairy.  She is currently a Consulting civil engineer at [employer].     CT abdomen / pelvis with contrast 09/19/21 - IMPRESSION: No acute findings in the abdomen or pelvis. Normal appearing appendix.   CT renal stone study 08/29/20: IMPRESSION: No acute abnormality noted.   EGD 02/08/22: - The exam of the esophagus was otherwise normal. - There was extrinsic compression of the fundus from another organ. The entire examined stomach was otherwise entirely normal. Biopsies were taken with a cold forceps for Helicobacter pylori testing. Review of prior CT scan shows no concerning findings in the abdomen. - The duodenal bulb and second portion of the duodenum were normal. Biopsies for histology were taken with a cold forceps for evaluation of celiac disease.  Colonoscopy 02/08/22: Hemorrhoids were found on perianal exam. - The terminal ileum appeared normal. - The colon was tortuous. This exam was performed using Ultraslim pediatric colonoscope given the patient's BMI - Internal hemorrhoids were found during retroflexion. The hemorrhoids were moderate. - The exam was otherwise without abnormality.  1. Surgical [P], duodenum and stomach - DUODENAL MUCOSA WITH NORMAL VILLOUS ARCHITECTURE. - NO VILLOUS ATROPHY OR INCREASED INTRAEPITHELIAL LYMPHOCYTES. 2. Surgical [P], gastric antrum and gastric body - UNREMARKABLE ANTRAL AND OXYNTIC MUCOSA. - NO HELICOBACTER PYLORI IDENTIFIED.    Past Medical History:  Diagnosis Date  Asthma    Bipolar 1 disorder (HCC)      No past surgical history on file. Family History  Problem Relation Age of Onset   Colon cancer Neg Hx    Esophageal cancer Neg Hx    Social History   Tobacco Use   Smoking status: Never   Smokeless tobacco: Never  Vaping Use   Vaping Use: Never used  Substance Use Topics   Alcohol use: Not Currently    Alcohol/week: 4.0 standard drinks of  alcohol    Types: 4 Shots of liquor per week    Comment: 4 shots/day   Drug use: Not Currently    Types: Marijuana    Comment: delta 8   Current Outpatient Medications  Medication Sig Dispense Refill   albuterol (VENTOLIN HFA) 108 (90 Base) MCG/ACT inhaler Inhale 2 puffs into the lungs every 6 (six) hours as needed for wheezing or shortness of breath. 8 g 0   dicyclomine (BENTYL) 20 MG tablet Take 1 tablet (20 mg total) by mouth 4 (four) times daily -  before meals and at bedtime. 120 tablet 0   Glucosamine HCl (GLUCOSAMINE PO) Take 1 tablet by mouth daily.     lamoTRIgine (LAMICTAL) 150 MG tablet Take 150 mg by mouth daily.     levocetirizine (XYZAL) 5 MG tablet Take 1 tablet (5 mg total) by mouth every evening. 30 tablet 0   methylcellulose (CITRUCEL) oral powder Take 1 packet by mouth daily.     omeprazole (PRILOSEC) 20 MG capsule Take 1 capsule (20 mg total) by mouth daily. 30 capsule 3   ondansetron (ZOFRAN-ODT) 4 MG disintegrating tablet Take 1 tablet (4 mg total) by mouth every 8 (eight) hours as needed for nausea or vomiting. 30 tablet 1   sertraline (ZOLOFT) 50 MG tablet Take 1 tablet (50 mg total) by mouth at bedtime. (Patient taking differently: Take 150 mg by mouth daily.) 30 tablet 0   VITAMIN D PO Take 1 tablet by mouth daily.     No current facility-administered medications for this visit.   No Known Allergies   Review of Systems: All systems reviewed and negative except where noted in HPI.   Lab Results  Component Value Date   WBC 6.7 02/06/2022   HGB 12.6 02/06/2022   HCT 37.0 02/06/2022   MCV 91 02/06/2022   PLT 283 02/06/2022    Lab Results  Component Value Date   CREATININE 0.64 02/06/2022   BUN 14 02/06/2022   NA 139 02/06/2022   K 4.1 02/06/2022   CL 103 02/06/2022   CO2 24 02/06/2022    Lab Results  Component Value Date   ALT 7 02/06/2022   AST 17 02/06/2022   ALKPHOS 63 02/06/2022   BILITOT 0.4 02/06/2022    Physical Exam: BP 90/70    Pulse 88   Ht 5\' 6"  (1.676 m)   Wt 121 lb (54.9 kg)   LMP 02/23/2022 (Approximate)   BMI 19.53 kg/m  Constitutional: Pleasant,well-developed, female in no acute distress. Abdominal: Soft, nondistended, mild mid to upper abdominal tenderness to palpation without mass lesions or peritoneal signs. There are no masses palpable.  Neurological: Alert and oriented to person place and time.Marland Kitchen Psychiatric: Normal mood and affect. Behavior is normal.   ASSESSMENT AND PLAN: 21 y/o female here for reassessment of the following:  Abdominal pain Nausea Altered bowel habits Rectal bleeding secondary to hemorrhoids  Reviewed work-up with the patient today, her CT scan imaging, lab work, EGD, colonoscopy, all  unremarkable without concerning findings.  Despite Zofran her nausea persists, Bentyl somewhat helps her pain.  Discussed differential diagnosis.  Reviewed her medications with her, time course of her symptom onset does line up with when she started Lamictal.  Lamictal can cause dyspepsia, nausea, I wonder if this could be related.  She is going to discuss this further with her psychiatrist to see if she can hold the drug for a few weeks and see if she feels any better.  Otherwise, she does have some upper abdominal pain, with some prandial association, biliary colic on the differential although would not account for her entire constellation of symptoms.  That being said we will obtain a right upper quadrant ultrasound to ensure no gallstones.  Otherwise, Zofran is not helping, we will give her some Phenergan to use as needed for nausea but counseled her on risks for sedation/drowsiness with this, she will try starting to use it at night before bed.  She is a Consulting civil engineer and may not tolerate it too well during the day.  She was counseled on this.  She has not yet tried Citrucel for her bowels, counseled her on this and she will get it over-the-counter.  She can use Calmol 4 as needed for hemorrhoidal bleeding,  hopefully with the fiber supplement this remains minimal.  Otherwise gave her some free samples of IBgard to use in the interim to see if this will help some of her pain.  She agrees with the plan as outlined.  Plan: - patient will talk with her psychiatrist about holding lamictal and options for treatment of her mental health if lamictal is held long term, if lamictal is thought to be causing some of her symptoms - stop Zofran, not helping - trial of Phenergan 12.5mg  every 12 hours PRN, will use it q HS initially and assess tolerance - RUQ [place] - rule out gallstones - start Citrucel daily - samples IB gard to use PRN - continue bentyl  Harlin Rain, MD [facility]

## 2022-03-29 ENCOUNTER — Ambulatory Visit (HOSPITAL_COMMUNITY)
Admission: RE | Admit: 2022-03-29 | Discharge: 2022-03-29 | Disposition: A | Discharge status: Home / Self Care | Payer: Medicaid Other | Source: Ambulatory Visit | Attending: Gastroenterology | Admitting: Gastroenterology

## 2022-03-29 DIAGNOSIS — K625 Hemorrhage of anus and rectum: Secondary | ICD-10-CM | POA: Diagnosis present

## 2022-03-29 DIAGNOSIS — R194 Change in bowel habit: Secondary | ICD-10-CM | POA: Insufficient documentation

## 2022-03-29 DIAGNOSIS — R110 Nausea: Secondary | ICD-10-CM | POA: Diagnosis present

## 2022-03-29 DIAGNOSIS — R109 Unspecified abdominal pain: Secondary | ICD-10-CM | POA: Diagnosis present

## 2022-04-06 ENCOUNTER — Ambulatory Visit (INDEPENDENT_AMBULATORY_CARE_PROVIDER_SITE_OTHER): Payer: Medicaid Other | Admitting: Family Medicine

## 2022-04-06 ENCOUNTER — Encounter: Payer: Self-pay | Admitting: Family Medicine

## 2022-04-06 VITALS — BP 106/73 | HR 77 | Temp 98.1°F | Resp 16 | Wt 120.6 lb

## 2022-04-06 DIAGNOSIS — R109 Unspecified abdominal pain: Secondary | ICD-10-CM

## 2022-04-06 NOTE — Progress Notes (Signed)
Patient would like a referral to an allergist. Patient has no other concerns

## 2022-04-07 ENCOUNTER — Encounter: Payer: Self-pay | Admitting: Family Medicine

## 2022-04-07 NOTE — Progress Notes (Signed)
Established Patient Office Visit  Subjective    Patient ID: Adriana Jackson, adult    DOB: Oct 07, 2000  Age: 21 y.o. MRN: [id number]  CC:  Chief Complaint  Patient presents with   Follow-up    HPI Adriana Jackson presents for wanting to be referred to allergist. She reports that she wants to know what she may be allergic to that is causing her stomach sx. She is under care of GI presently who indicated that she should discuss with BH that lamictal may be causing sx. Patient has not done that yet.    Outpatient Encounter Medications as of 04/06/2022  Medication Sig   albuterol (VENTOLIN HFA) 108 (90 Base) MCG/ACT inhaler Inhale 2 puffs into the lungs every 6 (six) hours as needed for wheezing or shortness of breath.   ARIPiprazole (ABILIFY) 5 MG tablet Take 5 mg by mouth at bedtime.   B-D 3CC LUER-LOK SYR 18GX1-1/2 18G X 1-1/2" 3 ML MISC See admin instructions.   busPIRone (BUSPAR) 10 MG tablet Take 10 mg by mouth 2 (two) times daily.   dicyclomine (BENTYL) 20 MG tablet Take 1 tablet (20 mg total) by mouth 4 (four) times daily -  before meals and at bedtime.   Glucosamine HCl (GLUCOSAMINE PO) Take 1 tablet by mouth daily.   lamoTRIgine (LAMICTAL) 150 MG tablet Take 150 mg by mouth daily.   levocetirizine (XYZAL) 5 MG tablet Take 1 tablet (5 mg total) by mouth every evening.   LUCIRA CHECK IT COVID-19 TEST KIT See admin instructions.   methylcellulose (CITRUCEL) oral powder Take 1 packet by mouth daily.   omeprazole (PRILOSEC) 20 MG capsule Take 1 capsule (20 mg total) by mouth daily.   promethazine (PHENERGAN) 12.5 MG tablet Take 1 tablet (12.5 mg total) by mouth every 12 (twelve) hours.   sertraline (ZOLOFT) 100 MG tablet Take 150 mg by mouth every morning.   sertraline (ZOLOFT) 50 MG tablet Take 1 tablet (50 mg total) by mouth at bedtime. (Patient taking differently: Take 150 mg by mouth daily.)   SURE COMFORT INSULIN SYRINGE 31G X 5/16" 1 ML MISC See admin instructions.   testosterone  cypionate (DEPOTESTOSTERONE CYPIONATE) 200 MG/ML injection SMARTSIG:0.2 Milliliter(s) SUB-Q Once a Week   VITAMIN D PO Take 1 tablet by mouth daily.   [DISCONTINUED] ARIPiprazole (ABILIFY) 2 MG tablet Take 2 mg by mouth at bedtime.   No facility-administered encounter medications on file as of 04/06/2022.    Past Medical History:  Diagnosis Date   Asthma    Bipolar 1 disorder ([place])     History reviewed. No pertinent surgical history.  Family History  Problem Relation Age of Onset   Colon cancer Neg Hx    Esophageal cancer Neg Hx     Social History   Socioeconomic History   Marital status: Single    Spouse name: Not on file   Number of children: Not on file   Years of education: Not on file   Highest education level: Not on file  Occupational History   Not on file  Tobacco Use   Smoking status: Never   Smokeless tobacco: Never  Vaping Use   Vaping Use: Never used  Substance and Sexual Activity   Alcohol use: Not Currently    Alcohol/week: 4.0 standard drinks of alcohol    Types: 4 Shots of liquor per week    Comment: 4 shots/day   Drug use: Not Currently    Types: Marijuana    Comment: delta 8  Sexual activity: Yes  Other Topics Concern   Not on file  Social History Narrative   Not on file   Social Determinants of Health   Financial Resource Strain: Not on file  Food Insecurity: Not on file  Transportation Needs: Not on file  Physical Activity: Not on file  Stress: Not on file  Social Connections: Not on file  Intimate Partner Violence: Not on file    Review of Systems  Gastrointestinal:  Positive for abdominal pain.  All other systems reviewed and are negative.       Objective    BP 106/73   Pulse 77   Temp 98.1 F (36.7 C) (Oral)   Resp 16   Wt 120 lb 9.6 oz (54.7 kg)   LMP 02/23/2022 (Approximate)   SpO2 96%   BMI 19.47 kg/m   Physical Exam Vitals and nursing note reviewed.  Constitutional:      General: Adriana Polivka "[name]" is not  in acute distress. Cardiovascular:     Rate and Rhythm: Normal rate and regular rhythm.  Pulmonary:     Effort: Pulmonary effort is normal.     Breath sounds: Normal breath sounds.  Abdominal:     Palpations: Abdomen is soft.     Tenderness: There is generalized abdominal tenderness.  Neurological:     General: No focal deficit present.     Mental Status: Adriana Redondo "[name]" is alert and oriented to person, place, and time.         Assessment & Plan:   1. Abdominal pain, unspecified abdominal location Patient to discuss with [facility] as recommended and keep scheduled follow ups with GI for further eval/mgt. Dicussed why allergy referral may not be appropriate at this time. Patient v.u.;    No follow-ups on file.   Becky Sax, MD

## 2022-04-13 ENCOUNTER — Telehealth: Payer: Medicaid Other | Admitting: Physician Assistant

## 2022-04-13 DIAGNOSIS — J4520 Mild intermittent asthma, uncomplicated: Secondary | ICD-10-CM | POA: Diagnosis not present

## 2022-04-14 MED ORDER — ALBUTEROL SULFATE HFA 108 (90 BASE) MCG/ACT IN AERS: 2.0000 | INHALATION_SPRAY | Freq: Four times a day (QID) | RESPIRATORY_TRACT | 0 refills | Status: DC | PRN

## 2022-04-14 NOTE — Progress Notes (Signed)
Visit for Asthma  Based on what you have shared with me, it looks like you may have a flare up of your asthma.  Asthma is a chronic (ongoing) lung disease which results in airway obstruction, inflammation and hyper-responsiveness.   Asthma symptoms vary from person to person, with common symptoms including nighttime awakening and decreased ability to participate in normal activities as a result of shortness of breath. It is often triggered by changes in weather, changes in the season, changes in air temperature, or inside (home, school, daycare or work) allergens such as animal dander, mold, mildew, woodstoves or cockroaches.   It can also be triggered by hormonal changes, extreme emotion, physical exertion or an upper respiratory tract illness.     It is important to identify the trigger, and then eliminate or avoid the trigger if possible.   If you have been prescribed medications to be taken on a regular basis, it is important to follow the asthma action plan and to follow guidelines to adjust medication in response to increasing symptoms of decreased peak expiratory flow rate  Treatment: I have prescribed: Albuterol (Proventil HFA; Ventolin HFA) 108 (90 Base) MCG/ACT Inhaler 2 puffs into the lungs every six hours as needed for wheezing or shortness of breath  HOME CARE Only take medications as instructed by your medical team. Consider wearing a mask or scarf to improve breathing air temperature have been shown to decrease irritation and decrease exacerbations Get rest. Taking a steamy shower or using a humidifier may help nasal congestion sand ease sore throat pain. You can place a towel over your head and breathe in the steam from hot water coming from a faucet. Using a saline nasal spray works much the same way.  Cough drops, hare candies and sore throat lozenges may ease your  cough.  Avoid close contacts especially the very you and the elderly Cover your mouth if you cough or sneeze Always remember to wash your hands.    GET HELP RIGHT AWAY IF: You develop worsening symptoms; breathlessness at rest, drowsy, confused or agitated, unable to speak in full sentences You have coughing fits You develop a severe headache or visual changes You develop shortness of breath, difficulty breathing or start having chest pain Your symptoms persist after you have completed your treatment plan If your symptoms do not improve within 10 days  MAKE SURE YOU Understand these instructions. Will watch your condition. Will get help right away if you are not doing well or get worse.   Your e-visit answers were reviewed by a board certified advanced clinical practitioner to complete your personal care plan, Depending upon the condition, your plan could have included both over the counter or prescription medications.   Please review your pharmacy choice. Your safety is important to us. If you have drug allergies check your prescription carefully.  You can use MyChart to ask questions about today's visit, request a non-urgent  call back, or ask for a work or school excuse for 24 hours related to this e-Visit. If it has been greater than 24 hours you will need to follow up with your provider, or enter a new e-Visit to address those concerns.   You will get an e-mail in the next two days asking about your experience. I hope that your e-visit has been valuable and will speed your recovery. Thank you for using e-visits.  I provided 5 minutes of non face-to-face time during this encounter for chart review and documentation.   

## 2022-06-24 ENCOUNTER — Telehealth: Payer: Medicaid Other | Admitting: Nurse Practitioner

## 2022-06-24 DIAGNOSIS — M545 Low back pain, unspecified: Secondary | ICD-10-CM

## 2022-06-24 MED ORDER — NAPROXEN 500 MG PO TABS: 500.0000 mg | ORAL_TABLET | Freq: Two times a day (BID) | ORAL | 0 refills | Status: DC

## 2022-06-24 MED ORDER — METHOCARBAMOL 500 MG PO TABS: 500.0000 mg | ORAL_TABLET | Freq: Three times a day (TID) | ORAL | 0 refills | Status: DC | PRN

## 2022-06-24 NOTE — Progress Notes (Signed)
We are sorry that you are not feeling well.  Here is how we plan to help! ? ?Based on what you have shared with me it looks like you mostly have acute back pain. ? ?Acute back pain is defined as musculoskeletal pain that can resolve in 1-3 weeks with conservative treatment. ? ?I have prescribed Naprosyn 500 mg take one by mouth twice a day non-steroid anti-inflammatory (NSAID) as well as methocarbamol as needed which is a muscle relaxer  Some patients experience stomach irritation or in increased heartburn with anti-inflammatory drugs.  Please keep in mind that muscle relaxer's can cause fatigue and should not be taken while at work or driving.  Back pain is very common.  The pain often gets better over time.  The cause of back pain is usually not dangerous.  Most people can learn to manage their back pain on their own. ? ?Home Care ?Stay active.  Start with short walks on flat ground if you can.  Try to walk farther each day. ?Do not sit, drive or stand in one place for more than 30 minutes.  Do not stay in bed. ?Do not avoid exercise or work.  Activity can help your back heal faster. ?Be careful when you bend or lift an object.  Bend at your knees, keep the object close to you, and do not twist. ?Sleep on a firm mattress.  Lie on your side, and bend your knees.  If you lie on your back, put a pillow under your knees. ?Only take medicines as told by your doctor. ?Put ice on the injured area. ?Put ice in a plastic bag ?Place a towel between your skin and the bag ?Leave the ice on for 15-20 minutes, 3-4 times a day for the first 2-3 days. 210 After that, you can switch between ice and heat packs. ?Ask your doctor about back exercises or massage. ?Avoid feeling anxious or stressed.  Find good ways to deal with stress, such as exercise. ? ?Get Help Right Way If: ?Your pain does not go away with rest or medicine. ?Your pain does not go away in 1 week. ?You have new problems. ?You do not feel well. ?The pain spreads into  your legs. ?You cannot control when you poop (bowel movement) or pee (urinate) ?You feel sick to your stomach (nauseous) or throw up (vomit) ?You have belly (abdominal) pain. ?You feel like you may pass out (faint). ?If you develop a fever. ? ?Make Sure you: ?Understand these instructions. ?Will watch your condition ?Will get help right away if you are not doing well or get worse. ? ?Your e-visit answers were reviewed by a board certified advanced clinical practitioner to complete your personal care plan.  Depending on the condition, your plan could have included both over the counter or prescription medications. ? ?If there is a problem please reply  once you have received a response from your provider. ? ?Your safety is important to us.  If you have drug allergies check your prescription carefully.   ? ?You can use MyChart to ask questions about today?s visit, request a non-urgent call back, or ask for a work or school excuse for 24 hours related to this e-Visit. If it has been greater than 24 hours you will need to follow up with your provider, or enter a new e-Visit to address those concerns. ? ?You will get an e-mail in the next two days asking about your experience.  I hope that your e-visit has been valuable   and will speed your recovery. Thank you for using e-visits.

## 2022-06-24 NOTE — Progress Notes (Signed)
I have spent 5 minutes in review of e-visit questionnaire, review and updating patient chart, medical decision making and response to patient.  ° °Brealyn Baril W Nazim Kadlec, NP ° °  °

## 2022-07-01 ENCOUNTER — Telehealth: Payer: Medicaid Other | Admitting: Physician Assistant

## 2022-07-01 DIAGNOSIS — L700 Acne vulgaris: Secondary | ICD-10-CM

## 2022-07-01 MED ORDER — CLINDAMYCIN PHOS-BENZOYL PEROX 1.2-5 % EX GEL: 1.0000 | Freq: Every day | CUTANEOUS | 0 refills | Status: DC

## 2022-07-01 NOTE — Progress Notes (Signed)
We are sorry that you are experiencing this issue.  Here is how we plan to help!  Based on what you shared with me it looks like you have uncomplicated acne.  Acne is a disorder of the hair follicles and oil glands (sebaceous glands). The sebaceous glands secrete oils to keep the skin moist.  When the glands get clogged, it can lead to pimples or cysts.  These cysts may become infected and leave scars. Acne is very common and normally occurs at puberty.  Acne is also inherited.  Your personal care plan consists of the following recommendations:  I recommend that you use a daily cleanser  You might try 2% topical salicylic acid pads or wipes.  Use the pads to daily cleanse your skin.  I have prescribed a topical gel with an antibiotic:  Clindamycin-benzoyl peroxide gel.  This gel should be applied to the affected areas twice a day.  Be sure to read the package insert to understand potential side effects.   If excessive dryness or peeling occurs, reduce dose frequency or concentration of the topical scrubs.  If excessive stinging or burning occurs, remove the topical gel with mild soap and water and resume at a lower dose the next day.  Remember oral antibiotics and topical acne treatments may increase your sensitivity to the sun!  HOME CARE: Do not squeeze pimples because that can often lead to infections, worse acne, and scars. Use a moisturizer that contains retinoid or fruit acids that may inhibit the development of new acne lesions. Although there is not a clear link that foods can cause acne, doctors do believe that too many sweets predispose you to skin problems.  GET HELP RIGHT AWAY IF: If your acne gets worse or is not better within 10 days. If you become depressed. If you become pregnant, discontinue medications and call your OB/GYN.  MAKE SURE YOU: Understand these instructions. Will watch your condition. Will get help right away if you are not doing well or get worse.  Thank  you for choosing an e-visit.  Your e-visit answers were reviewed by a board certified advanced clinical practitioner to complete your personal care plan. Depending upon the condition, your plan could have included both over the counter or prescription medications.  Please review your pharmacy choice. Make sure the pharmacy is open so you can pick up prescription now. If there is a problem, you may contact your provider through [employer] and have the prescription routed to another pharmacy.  Your safety is important to [place]. If you have drug allergies check your prescription carefully.   For the next 24 hours you can use MyChart to ask questions about today's visit, request a non-urgent call back, or ask for a work or school excuse. You will get an email in the next two days asking about your experience. I hope that your e-visit has been valuable and will speed your recovery.

## 2022-07-01 NOTE — Progress Notes (Signed)
I have spent 5 minutes in review of e-visit questionnaire, review and updating patient chart, medical decision making and response to patient.   Cyla Haluska Cody Loleta Frommelt, PA-C    

## 2022-07-12 ENCOUNTER — Ambulatory Visit: Payer: Medicaid Other | Admitting: Family Medicine

## 2022-07-12 ENCOUNTER — Encounter: Payer: Self-pay | Admitting: Family Medicine

## 2022-07-12 ENCOUNTER — Ambulatory Visit (INDEPENDENT_AMBULATORY_CARE_PROVIDER_SITE_OTHER): Payer: Medicaid Other

## 2022-07-12 VITALS — BP 123/86 | HR 89 | Temp 98.5°F | Resp 16 | Wt 125.8 lb

## 2022-07-12 DIAGNOSIS — M545 Low back pain, unspecified: Secondary | ICD-10-CM

## 2022-07-12 MED ORDER — TRIAMCINOLONE ACETONIDE 40 MG/ML IJ SUSP
40.0000 mg | Freq: Once | INTRAMUSCULAR | Status: AC
  Administered 2022-07-12: 40 mg via INTRAMUSCULAR

## 2022-07-12 MED ORDER — PROMETHAZINE HCL 25 MG/ML IJ SOLN
25.0000 mg | Freq: Four times a day (QID) | INTRAMUSCULAR | Status: DC | PRN
  Administered 2022-07-12: 25 mg via INTRAMUSCULAR

## 2022-07-12 MED ORDER — KETOROLAC TROMETHAMINE 60 MG/2ML IM SOLN
60.0000 mg | Freq: Once | INTRAMUSCULAR | Status: AC
  Administered 2022-07-12: 60 mg via INTRAMUSCULAR
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
## 2022-07-13 ENCOUNTER — Encounter: Payer: Self-pay | Admitting: Family Medicine

## 2022-07-13 NOTE — Progress Notes (Signed)
Established Patient Office Visit  Subjective    Patient ID: Adriana Jackson, adult    DOB: December 04, 2000  Age: 21 y.o. MRN: [id number]  CC:  Chief Complaint  Patient presents with   Back Pain    HPI Adriana Jackson presents for complaint of back pain for several months that is now persistent and worsening. Patient denies dysuria. Patient denies known trauma or injury. Activity seems to exacerbate sx.    Outpatient Encounter Medications as of 07/12/2022  Medication Sig   albuterol (VENTOLIN HFA) 108 (90 Base) MCG/ACT inhaler Inhale 2 puffs into the lungs every 6 (six) hours as needed for wheezing or shortness of breath.   B-D 3CC LUER-LOK SYR 18GX1-1/2 18G X 1-1/2" 3 ML MISC See admin instructions.   dicyclomine (BENTYL) 20 MG tablet Take 1 tablet (20 mg total) by mouth 4 (four) times daily -  before meals and at bedtime.   Glucosamine HCl (GLUCOSAMINE PO) Take 1 tablet by mouth daily.   lamoTRIgine (LAMICTAL) 150 MG tablet Take 150 mg by mouth daily.   levocetirizine (XYZAL) 5 MG tablet Take 1 tablet (5 mg total) by mouth every evening.   LUCIRA CHECK IT COVID-19 TEST KIT See admin instructions.   methocarbamol (ROBAXIN) 500 MG tablet Take 1 tablet (500 mg total) by mouth every 8 (eight) hours as needed for muscle spasms.   methylcellulose (CITRUCEL) oral powder Take 1 packet by mouth daily.   naproxen (NAPROSYN) 500 MG tablet Take 1 tablet (500 mg total) by mouth 2 (two) times daily with a meal.   sertraline (ZOLOFT) 100 MG tablet Take 150 mg by mouth every morning.   SURE COMFORT INSULIN SYRINGE 31G X 5/16" 1 ML MISC See admin instructions.   testosterone cypionate (DEPOTESTOSTERONE CYPIONATE) 200 MG/ML injection SMARTSIG:0.2 Milliliter(s) SUB-Q Once a Week   VITAMIN D PO Take 1 tablet by mouth daily.   [DISCONTINUED] ARIPiprazole (ABILIFY) 5 MG tablet Take 5 mg by mouth at bedtime.   [DISCONTINUED] busPIRone (BUSPAR) 10 MG tablet Take 10 mg by mouth 2 (two) times daily.   [DISCONTINUED]  Clindamycin-Benzoyl Per, Refr, gel Apply 1 Application topically daily.   [DISCONTINUED] omeprazole (PRILOSEC) 20 MG capsule Take 1 capsule (20 mg total) by mouth daily.   [DISCONTINUED] promethazine (PHENERGAN) 12.5 MG tablet Take 1 tablet (12.5 mg total) by mouth every 12 (twelve) hours.   [DISCONTINUED] sertraline (ZOLOFT) 50 MG tablet Take 1 tablet (50 mg total) by mouth at bedtime. (Patient taking differently: Take 150 mg by mouth daily.)   Facility-Administered Encounter Medications as of 07/12/2022  Medication   [COMPLETED] ketorolac (TORADOL) injection 60 mg   promethazine (PHENERGAN) injection 25 mg   [COMPLETED] triamcinolone acetonide (KENALOG-40) injection 40 mg    Past Medical History:  Diagnosis Date   Asthma    Bipolar 1 disorder ([place])     No past surgical history on file.  Family History  Problem Relation Age of Onset   Colon cancer Neg Hx    Esophageal cancer Neg Hx     Social History   Socioeconomic History   Marital status: Single    Spouse name: Not on file   Number of children: Not on file   Years of education: Not on file   Highest education level: Not on file  Occupational History   Not on file  Tobacco Use   Smoking status: Never   Smokeless tobacco: Never  Vaping Use   Vaping Use: Never used  Substance and Sexual Activity  Alcohol use: Not Currently    Alcohol/week: 4.0 standard drinks of alcohol    Types: 4 Shots of liquor per week    Comment: 4 shots/day   Drug use: Not Currently    Types: Marijuana    Comment: delta 8   Sexual activity: Yes  Other Topics Concern   Not on file  Social History Narrative   Not on file   Social Determinants of Health   Financial Resource Strain: Not on file  Food Insecurity: Not on file  Transportation Needs: Not on file  Physical Activity: Not on file  Stress: Not on file  Social Connections: Not on file  Intimate Partner Violence: Not on file    Review of Systems  Genitourinary:  Negative  for dysuria.  Musculoskeletal:  Positive for back pain.  All other systems reviewed and are negative.       Objective    BP 123/86   Pulse 89   Temp 98.5 F (36.9 C) (Oral)   Resp 16   Wt 125 lb 12.8 oz (57.1 kg)   SpO2 98%   BMI 20.30 kg/m   Physical Exam Vitals and nursing note reviewed.  Constitutional:      General: [name] is not in acute distress. Cardiovascular:     Rate and Rhythm: Normal rate and regular rhythm.  Pulmonary:     Effort: Pulmonary effort is normal.     Breath sounds: Normal breath sounds.  Abdominal:     Palpations: Abdomen is soft.     Tenderness: There is no abdominal tenderness.  Musculoskeletal:     Lumbar back: Tenderness present. No spasms. Decreased range of motion.  Neurological:     General: No focal deficit present.     Mental Status: Adriana Jackson "[name]" is alert and oriented to person, place, and time.         Assessment & Plan:   1. Midline low back pain without sciatica, unspecified chronicity Xray ordered. Kenalog, toradol, and phenergan IM injections given. Continue tylenol/nsaids/ topical preps prn - DG Lumbar Spine Complete; Future - triamcinolone acetonide (KENALOG-40) injection 40 mg - promethazine (PHENERGAN) injection 25 mg - ketorolac (TORADOL) injection 60 mg d   No follow-ups on file.   ,  P, MD   

## 2022-08-08 ENCOUNTER — Telehealth: Payer: Medicaid Other | Admitting: Family Medicine

## 2022-08-08 ENCOUNTER — Encounter: Payer: Self-pay | Admitting: Family Medicine

## 2022-08-08 DIAGNOSIS — L709 Acne, unspecified: Secondary | ICD-10-CM

## 2022-08-08 MED ORDER — CLINDAMYCIN PHOS-BENZOYL PEROX 1-5 % EX GEL: Freq: Two times a day (BID) | CUTANEOUS | 0 refills | Status: DC

## 2022-08-08 NOTE — Progress Notes (Signed)
We are sorry that you are experiencing this issue.  Here is how we plan to help!  Based on what you shared with me it looks like you have uncomplicated acne.  Acne is a disorder of the hair follicles and oil glands (sebaceous glands). The sebaceous glands secrete oils to keep the skin moist.  When the glands get clogged, it can lead to pimples or cysts.  These cysts may become infected and leave scars. Acne is very common and normally occurs at puberty.  Acne is also inherited.  Your personal care plan consists of the following recommendations:  I recommend that you use a daily cleanser  I have prescribed a topical gel with an antibiotic:  Clindamycin-benzoyl peroxide gel.  This gel should be applied to the affected areas twice a day.  Be sure to read the package insert to understand potential side effects.   If excessive dryness or peeling occurs, reduce dose frequency or concentration of the topical scrubs.  If excessive stinging or burning occurs, remove the topical gel with mild soap and water and resume at a lower dose the next day.  Remember oral antibiotics and topical acne treatments may increase your sensitivity to the sun!  HOME CARE: Do not squeeze pimples because that can often lead to infections, worse acne, and scars. Use a moisturizer that contains retinoid or fruit acids that may inhibit the development of new acne lesions. Although there is not a clear link that foods can cause acne, doctors do believe that too many sweets predispose you to skin problems.  GET HELP RIGHT AWAY IF: If your acne gets worse or is not better within 10 days. If you become depressed. If you become pregnant, discontinue medications and call your OB/GYN.  MAKE SURE YOU: Understand these instructions. Will watch your condition. Will get help right away if you are not doing well or get worse.  Thank you for choosing an e-visit.  Your e-visit answers were reviewed by a board certified advanced  clinical practitioner to complete your personal care plan. Depending upon the condition, your plan could have included both over the counter or prescription medications.  Please review your pharmacy choice. Make sure the pharmacy is open so you can pick up prescription now. If there is a problem, you may contact your provider through [employer] and have the prescription routed to another pharmacy.  Your safety is important to [place]. If you have drug allergies check your prescription carefully.   For the next 24 hours you can use MyChart to ask questions about today's visit, request a non-urgent call back, or ask for a work or school excuse. You will get an email in the next two days asking about your experience. I hope that your e-visit has been valuable and will speed your recovery.  I provided 5 minutes of non face-to-face time during this encounter for chart review, medication and order placement, as well as and documentation.

## 2022-08-10 ENCOUNTER — Other Ambulatory Visit: Payer: Self-pay | Admitting: Family Medicine

## 2022-08-10 DIAGNOSIS — L700 Acne vulgaris: Secondary | ICD-10-CM

## 2022-08-25 ENCOUNTER — Telehealth: Payer: Medicaid Other | Admitting: Physician Assistant

## 2022-08-25 DIAGNOSIS — R3989 Other symptoms and signs involving the genitourinary system: Secondary | ICD-10-CM | POA: Diagnosis not present

## 2022-08-25 MED ORDER — CEPHALEXIN 500 MG PO CAPS: 500.0000 mg | ORAL_CAPSULE | Freq: Two times a day (BID) | ORAL | 0 refills | Status: DC

## 2022-08-25 NOTE — Progress Notes (Signed)

## 2022-08-29 ENCOUNTER — Other Ambulatory Visit: Payer: Self-pay | Admitting: Family Medicine

## 2022-08-29 DIAGNOSIS — L709 Acne, unspecified: Secondary | ICD-10-CM

## 2022-08-30 ENCOUNTER — Other Ambulatory Visit (HOSPITAL_COMMUNITY): Payer: Self-pay

## 2022-08-30 MED ORDER — CLINDAMYCIN PHOS-BENZOYL PEROX 1-5 % EX GEL
Freq: Two times a day (BID) | CUTANEOUS | 0 refills | Status: DC
  Filled 2022-08-30: qty 25, 30d supply, fill #0

## 2022-09-27 ENCOUNTER — Telehealth: Payer: Medicaid Other | Admitting: Family Medicine

## 2022-09-27 DIAGNOSIS — R3989 Other symptoms and signs involving the genitourinary system: Secondary | ICD-10-CM

## 2022-09-27 MED ORDER — CEPHALEXIN 500 MG PO CAPS: 500.0000 mg | ORAL_CAPSULE | Freq: Two times a day (BID) | ORAL | 0 refills | Status: DC

## 2022-09-27 NOTE — Progress Notes (Signed)

## 2022-10-02 ENCOUNTER — Other Ambulatory Visit (HOSPITAL_COMMUNITY): Payer: Self-pay

## 2022-10-06 ENCOUNTER — Ambulatory Visit (INDEPENDENT_AMBULATORY_CARE_PROVIDER_SITE_OTHER): Payer: Medicaid Other | Admitting: Physician Assistant

## 2022-10-06 ENCOUNTER — Encounter: Payer: Self-pay | Admitting: Physician Assistant

## 2022-10-06 VITALS — BP 122/88 | HR 88 | Ht 66.0 in | Wt 118.0 lb

## 2022-10-06 DIAGNOSIS — K644 Residual hemorrhoidal skin tags: Secondary | ICD-10-CM | POA: Diagnosis not present

## 2022-10-06 DIAGNOSIS — K602 Anal fissure, unspecified: Secondary | ICD-10-CM | POA: Diagnosis not present

## 2022-10-06 DIAGNOSIS — K648 Other hemorrhoids: Secondary | ICD-10-CM | POA: Diagnosis not present

## 2022-10-06 MED ORDER — AMBULATORY NON FORMULARY MEDICATION: 1 refills | Status: DC

## 2022-10-06 NOTE — Progress Notes (Signed)
Subjective:    Patient ID: Adriana Jackson, adult    DOB: 2001-08-17, 22 y.o.   MRN: [id number]  HPI  Adriana "[name]" is a 22 year old nonbinary female, known to Dr. Adela Lank and last seen in June 2023 with complaints of abdominal pain and rectal bleeding.  She had undergone EGD in May 2023 which was negative with the exception of possible extrinsic compression of the stomach from another organ.  Biopsies of the duodenum were negative and she was H. pylori negative.  She underwent colonoscopy in May 2023 with finding of a tortuous colon, moderate internal hemorrhoids and also noted external hemorrhoids at that time.  She was being managed with antispasmodics/Bentyl and fiber supplements.  Patient comes in today being that she has had hemorrhoid symptoms in the past but never to the degree that she is having currently.  She was having hemorrhoidal symptoms over the past several months and had been using a suppository and cream, says she feels like she was not getting any better and that it got more painful to use a suppository and then about a week ago had a bowel movement and then immediately developed "excruciating" pain.  She says she took a shower shortly after that and felt something prolapsed from her rectum.  She been treating the pain with anti-inflammatory medication and a heating pad.  Ever since then it hurts to have a bowel movement to the point that is traumatized her and she has been eating much less for fear of having to go to the bathroom.  She is not having any rectal bleeding at this point, continues to have some mild lower abdominal discomfort off and on and nausea intermittently.  She does not feel that the Bentyl or Citrucel is working as well as it had but be perhaps because she has been having all of the hemorrhoid symptoms.  Review of Systems Pertinent positive and negative review of systems were noted in the above HPI section.  All other review of systems was otherwise negative.    Outpatient Encounter Medications as of 10/06/2022  Medication Sig   albuterol (VENTOLIN HFA) 108 (90 Base) MCG/ACT inhaler Inhale 2 puffs into the lungs every 6 (six) hours as needed for wheezing or shortness of breath.   AMBULATORY NON FORMULARY MEDICATION Medication Name: Diltiazem 2%/lidocaine 5% Apply 1/2 inches into anus 4 times a day until symptoms resolve   atomoxetine (STRATTERA) 60 MG capsule Take 60 mg by mouth every morning.   B-D 3CC LUER-LOK SYR 18GX1-1/2 18G X 1-1/2" 3 ML MISC See admin instructions.   dicyclomine (BENTYL) 20 MG tablet Take 1 tablet (20 mg total) by mouth 4 (four) times daily -  before meals and at bedtime.   Glucosamine HCl (GLUCOSAMINE PO) Take 1 tablet by mouth daily.   lamoTRIgine (LAMICTAL) 150 MG tablet Take 150 mg by mouth daily.   sertraline (ZOLOFT) 100 MG tablet Take 150 mg by mouth every morning.   SURE COMFORT INSULIN SYRINGE 31G X 5/16" 1 ML MISC See admin instructions.   testosterone cypionate (DEPOTESTOSTERONE CYPIONATE) 200 MG/ML injection SMARTSIG:0.2 Milliliter(s) SUB-Q Once a Week   cephALEXin (KEFLEX) 500 MG capsule Take 1 capsule (500 mg total) by mouth 2 (two) times daily. (Patient not taking: Reported on 10/06/2022)   levocetirizine (XYZAL) 5 MG tablet Take 1 tablet (5 mg total) by mouth every evening. (Patient not taking: Reported on 10/06/2022)   LUCIRA CHECK IT COVID-19 TEST KIT See admin instructions. (Patient not taking: Reported on 10/06/2022)  methylcellulose (CITRUCEL) oral powder Take 1 packet by mouth daily. (Patient not taking: Reported on 10/06/2022)   VITAMIN D PO Take 1 tablet by mouth daily. (Patient not taking: Reported on 10/06/2022)   [DISCONTINUED] clindamycin-benzoyl peroxide (BENZACLIN) gel Apply topically 2 (two) times daily.   [DISCONTINUED] methocarbamol (ROBAXIN) 500 MG tablet Take 1 tablet (500 mg total) by mouth every 8 (eight) hours as needed for muscle spasms.   [DISCONTINUED] naproxen (NAPROSYN) 500 MG tablet Take  1 tablet (500 mg total) by mouth 2 (two) times daily with a meal.   Facility-Administered Encounter Medications as of 10/06/2022  Medication   promethazine (PHENERGAN) injection 25 mg   No Known Allergies Patient Active Problem List   Diagnosis Date Noted   Severe episode of recurrent major depressive disorder, with psychotic features ([place])    Vitamin D deficiency 09/15/2020   Social History   Socioeconomic History   Marital status: Single    Spouse name: Not on file   Number of children: Not on file   Years of education: Not on file   Highest education level: Not on file  Occupational History   Not on file  Tobacco Use   Smoking status: Never   Smokeless tobacco: Never  Vaping Use   Vaping Use: Never used  Substance and Sexual Activity   Alcohol use: Not Currently    Alcohol/week: 4.0 standard drinks of alcohol    Types: 4 Shots of liquor per week    Comment: 4 shots/day   Drug use: Not Currently    Types: Marijuana    Comment: delta 8   Sexual activity: Yes  Other Topics Concern   Not on file  Social History Narrative   Not on file   Social Determinants of Health   Financial Resource Strain: Not on file  Food Insecurity: Not on file  Transportation Needs: Not on file  Physical Activity: Not on file  Stress: Not on file  Social Connections: Not on file  Intimate Partner Violence: Not on file      Buzby's family history is not on file.      Objective:    Vitals:   10/06/22 1001  BP: 122/88  Pulse: 88  SpO2: 98%    Physical Exam  Well-developed well-nourished young female in no acute distress.  Height, Weight, 118 BMI 19.0  accompanied by female  HEENT; nontraumatic normocephalic, EOMI, PE R LA, sclera anicteric. Oropharynx; examined today Neck; supple, no JVD Cardiovascular; regular rate and rhythm with S1-S2, no murmur rub or gallop Pulmonary; Clear bilaterally Abdomen; soft, nontender, nondistended, no palpable mass or hepatosplenomegaly,  bowel sounds are active Rectal; noninflamed external hemorrhoid she is exquisitely tender on digital exam with fissure palpable posteriorly at about the 6 o'clock position.,  Unable to do anoscopy secondary to discomfort, no evidence of prolapsed internal hemorrhoid, on digital exam I can appreciate a soft internal hemorrhoid Skin; benign exam, no jaundice rash or appreciable lesions Extremities; no clubbing cyanosis or edema skin warm and dry Neuro/Psych; alert and oriented x4, grossly nonfocal mood and affect appropriate        Assessment & Plan:   #57 22 year old nonbinary female with previously documented moderate internal hemorrhoids, and external hemorrhoids at colonoscopy May 2023 who comes in with complaints of internal and external hemorrhoidal symptoms over the past few months but abrupt significant worsening about a week ago after a bowel movement when she developed excruciating anal pain which has persisted.  She feels as if she had  a prolapsed hemorrhoid at that point. On exam there is no prolapsed hemorrhoid, she does have an external hemorrhoidal tag which is noninflamed and a posterior anal fissure  #2 IBS manifesting with lower abdominal pain, nausea #3 history of depression/bipolar  Plan; patient is encouraged to eat, push fluids, and start Colace 1-2 at bedtime for stool softener. We discussed soaking in a tub of hot water 15 to 20 minutes at a time 2-3 times daily if possible Start diltiazem 2%/lidocaine 5% apply 1/2 inch into the anus 4 times daily.  We discussed the long healing process for anal fissures and she is advised to expect this to take several weeks to resolve. She can continue Citrucel daily, and dicyclomine 20 mg 3-4 times daily as needed Will plan follow-up with Dr. Havery Moros in about 6 weeks. She may eventually benefit from hemorrhoidal banding.  Anelisse Jacobson Genia Harold PA-C 10/06/2022   Cc: Dorna Mai, MD

## 2022-10-06 NOTE — Patient Instructions (Addendum)
_______________________________________________________  If you are age 24 or older, your body mass index should be between 23-30. Your Body mass index is 19.05 kg/m. If this is out of the aforementioned range listed, please consider follow up with your Primary Care Provider.  If you are age 41 or younger, your body mass index should be between 19-25. Your Body mass index is 19.05 kg/m. If this is out of the aformentioned range listed, please consider follow up with your Primary Care Provider.   ________________________________________________________  The [facility] providers would like to encourage you to use [facility] to communicate with providers for non-urgent requests or questions.  Due to long hold times on the telephone, sending your provider a message by [facility] may be a faster and more efficient way to get a response.  Please allow 48 business hours for a response.  Please remember that this is for non-urgent requests.  _______________________________________________________  [facility]'s information is below: Address: [address] [place], [place] [address]  [phone]  for the Diltiazem 2% gel. Symptoms can take 6-8 weeks to resolve   *Please DO NOT go directly from our office to pick up this medication! Give the pharmacy 1 day to process the prescription as this is compounded and takes time to make.  You have been scheduled for an appointment with Dr. Havery Moros on 12-05-2022 at [place] . Please arrive 10 minutes early for your appointment.  Soak in tub of hot water for 15 to 20 minutes 2-3 times a day  Push fluids.  Continue Citrucel daily  It was a pleasure to see you today!  Thank you for trusting me with your gastrointestinal care!

## 2022-10-06 NOTE — Progress Notes (Signed)
Agree with assessment and plan as outlined.  

## 2022-10-10 ENCOUNTER — Other Ambulatory Visit: Payer: Self-pay

## 2022-10-12 ENCOUNTER — Other Ambulatory Visit (HOSPITAL_COMMUNITY): Payer: Self-pay

## 2022-10-18 ENCOUNTER — Other Ambulatory Visit (HOSPITAL_COMMUNITY): Payer: Self-pay

## 2022-10-24 ENCOUNTER — Other Ambulatory Visit (HOSPITAL_COMMUNITY): Payer: Self-pay

## 2022-10-30 IMAGING — CR DG CHEST 2V
2 series · 2 of 2 positions shown · non-contrast
Comparison: Chest radiograph 12/24/2019

CLINICAL DATA: Chest pain

EXAM:
CHEST - 2 VIEW

[w chest pa (1 of 2)]
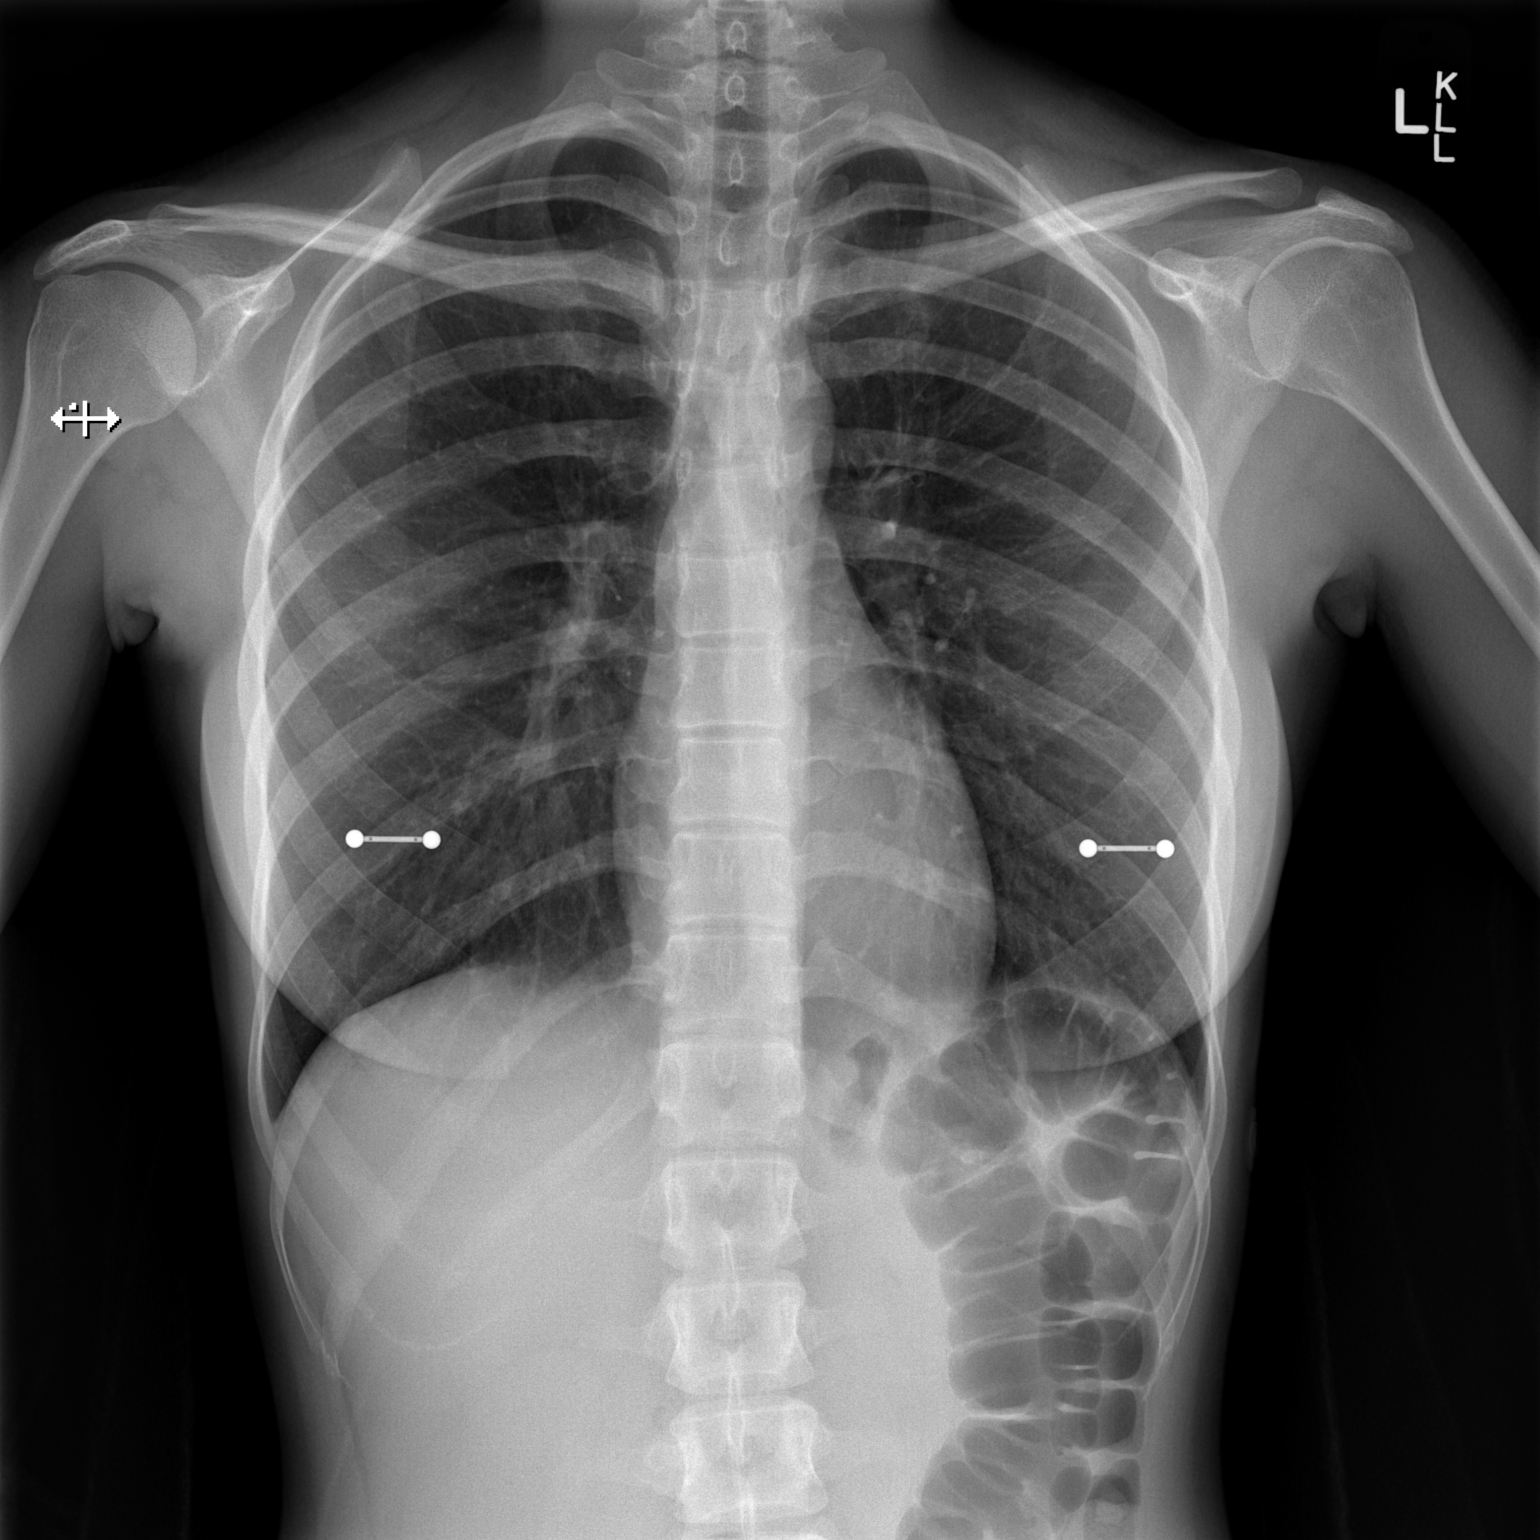

[w chest pa (2 of 2)]
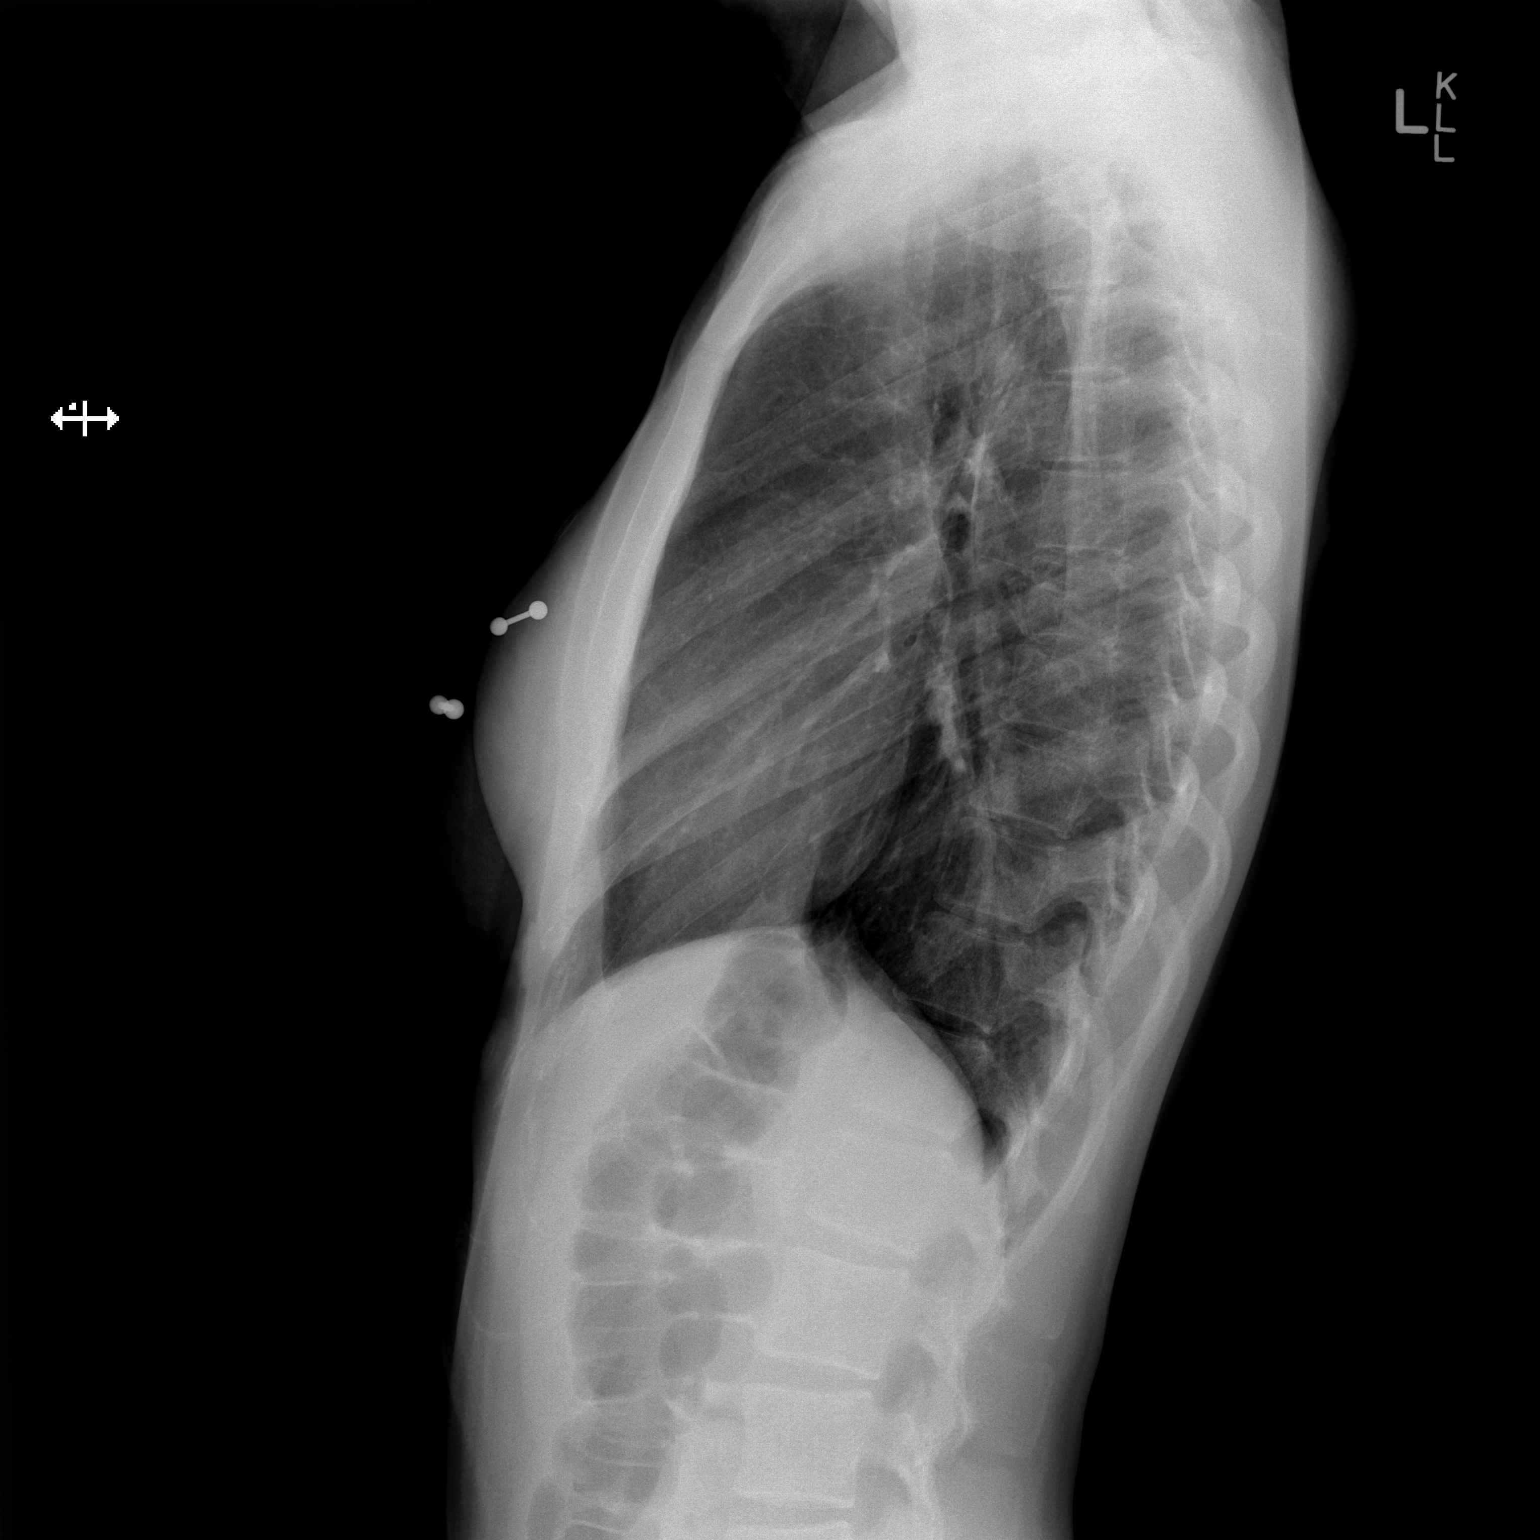

[2 of 2 positions shown; findings below may reference images not displayed]

FINDINGS: The cardiomediastinal contours are within normal limits. The lungs
are clear. No pneumothorax or pleural effusion. No acute finding in
the visualized skeleton.
IMPRESSION: No acute cardiopulmonary process.

## 2022-11-03 ENCOUNTER — Encounter: Payer: Self-pay | Admitting: Family Medicine

## 2022-11-03 ENCOUNTER — Ambulatory Visit (INDEPENDENT_AMBULATORY_CARE_PROVIDER_SITE_OTHER): Payer: Medicaid Other | Admitting: Family Medicine

## 2022-11-03 VITALS — BP 118/76 | HR 92 | Temp 98.1°F | Resp 16 | Wt 116.0 lb

## 2022-11-03 DIAGNOSIS — Z114 Encounter for screening for human immunodeficiency virus [HIV]: Secondary | ICD-10-CM | POA: Diagnosis not present

## 2022-11-03 DIAGNOSIS — R634 Abnormal weight loss: Secondary | ICD-10-CM

## 2022-11-03 DIAGNOSIS — R5383 Other fatigue: Secondary | ICD-10-CM | POA: Diagnosis not present

## 2022-11-03 DIAGNOSIS — M2550 Pain in unspecified joint: Secondary | ICD-10-CM

## 2022-11-03 DIAGNOSIS — Z1159 Encounter for screening for other viral diseases: Secondary | ICD-10-CM | POA: Diagnosis not present

## 2022-11-03 DIAGNOSIS — Z23 Encounter for immunization: Secondary | ICD-10-CM

## 2022-11-03 NOTE — Progress Notes (Unsigned)
Patient is here for their follow-up Patient has no concerns today she would lke to discuss having some lab work Care gaps have been discussed with patient

## 2022-11-04 LAB — CBC WITH DIFFERENTIAL/PLATELET
Basophils Absolute: 0.1 10*3/uL (ref 0.0–0.2)
Basos: 1 %
EOS (ABSOLUTE): 0.2 10*3/uL (ref 0.0–0.4)
Eos: 3 %
Hematocrit: 42.5 % (ref 34.0–46.6)
Hemoglobin: 14 g/dL (ref 11.1–15.9)
Immature Grans (Abs): 0 10*3/uL (ref 0.0–0.1)
Immature Granulocytes: 0 %
Lymphocytes Absolute: 2.1 10*3/uL (ref 0.7–3.1)
Lymphs: 29 %
MCH: 30 pg (ref 26.6–33.0)
MCHC: 32.9 g/dL (ref 31.5–35.7)
MCV: 91 fL (ref 79–97)
Monocytes Absolute: 0.6 10*3/uL (ref 0.1–0.9)
Monocytes: 8 %
Neutrophils Absolute: 4.3 10*3/uL (ref 1.4–7.0)
Neutrophils: 59 %
Platelets: 338 10*3/uL (ref 150–450)
RBC: 4.66 x10E6/uL (ref 3.77–5.28)
RDW: 11.4 % — ABNORMAL LOW (ref 11.7–15.4)
WBC: 7.3 10*3/uL (ref 3.4–10.8)

## 2022-11-04 LAB — BASIC METABOLIC PANEL WITH GFR
BUN/Creatinine Ratio: 14 (ref 9–23)
BUN: 12 mg/dL (ref 6–20)
CO2: 18 mmol/L — ABNORMAL LOW (ref 20–29)
Calcium: 9.8 mg/dL (ref 8.7–10.2)
Chloride: 103 mmol/L (ref 96–106)
Creatinine, Ser: 0.84 mg/dL (ref 0.57–1.00)
Glucose: 73 mg/dL (ref 70–99)
Potassium: 4.7 mmol/L (ref 3.5–5.2)
Sodium: 140 mmol/L (ref 134–144)
eGFR: 100 mL/min/1.73 (ref >59)

## 2022-11-04 LAB — TSH: TSH: 1.76 u[IU]/mL (ref 0.450–4.500)

## 2022-11-04 LAB — T4, FREE: Free T4: 1.32 ng/dL (ref 0.82–1.77)

## 2022-11-04 LAB — HEPATITIS C ANTIBODY: Hep C Virus Ab: NONREACTIVE

## 2022-11-04 LAB — HIV ANTIBODY (ROUTINE TESTING W REFLEX): HIV Screen 4th Generation wRfx: NONREACTIVE

## 2022-11-06 ENCOUNTER — Encounter: Payer: Self-pay | Admitting: Family Medicine

## 2022-11-06 NOTE — Progress Notes (Signed)
Established Patient Office Visit  Subjective    Patient ID: Adriana Jackson, adult    DOB: Nov 21, 2000  Age: 21 y.o. MRN: [id number]  CC: No chief complaint on file.   HPI Adriana Jackson presents with several constitutional complaints including fatigue and joint pain. This has been occurring for several months.    Outpatient Encounter Medications as of 11/03/2022  Medication Sig   atomoxetine (STRATTERA) 40 MG capsule Take 40 mg by mouth every morning.   atomoxetine (STRATTERA) 80 MG capsule Take 80 mg by mouth every morning.   BD SYRINGE SLIP TIP 25G X 5/8" 1 ML MISC See admin instructions.   SUMAtriptan (IMITREX) 100 MG tablet PLEASE SEE ATTACHED FOR DETAILED DIRECTIONS   albuterol (VENTOLIN HFA) 108 (90 Base) MCG/ACT inhaler Inhale 2 puffs into the lungs every 6 (six) hours as needed for wheezing or shortness of breath.   AMBULATORY NON FORMULARY MEDICATION Medication Name: Diltiazem 2%/lidocaine 5% Apply 1/2 inches into anus 4 times a day until symptoms resolve   atomoxetine (STRATTERA) 60 MG capsule Take 60 mg by mouth every morning.   B-D 3CC LUER-LOK SYR 18GX1-1/2 18G X 1-1/2" 3 ML MISC See admin instructions.   cephALEXin (KEFLEX) 500 MG capsule Take 1 capsule (500 mg total) by mouth 2 (two) times daily. (Patient not taking: Reported on 10/06/2022)   dicyclomine (BENTYL) 20 MG tablet Take 1 tablet (20 mg total) by mouth 4 (four) times daily -  before meals and at bedtime.   Glucosamine HCl (GLUCOSAMINE PO) Take 1 tablet by mouth daily.   lamoTRIgine (LAMICTAL) 150 MG tablet Take 150 mg by mouth daily.   levocetirizine (XYZAL) 5 MG tablet Take 1 tablet (5 mg total) by mouth every evening. (Patient not taking: Reported on 10/06/2022)   Enetai COVID-19 TEST KIT See admin instructions. (Patient not taking: Reported on 10/06/2022)   methylcellulose (CITRUCEL) oral powder Take 1 packet by mouth daily. (Patient not taking: Reported on 10/06/2022)   sertraline (ZOLOFT) 100 MG tablet Take  150 mg by mouth every morning.   SURE COMFORT INSULIN SYRINGE 31G X 5/16" 1 ML MISC See admin instructions.   testosterone cypionate (DEPOTESTOSTERONE CYPIONATE) 200 MG/ML injection SMARTSIG:0.2 Milliliter(s) SUB-Q Once a Week   VITAMIN D PO Take 1 tablet by mouth daily. (Patient not taking: Reported on 10/06/2022)   Facility-Administered Encounter Medications as of 11/03/2022  Medication   promethazine (PHENERGAN) injection 25 mg    Past Medical History:  Diagnosis Date   Asthma    Bipolar 1 disorder ([place])     No past surgical history on file.  Family History  Problem Relation Age of Onset   Colon cancer Neg Hx    Esophageal cancer Neg Hx     Social History   Socioeconomic History   Marital status: Single    Spouse name: Not on file   Number of children: Not on file   Years of education: Not on file   Highest education level: Not on file  Occupational History   Not on file  Tobacco Use   Smoking status: Never   Smokeless tobacco: Never  Vaping Use   Vaping Use: Never used  Substance and Sexual Activity   Alcohol use: Not Currently    Alcohol/week: 4.0 standard drinks of alcohol    Types: 4 Shots of liquor per week    Comment: 4 shots/day   Drug use: Not Currently    Types: Marijuana    Comment: delta 8  Sexual activity: Yes  Other Topics Concern   Not on file  Social History Narrative   Not on file   Social Determinants of Health   Financial Resource Strain: Not on file  Food Insecurity: Not on file  Transportation Needs: Not on file  Physical Activity: Not on file  Stress: Not on file  Social Connections: Not on file  Intimate Partner Violence: Not on file    Review of Systems  Constitutional:  Positive for malaise/fatigue and weight loss. Negative for chills and fever.  Musculoskeletal:  Positive for joint pain.  All other systems reviewed and are negative.       Objective    BP 118/76   Pulse 92   Temp 98.1 F (36.7 C) (Oral)   Resp 16    Wt 116 lb (52.6 kg)   SpO2 98%   BMI 18.72 kg/m   Physical Exam Vitals and nursing note reviewed.  Constitutional:      General: Adriana Ouch "[name]" is not in acute distress.    Appearance: Adriana Sivley "[name]" is underweight. Adriana Stanco "[name]" is not ill-appearing.  Cardiovascular:     Rate and Rhythm: Normal rate and regular rhythm.  Pulmonary:     Effort: Pulmonary effort is normal.     Breath sounds: Normal breath sounds.  Abdominal:     Palpations: Abdomen is soft.     Tenderness: There is no abdominal tenderness.  Neurological:     General: No focal deficit present.     Mental Status: Adriana Pry "[name]" is alert and oriented to person, place, and time.         Assessment & Plan:   1. Loss of weight Discussed dietary and activity options. Recommend daily multivit as well as supplementation with Ensure.   2. Other fatigue Monitoring labs ordered.  - CBC with Differential - TSH - T4, Free - Basic Metabolic Panel  3. Arthralgia, unspecified joint Labs as above. Tyle nol/nsaids prn  4. Screening for HIV (human immunodeficiency virus)  - HIV Antibody (routine testing w rflx)  5. Need for hepatitis C screening test  - HIV Antibody (routine testing w rflx) - Hepatitis C antibody  6. Need for immunization against influenza  - Flu Vaccine QUAD 33moIM (Fluarix, Fluzone & Alfiuria Quad PF)    No follow-ups on file.   WBecky Sax MD

## 2022-11-25 IMAGING — CT CT ABD-PELV W/ CM
2 of 4 series · 17 of 46 positions shown, 19 images · IV contrast (omnipaque)
Comparison: CT renal stone dated August 29, 2020

CLINICAL DATA: Right lower quadrant pain

EXAM:
CT ABDOMEN AND PELVIS WITH CONTRAST
TECHNIQUE: Multidetector CT imaging of the abdomen and pelvis was performed
using the standard protocol following bolus administration of
intravenous contrast.
CONTRAST:  75mL OMNIPAQUE IOHEXOL 300 MG/ML  SOLN

[Series 3: abdomen 5.0 · axial · 0.67mm/px · z∈[+961,+1326]mm · 14 of 83 slices shown, 16 images]
[im 5/83  soft-tissue]
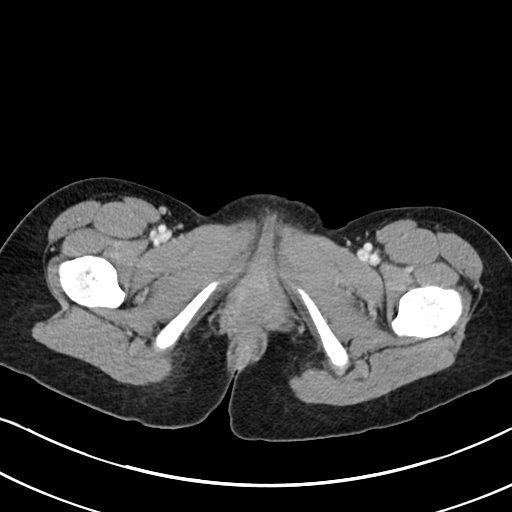
[im 5/83  bone]
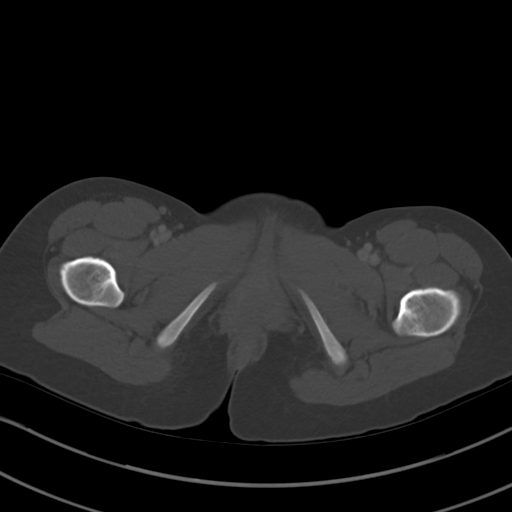
[im 9/83  soft-tissue]
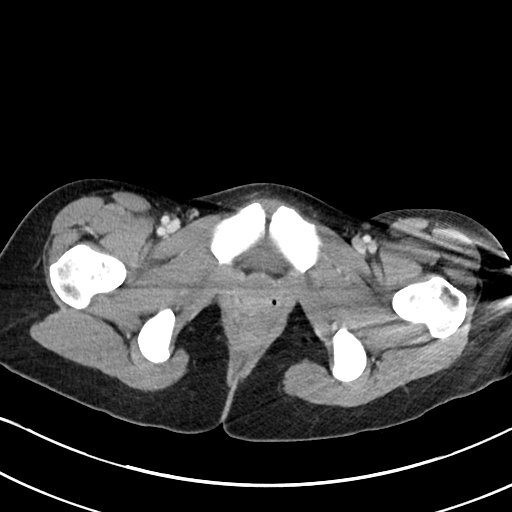
[im 18/83  soft-tissue]
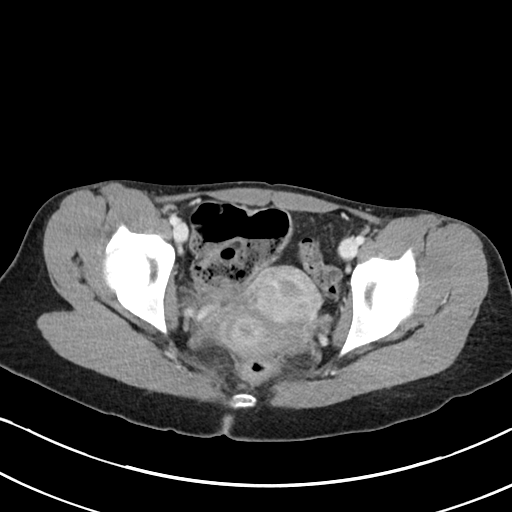
[im 22/83  soft-tissue]
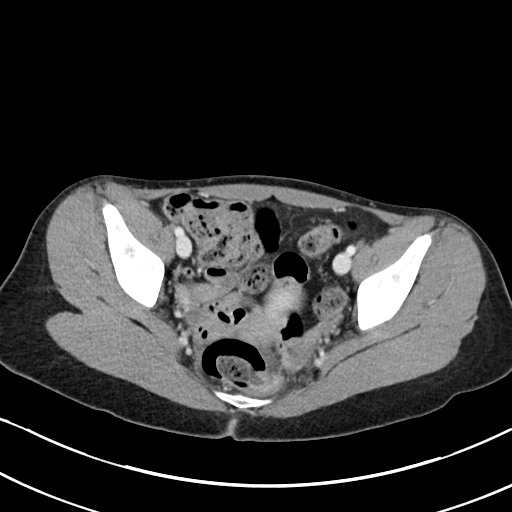
[im 26/83  soft-tissue]
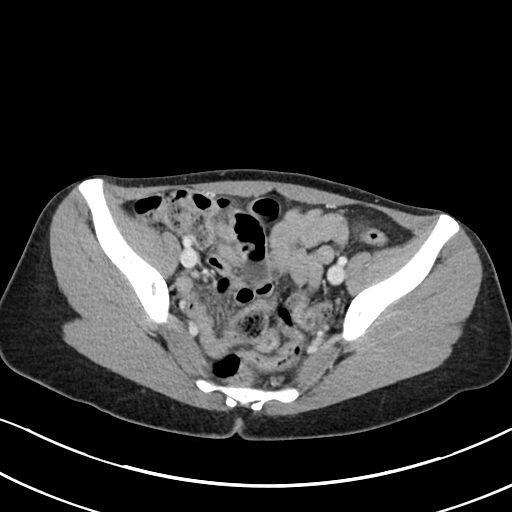
[im 35/83  soft-tissue]
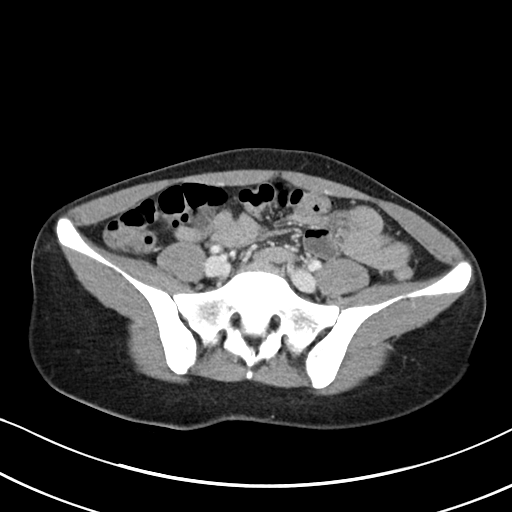
[im 39/83  soft-tissue]
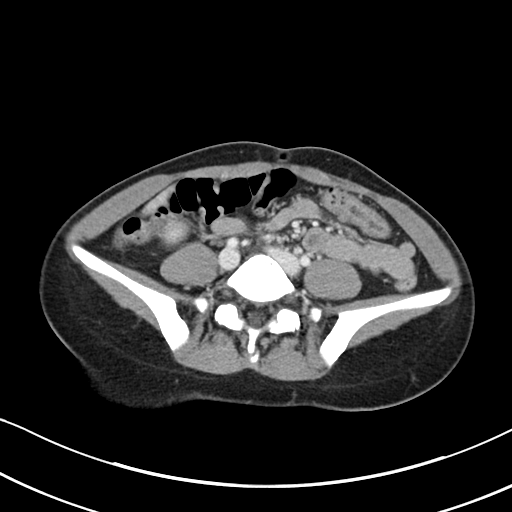
[im 44/83  soft-tissue]
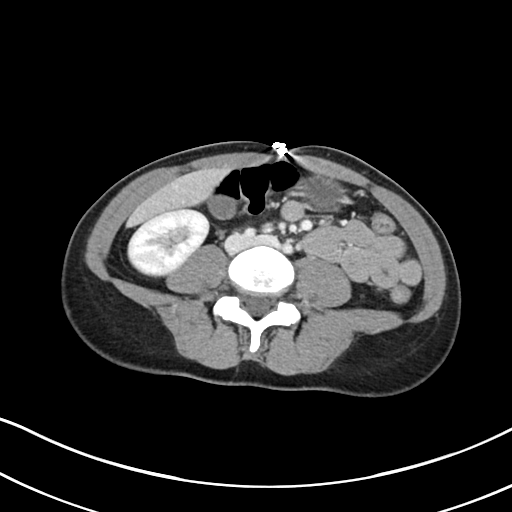
[im 48/83  soft-tissue]
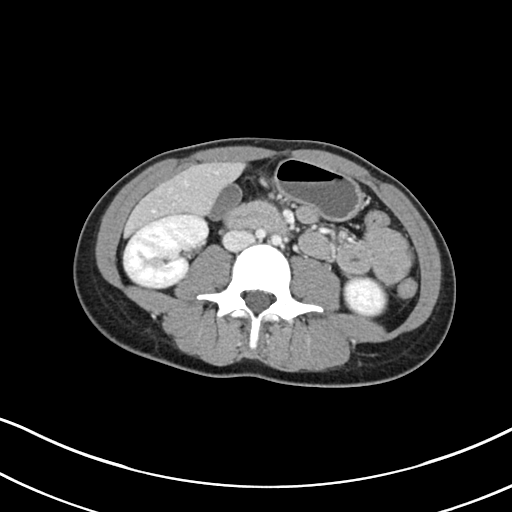
[im 48/83  bone]
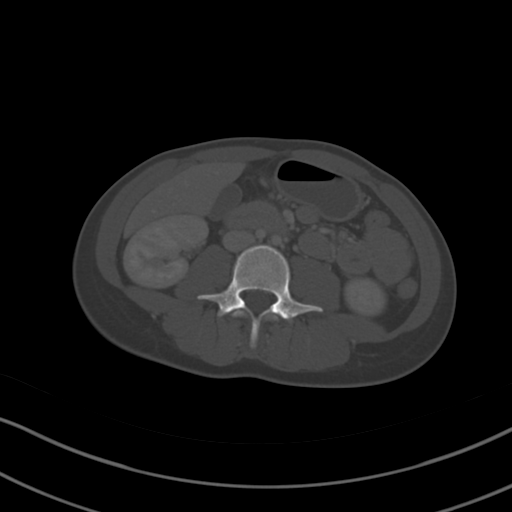
[im 57/83  soft-tissue]
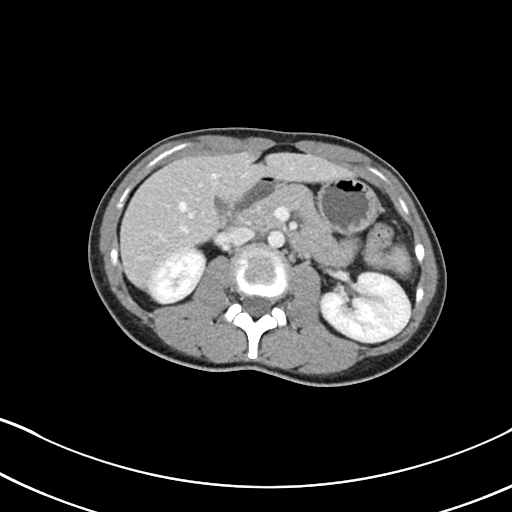
[im 61/83  soft-tissue]
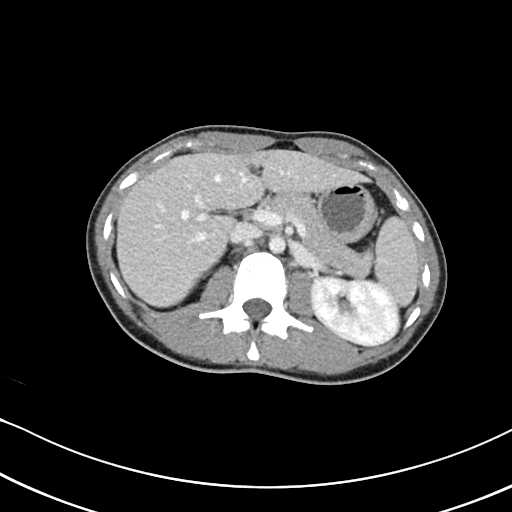
[im 65/83  soft-tissue]
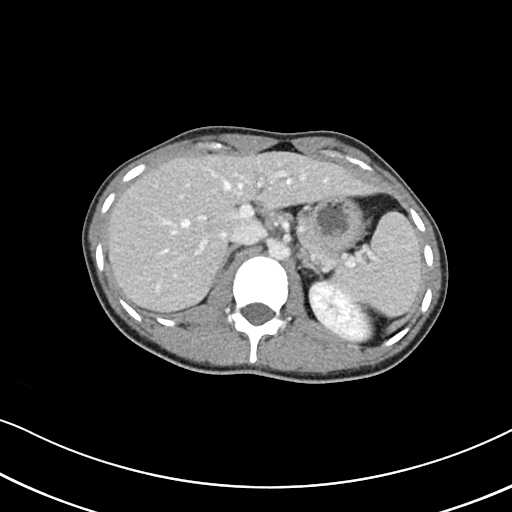
[im 74/83  soft-tissue]
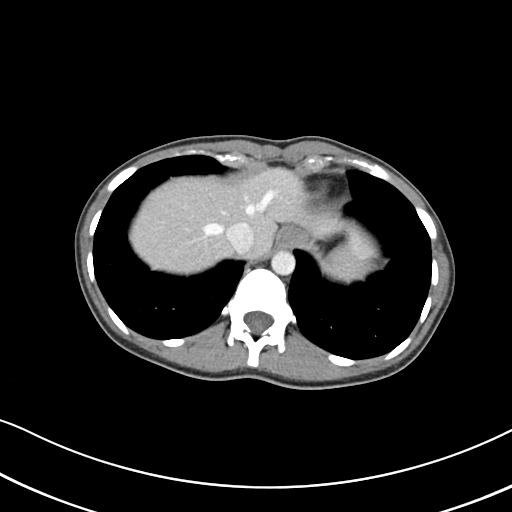
[im 78/83  soft-tissue]
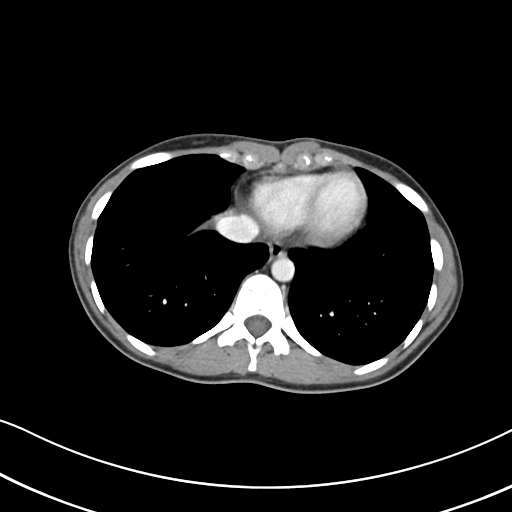

[Series 6: abdomen 3.0 mpr cor · coronal · 0.80mm/px · 3 of 73 slices shown]
[im 25/73  soft-tissue]
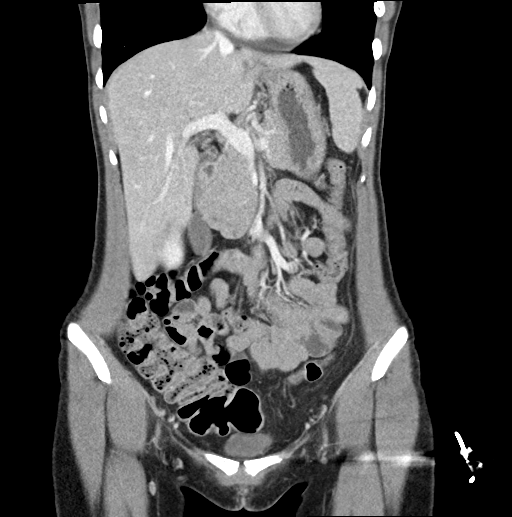
[im 33/73  soft-tissue]
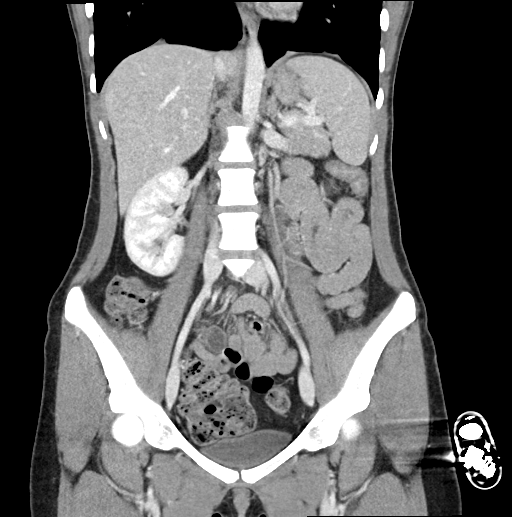
[im 41/73  soft-tissue]
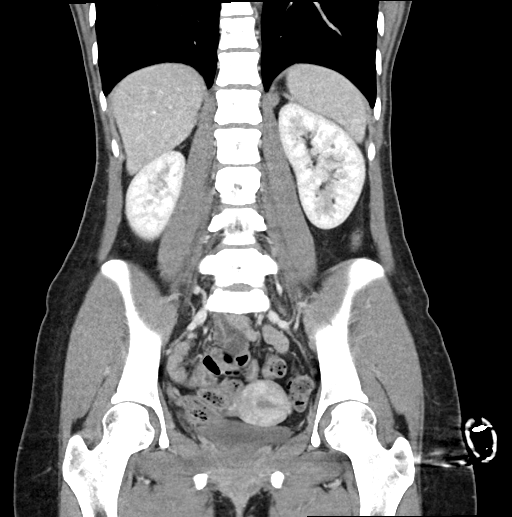

[17 of 46 positions shown; findings below may reference images not displayed]

FINDINGS: Lower chest: No acute abnormality.

Hepatobiliary: No focal liver abnormality is seen. No gallstones,
gallbladder wall thickening, or biliary dilatation.

Pancreas: Unremarkable. No pancreatic ductal dilatation or
surrounding inflammatory changes.

Spleen: Normal in size without focal abnormality.

Adrenals/Urinary Tract: Adrenal glands are unremarkable. Kidneys are
normal, without renal calculi, focal lesion, or hydronephrosis.
Bladder is unremarkable.

Stomach/Bowel: Stomach is within normal limits. Appendix appears
normal. No evidence of bowel wall thickening, distention, or
inflammatory changes.

Vascular/Lymphatic: No significant vascular findings are present. No
enlarged abdominal or pelvic lymph nodes.

Reproductive: Uterus and bilateral adnexa are unremarkable.

Other: No abdominal wall hernia or abnormality. No abdominopelvic
ascites.

Musculoskeletal: No acute or significant osseous findings.
IMPRESSION: No acute findings in the abdomen or pelvis. Normal appearing
appendix.

## 2022-12-05 ENCOUNTER — Encounter: Payer: Self-pay | Admitting: Gastroenterology

## 2022-12-05 ENCOUNTER — Ambulatory Visit (INDEPENDENT_AMBULATORY_CARE_PROVIDER_SITE_OTHER): Payer: Medicaid Other | Admitting: Gastroenterology

## 2022-12-05 VITALS — BP 100/68 | HR 117 | Ht 66.0 in | Wt 115.0 lb

## 2022-12-05 DIAGNOSIS — K602 Anal fissure, unspecified: Secondary | ICD-10-CM | POA: Diagnosis not present

## 2022-12-05 MED ORDER — LIDOCAINE (ANORECTAL) 5 % EX CREA: TOPICAL_CREAM | CUTANEOUS | 0 refills | Status: DC

## 2022-12-05 MED ORDER — AMBULATORY NON FORMULARY MEDICATION: 2 refills | Status: DC

## 2022-12-05 NOTE — Patient Instructions (Addendum)
Stay on daily fiber supplement.  STOP diltiazem.  We have sent a prescription for nitroglycerin 0.125% gel to [facility]. You should apply a pea size amount to your rectum three times daily x 6-8 weeks.  [facility]'s information is below: Address: [address], [place], [place] [address]  [phone]  *Please DO NOT go directly from our office to pick up this medication! Give the pharmacy 1 day to process the prescription as this is compounded and takes time to make.   _________________________________________________________________  We have given you samples of the following medication to take: Recticare with 5% Lidocaine - use as needed for anal pain  Please call with an update in 2 weeks if you are not better.  Thank you for entrusting me with your care and for choosing [facility], Dr. Kendall Cellar    If your blood pressure at your visit was 140/90 or greater, please contact your primary care physician to follow up on this.  _______________________________________________________  If you are age 49 or older, your body mass index should be between 23-30. Your Body mass index is 18.56 kg/m. If this is out of the aforementioned range listed, please consider follow up with your Primary Care Provider.  If you are age 29 or younger, your body mass index should be between 19-25. Your Body mass index is 18.56 kg/m. If this is out of the aformentioned range listed, please consider follow up with your Primary Care Provider.   ________________________________________________________  The [facility] providers would like to encourage you to use [facility] to communicate with providers for non-urgent requests or questions.  Due to long hold times on the telephone, sending your provider a message by [facility] may be a faster and more efficient way to get a response.  Please allow 48 business hours for a response.  Please remember that this is for  non-urgent requests.  _______________________________________________________  Due to recent changes in healthcare laws, you may see the results of your imaging and laboratory studies on MyChart before your provider has had a chance to review them.  We understand that in some cases there may be results that are confusing or concerning to you. Not all laboratory results come back in the same time frame and the provider may be waiting for multiple results in order to interpret others.  Please give [place] 48 hours in order for your provider to thoroughly review all the results before contacting the office for clarification of your results.

## 2022-12-05 NOTE — Progress Notes (Signed)
HPI :  22 year old female who have seen for a variety of issues in the past.  Specifically here today for rectal pain.  She was last seen in January by Nicoletta Ba for this issue.  Noted to have a posterior midline anal fissure at that time.  She was started on diltiazem/lidocaine ointment applied 3 times daily as well as fiber for her bowels.  She states she is really not had too much improvement.  She states she has been using the diltiazem ointment anywhere from once to 3 times daily, more so to 3 times daily and states that it helps transiently but has not provided much significant benefit.  She is having a bowel movement usually once daily, no straining, no hard stools.  She denies any purulent discharge or fevers.  Not really having any blood in her stools.  She describes the pain as sharp and spasms when she uses the bathroom that can persist up to a milder degree throughout the day until she needs to go again.  See prior clinic notes for full details of her history.  Recall for her symptoms to date she has had labs which have showed no anemia or inflammatory changes, negative for celiac testing, EGD and colonoscopy without concerning findings, CT scan abdomen pelvis in the past unremarkable.  She had internal hemorrhoids noted as the likely cause for her bleeding previously..     CT abdomen / pelvis with contrast 09/19/21 - IMPRESSION: No acute findings in the abdomen or pelvis. Normal appearing appendix.   CT renal stone study 08/29/20: IMPRESSION: No acute abnormality noted.   EGD 02/08/22: - The exam of the esophagus was otherwise normal. - There was extrinsic compression of the fundus from another organ. The entire examined stomach was otherwise entirely normal. Biopsies were taken with a cold forceps for Helicobacter pylori testing. Review of prior CT scan shows no concerning findings in the abdomen. - The duodenal bulb and second portion of the duodenum were normal. Biopsies  for histology were taken with a cold forceps for evaluation of celiac disease.   Colonoscopy 02/08/22: Hemorrhoids were found on perianal exam. - The terminal ileum appeared normal. - The colon was tortuous. This exam was performed using Ultraslim pediatric colonoscope given the patient's BMI - Internal hemorrhoids were found during retroflexion. The hemorrhoids were moderate. - The exam was otherwise without abnormality.   1. Surgical [P], duodenum and stomach - DUODENAL MUCOSA WITH NORMAL VILLOUS ARCHITECTURE. - NO VILLOUS ATROPHY OR INCREASED INTRAEPITHELIAL LYMPHOCYTES. 2. Surgical [P], gastric antrum and gastric body - UNREMARKABLE ANTRAL AND OXYNTIC MUCOSA. - NO HELICOBACTER PYLORI IDENTIFIED.     Past Medical History:  Diagnosis Date   Anal fissure    Asthma    Bipolar 1 disorder ([facility])      History reviewed. No pertinent surgical history. Family History  Problem Relation Age of Onset   Colon cancer Neg Hx    Esophageal cancer Neg Hx    Social History   Tobacco Use   Smoking status: Never   Smokeless tobacco: Never  Vaping Use   Vaping Use: Never used  Substance Use Topics   Alcohol use: Not Currently    Alcohol/week: 4.0 standard drinks of alcohol    Types: 4 Shots of liquor per week    Comment: 4 shots/day   Drug use: Not Currently    Types: Marijuana    Comment: delta 8   Current Outpatient Medications  Medication Sig Dispense Refill   albuterol (  VENTOLIN HFA) 108 (90 Base) MCG/ACT inhaler Inhale 2 puffs into the lungs every 6 (six) hours as needed for wheezing or shortness of breath. 8 g 0   AMBULATORY NON FORMULARY MEDICATION Medication Name: Nitroglycerine ointment 0.125 %  Apply a pea sized amount internally three times daily for 6 weeks 30 g 2   atomoxetine (STRATTERA) 80 MG capsule Take 80 mg by mouth every morning.     B-D 3CC LUER-LOK SYR 18GX1-1/2 18G X 1-1/2" 3 ML MISC See admin instructions.     BD SYRINGE SLIP TIP 25G X 5/8" 1 ML MISC See  admin instructions.     dicyclomine (BENTYL) 20 MG tablet Take 1 tablet (20 mg total) by mouth 4 (four) times daily -  before meals and at bedtime. 120 tablet 0   Glucosamine HCl (GLUCOSAMINE PO) Take 1 tablet by mouth daily.     lamoTRIgine (LAMICTAL) 150 MG tablet Take 150 mg by mouth daily.     levocetirizine (XYZAL) 5 MG tablet Take 1 tablet (5 mg total) by mouth every evening. 30 tablet 0   Lidocaine, Anorectal, (RECTICARE) 5 % CREA Use rectally as needed 6 g 0   LUCIRA CHECK IT COVID-19 TEST KIT See admin instructions.     methylcellulose (CITRUCEL) oral powder Take 1 packet by mouth daily.     sertraline (ZOLOFT) 100 MG tablet Take 150 mg by mouth every morning.     SUMAtriptan (IMITREX) 100 MG tablet PLEASE SEE ATTACHED FOR DETAILED DIRECTIONS     SURE COMFORT INSULIN SYRINGE 31G X 5/16" 1 ML MISC See admin instructions.     testosterone cypionate (DEPOTESTOSTERONE CYPIONATE) 200 MG/ML injection SMARTSIG:0.2 Milliliter(s) SUB-Q Once a Week     VITAMIN D PO Take 1 tablet by mouth daily.     Current Facility-Administered Medications  Medication Dose Route Frequency Provider Last Rate Last Admin   promethazine (PHENERGAN) injection 25 mg  25 mg Intramuscular Q6H PRN Dorna Mai, MD   25 mg at 07/12/22 1419   No Known Allergies   Review of Systems: All systems reviewed and negative except where noted in HPI.    No results found.  Physical Exam: BP 100/68   Pulse (!) 117   Ht '5\' 6"'$  (1.676 m)   Wt 115 lb (52.2 kg)   LMP  (LMP Unknown)   BMI 18.56 kg/m  Constitutional: Pleasant,well-developed, female in no acute distress. DRE - anal fissure 6 o clock position, full DRE not done due to pain. No fistula or purulence, no abscess. Tia Alert CMA as standby Extremities: no edema Neurological: Alert and oriented to person place and time. Psychiatric: Normal mood and affect. Behavior is normal.   ASSESSMENT: 22 y.o. adult here for assessment of the following  1. Anal fissure     Persistent rectal pain secondary to anal fissure noted on exam today.  She states she has been compliant with diltiazem/lidocaine ointment however no significant improvement it seems subjectively since her last visit.  She is on fiber, having regular soft bowel movements and not straining.  Discussed options with her.  I do not see any evidence of perianal abscess/fistula or other concerning pathology.  Colonoscopy relatively recently without Crohn's disease.  Recommend she should stay on her fiber supplement, Citrucel to keep still soft.  I will switch her to 0.125% nitroglycerin ointment to use 3 times daily every day.  Also she can use RectiCare as needed for pain.  Call if no better after the next 4 to  6 weeks or so.  If fissure persist may need to consider having her see colorectal surgery.  She agrees  PLAN: - continue fiber supplement - Citrucel daily - keep stools soft - switch to 0.125% nitroglycerin ointment - pea sized amount three times daily for 6 weeks - if no better call - sample of Recticare - can use PRN for discomoft - call in 4-6 weeks if no better, may consider surgical evaluation  Jolly Mango, MD [facility]

## 2022-12-29 ENCOUNTER — Ambulatory Visit: Payer: Medicaid Other | Admitting: Physician Assistant

## 2023-01-19 ENCOUNTER — Ambulatory Visit (INDEPENDENT_AMBULATORY_CARE_PROVIDER_SITE_OTHER): Payer: Medicaid Other | Admitting: Physician Assistant

## 2023-01-19 ENCOUNTER — Encounter: Payer: Self-pay | Admitting: Physician Assistant

## 2023-01-19 VITALS — BP 114/78 | HR 91 | Temp 98.0°F | Ht 66.0 in | Wt 118.0 lb

## 2023-01-19 DIAGNOSIS — G8929 Other chronic pain: Secondary | ICD-10-CM

## 2023-01-19 DIAGNOSIS — M25562 Pain in left knee: Secondary | ICD-10-CM

## 2023-01-19 DIAGNOSIS — M25561 Pain in right knee: Secondary | ICD-10-CM

## 2023-01-19 DIAGNOSIS — R5383 Other fatigue: Secondary | ICD-10-CM | POA: Diagnosis not present

## 2023-01-19 DIAGNOSIS — M25531 Pain in right wrist: Secondary | ICD-10-CM | POA: Diagnosis not present

## 2023-01-19 DIAGNOSIS — M25532 Pain in left wrist: Secondary | ICD-10-CM

## 2023-01-19 DIAGNOSIS — E049 Nontoxic goiter, unspecified: Secondary | ICD-10-CM

## 2023-01-19 LAB — VITAMIN D 25 HYDROXY (VIT D DEFICIENCY, FRACTURES): VITD: 33.01 ng/mL (ref 30.00–100.00)

## 2023-01-19 LAB — VITAMIN B12: Vitamin B-12: 527 pg/mL (ref 211–911)

## 2023-01-19 NOTE — Progress Notes (Signed)
Subjective:    Patient ID: Adriana Jackson, adult    DOB: 10/18/2000, 22 y.o.   MRN: [id number]  Chief Complaint  Patient presents with   New Patient (Initial Visit)    New pt in office to establish care with PCP; pt last seen PCP in January; pt due for CPE and labs; pt c/o constant fatigue and joint pain for past few years; pt experiences vertigo often;     HPI 21 y.o. patient presents today for new patient establishment with me.  Patient was previously established with Dr. Georganna Skeans. Major in psychology - about to graduate from [employer]. Living with parents currently.   Current Care Team: Psychiatrist - [facility]  GI - Dr. Adela Lank  [facility] - testosterone supplements, once monthly   Acute Concerns: Fatigue -no sleep issues, averages 7-8 hours  -low energy, feels tired often -vertigo symptoms, can't ride on elevators  Joint pain -all over, mostly knees and wrists; going on for the last few years -7/10 average daily pain, usually worse in the evenings  -Used to take pain medication  -worse with season changes  -some swelling occasionally in knees  -stiffness after sitting too long   Takes a multivitamin daily   No marijuana use per patient, not in years  Social drinking, not daily or consistent  Likes to walk for exercise   Well-rounded meals per patient   Chronic Concerns: See PMH listed below, as well as A/P for details on issues we specifically discussed during today's visit.      Past Medical History:  Diagnosis Date   Anal fissure    Anxiety 03/30/2021   Asthma    Bipolar 1 disorder ([facility])    Depression 03/30/2021    Past Surgical History:  Procedure Laterality Date   COLONOSCOPY      Family History  Problem Relation Age of Onset   Asthma Mother    Irritable bowel syndrome Mother    Fibromyalgia Mother    Osteoporosis Mother    Hypothyroidism Mother    Rheum arthritis Father    Hypertension Father    Multiple sclerosis Sister     Colon cancer Neg Hx    Esophageal cancer Neg Hx     Social History   Tobacco Use   Smoking status: Never   Smokeless tobacco: Never  Vaping Use   Vaping Use: Never used  Substance Use Topics   Alcohol use: Not Currently    Comment: 4 shots/day   Drug use: Never    Types: Marijuana    Comment: delta 8     No Known Allergies  Review of Systems NEGATIVE UNLESS OTHERWISE INDICATED IN HPI      Objective:     BP 114/78 (BP Location: Left Arm)   Pulse 91   Temp 98 F (36.7 C) (Temporal)   Ht 5\' 6"  (1.676 m)   Wt 118 lb (53.5 kg)   LMP 09/25/2022 (Approximate)   SpO2 97%   BMI 19.05 kg/m   Wt Readings from Last 3 Encounters:  01/19/23 118 lb (53.5 kg)  12/05/22 115 lb (52.2 kg)  11/03/22 116 lb (52.6 kg)    BP Readings from Last 3 Encounters:  01/19/23 114/78  12/05/22 100/68  11/03/22 118/76     Physical Exam Vitals and nursing note reviewed.  Constitutional:      General: Adriana Volner "[name]" is not in acute distress.    Appearance: Normal appearance. Adriana Orourke "[name]" is not toxic-appearing.  HENT:  Head: Normocephalic and atraumatic.     Right Ear: External ear normal.     Left Ear: External ear normal.  Eyes:     Extraocular Movements: Extraocular movements intact.     Conjunctiva/sclera: Conjunctivae normal.     Pupils: Pupils are equal, round, and reactive to light.  Neck:     Thyroid: Thyromegaly (diffuse slight enlargement) present. No thyroid mass or thyroid tenderness.  Cardiovascular:     Rate and Rhythm: Normal rate and regular rhythm.     Pulses: Normal pulses.     Heart sounds: Normal heart sounds.  Pulmonary:     Effort: Pulmonary effort is normal.     Breath sounds: Normal breath sounds.  Musculoskeletal:        General: No tenderness or deformity. Normal range of motion.     Cervical back: Normal range of motion and neck supple.     Right lower leg: No edema.     Left lower leg: No edema.  Skin:    General: Skin is warm and  dry.     Findings: No lesion or rash.  Neurological:     General: No focal deficit present.     Mental Status: Adriana Rafanan "[name]" is alert and oriented to person, place, and time.  Psychiatric:        Mood and Affect: Mood normal. Affect is flat.        Behavior: Behavior normal.        Assessment & Plan:  Other fatigue -     Iron, TIBC and Ferritin Panel -     VITAMIN D 25 Hydroxy (Vit-D Deficiency, Fractures) -     Vitamin B12 -     Lyme Disease Serology w/Reflex -     ANA, IFA Comprehensive Panel -     Rheumatoid factor  Pain of both wrist joints -     Lyme Disease Serology w/Reflex -     ANA, IFA Comprehensive Panel -     Rheumatoid factor  Chronic pain of both knees -     Lyme Disease Serology w/Reflex -     ANA, IFA Comprehensive Panel -     Rheumatoid factor  Thyroid enlarged -     Thyroid Panel With TSH -     US THYROID; Future   Pleasant new pt establishing care Chronic issues with joint pain and fatigue Father with hx RA, mom with fibromyalgia Pt's mental health doing well at this time they report Will check labs today, treat pending abnormal Check thyroid US F/up from there     Return if symptoms worsen or fail to improve.    Phala Schraeder M Tawan Corkern, PA-C

## 2023-01-19 NOTE — Patient Instructions (Signed)
Welcome to [facility]! It was a pleasure meeting you today.  Follow up pending lab results. Please call sooner if any concerns.   PLEASE NOTE:  If you had any LAB tests please let us know if you have not heard back within a few days. You may see your results on MyChart before we have a chance to review them but we will give you a call once they are reviewed by [place]. If we ordered any REFERRALS today, please let us know if you have not heard from their office within the next two weeks. Let us know through MyChart if you are needing REFILLS, or have your pharmacy send [place] the request. You can also use MyChart to communicate with me or any office staff.  Please try these tips to maintain a healthy lifestyle:  Eat most of your calories during the day when you are active. Eliminate processed foods including packaged sweets (pies, cakes, cookies), reduce intake of potatoes, white bread, white pasta, and white rice. Look for whole grain options, oat flour or almond flour.  Each meal should contain half fruits/vegetables, one quarter protein, and one quarter carbs (no bigger than a computer mouse).  Cut down on sweet beverages. This includes juice, soda, and sweet tea. Also watch fruit intake, though this is a healthier sweet option, it still contains natural sugar! Limit to 3 servings daily.  Drink at least 1 glass of water with each meal and aim for at least 8 glasses (64 ounces) per day.  Exercise at least 150 minutes every week to the best of your ability.    Take Care,  Ludivina Guymon, PA-C

## 2023-01-21 LAB — IRON,TIBC AND FERRITIN PANEL
%SAT: 29 % (calc) (ref 16–45)
Ferritin: 24 ng/mL (ref 16–154)
Iron: 91 ug/dL (ref 40–190)
TIBC: 311 mcg/dL (calc) (ref 250–450)

## 2023-01-21 LAB — ANA, IFA COMPREHENSIVE PANEL
Anti Nuclear Antibody (ANA): NEGATIVE
ENA SM Ab Ser-aCnc: <1 AI
SM/RNP: <1 AI
SSA (Ro) (ENA) Antibody, IgG: <1 AI
SSB (La) (ENA) Antibody, IgG: <1 AI
Scleroderma (Scl-70) (ENA) Antibody, IgG: <1 AI
ds DNA Ab: <1 IU/mL

## 2023-01-21 LAB — THYROID PANEL WITH TSH
Free Thyroxine Index: 1.5 (ref 1.4–3.8)
T3 Uptake: 30 % (ref 22–35)
T4, Total: 5.1 ug/dL (ref 5.1–11.9)
TSH: 0.98 mIU/L

## 2023-01-21 LAB — RHEUMATOID FACTOR: Rheumatoid fact SerPl-aCnc: <10 IU/mL (ref <14)

## 2023-01-22 LAB — LYME DISEASE SEROLOGY W/REFLEX: Lyme Total Antibody EIA: NEGATIVE

## 2023-01-31 ENCOUNTER — Telehealth: Payer: Medicaid Other | Admitting: Nurse Practitioner

## 2023-01-31 DIAGNOSIS — J029 Acute pharyngitis, unspecified: Secondary | ICD-10-CM | POA: Diagnosis not present

## 2023-01-31 DIAGNOSIS — J069 Acute upper respiratory infection, unspecified: Secondary | ICD-10-CM

## 2023-01-31 MED ORDER — IPRATROPIUM BROMIDE 0.03 % NA SOLN
2.0000 | Freq: Two times a day (BID) | NASAL | 12 refills | Status: DC
Start: 2023-01-31 — End: 2024-04-17

## 2023-01-31 NOTE — Progress Notes (Signed)
E-Visit for Sore Throat  We are sorry that you are not feeling well.  Here is how we plan to help!  Your symptoms indicate a likely viral infection (Pharyngitis).   Pharyngitis is inflammation in the back of the throat which can cause a sore throat, scratchiness and sometimes difficulty swallowing.   Pharyngitis is typically caused by a respiratory virus and will just run its course.  Please keep in mind that your symptoms could last up to 10 days.  For throat pain, we recommend over the counter oral pain relief medications such as acetaminophen or aspirin, or anti-inflammatory medications such as ibuprofen or naproxen sodium.  Topical treatments such as oral throat lozenges or sprays may be used as needed.  Avoid close contact with loved ones, especially the very young and elderly.  Remember to wash your hands thoroughly throughout the day as this is the number one way to prevent the spread of infection and wipe down door knobs and counters with disinfectant.  Be sure you are taking your daily allergy medicine  Warm salt water gargles are very helpful in loosening any mucous in the back of your throat from post nasal drainage  A nasal spray may also be helpful and we have sent one to your pharmacy  Meds ordered this encounter  Medications   ipratropium (ATROVENT) 0.03 % nasal spray    Sig: Place 2 sprays into both nostrils every 12 (twelve) hours.    Dispense:  30 mL    Refill:  12     After careful review of your answers, I would not recommend an antibiotic for your condition.  Antibiotics should not be used to treat conditions that we suspect are caused by viruses like the virus that causes the common cold or flu. However, some people can have Strep with atypical symptoms. You may need formal testing in clinic or office to confirm if your symptoms continue or worsen.  Providers prescribe antibiotics to treat infections caused by bacteria. Antibiotics are very powerful in treating bacterial  infections when they are used properly.  To maintain their effectiveness, they should be used only when necessary.  Overuse of antibiotics has resulted in the development of super bugs that are resistant to treatment!    Home Care: Only take medications as instructed by your medical team. Do not drink alcohol while taking these medications. A steam or ultrasonic humidifier can help congestion.  You can place a towel over your head and breathe in the steam from hot water coming from a faucet. Avoid close contacts especially the very young and the elderly. Cover your mouth when you cough or sneeze. Always remember to wash your hands.  Get Help Right Away If: You develop worsening fever or throat pain. You develop a severe head ache or visual changes. Your symptoms persist after you have completed your treatment plan.  Make sure you Understand these instructions. Will watch your condition. Will get help right away if you are not doing well or get worse.   Thank you for choosing an e-visit.  Your e-visit answers were reviewed by a board certified advanced clinical practitioner to complete your personal care plan. Depending upon the condition, your plan could have included both over the counter or prescription medications.  Please review your pharmacy choice. Make sure the pharmacy is open so you can pick up prescription now. If there is a problem, you may contact your provider through [employer] and have the prescription routed to another pharmacy.  Your safety is important to [place]. If you have drug allergies check your prescription carefully.   For the next 24 hours you can use MyChart to ask questions about today's visit, request a non-urgent call back, or ask for a work or school excuse. You will get an email in the next two days asking about your experience. I hope that your e-visit has been valuable and will speed your recovery.   I spent approximately 5 minutes reviewing the  patient's history, current symptoms and coordinating their care today.

## 2023-02-02 ENCOUNTER — Encounter: Payer: Self-pay | Admitting: Physician Assistant

## 2023-02-02 ENCOUNTER — Other Ambulatory Visit: Payer: Self-pay | Admitting: Physician Assistant

## 2023-02-02 DIAGNOSIS — M797 Fibromyalgia: Secondary | ICD-10-CM

## 2023-02-02 NOTE — Telephone Encounter (Signed)
Not showing any referrals placed for this patient in regards to this message, and last OV note also doesn't state any referrals, please advise

## 2023-02-07 ENCOUNTER — Encounter: Payer: Self-pay | Admitting: Physical Medicine and Rehabilitation

## 2023-02-12 ENCOUNTER — Ambulatory Visit
Admission: RE | Admit: 2023-02-12 | Discharge: 2023-02-12 | Disposition: A | Discharge status: Home / Self Care | Payer: Medicaid Other | Source: Ambulatory Visit | Attending: Physician Assistant | Admitting: Physician Assistant

## 2023-02-12 DIAGNOSIS — E049 Nontoxic goiter, unspecified: Secondary | ICD-10-CM

## 2023-02-22 ENCOUNTER — Telehealth: Payer: Medicaid Other | Admitting: Family Medicine

## 2023-02-22 DIAGNOSIS — K047 Periapical abscess without sinus: Secondary | ICD-10-CM | POA: Diagnosis not present

## 2023-02-22 MED ORDER — AMOXICILLIN 500 MG PO CAPS
500.0000 mg | ORAL_CAPSULE | Freq: Three times a day (TID) | ORAL | 0 refills | Status: AC
Start: 2023-02-22 — End: 2023-03-04

## 2023-02-22 NOTE — Progress Notes (Signed)

## 2023-04-05 ENCOUNTER — Encounter: Payer: Medicaid Other | Admitting: Physical Medicine and Rehabilitation

## 2023-04-16 ENCOUNTER — Encounter: Payer: Self-pay | Admitting: Physician Assistant

## 2023-04-17 NOTE — Telephone Encounter (Signed)
See note

## 2023-04-18 ENCOUNTER — Other Ambulatory Visit: Payer: Self-pay

## 2023-04-18 DIAGNOSIS — T7840XS Allergy, unspecified, sequela: Secondary | ICD-10-CM

## 2023-05-08 ENCOUNTER — Encounter: Payer: MEDICAID | Attending: Physical Medicine and Rehabilitation | Admitting: Physical Medicine and Rehabilitation

## 2023-05-08 VITALS — BP 94/66 | HR 71 | Ht 67.0 in | Wt 126.0 lb

## 2023-05-08 DIAGNOSIS — R5383 Other fatigue: Secondary | ICD-10-CM | POA: Diagnosis present

## 2023-05-08 DIAGNOSIS — M25561 Pain in right knee: Secondary | ICD-10-CM | POA: Insufficient documentation

## 2023-05-08 DIAGNOSIS — M25562 Pain in left knee: Secondary | ICD-10-CM | POA: Insufficient documentation

## 2023-05-08 MED ORDER — DULOXETINE HCL 20 MG PO CPEP: 20.0000 mg | ORAL_CAPSULE | Freq: Every day | ORAL | 3 refills | Status: DC

## 2023-05-08 NOTE — Patient Instructions (Addendum)
-  Discussed following foods that may reduce pain: 1) Ginger (especially studied for arthritis)- reduce leukotriene production to decrease inflammation 2) Blueberries- high in phytonutrients that decrease inflammation 3) Salmon- marine omega-3s reduce joint swelling and pain 4) Pumpkin seeds- reduce inflammation 5) dark chocolate- reduces inflammation 6) turmeric- reduces inflammation 7) tart cherries - reduce pain and stiffness 8) extra virgin olive oil - its compound olecanthal helps to block prostaglandins  9) chili peppers- can be eaten or applied topically via capsaicin 10) mint- helpful for headache, muscle aches, joint pain, and itching 11) garlic- reduces inflammation  Link to further information on diet for chronic pain: [email or web address]

## 2023-05-08 NOTE — Progress Notes (Signed)
Subjective:    Patient ID: Adriana Jackson, adult    DOB: 04/17/2001, 22 y.o.   MRN: [id number]  HPI  Adriana Jackson is a 22 year old woman who presents to establish care for diffuse joint pain.  1) Diffuse joint pain: -knees, ankles, and wrists are affected -she is interested in medications for her pain -she did exercises and these helped with her knees -has never tried a tens unit  2) Fatigue:  -fatigue has been present since the onset of her pain.   Pain Inventory Average Pain 7 Pain Right Now 8 My pain is constant and aching  In the last 24 hours, has pain interfered with the following? General activity 6 Relation with others 7 Enjoyment of life 9 What TIME of day is your pain at its worst? night Sleep (in general) Fair  Pain is worse with: bending, sitting, and inactivity Pain improves with: rest and medication Relief from Meds: 5  walk without assistance how many minutes can you walk? 60 ability to climb steps?  yes do you drive?  no  not employed: date last employed .  spasms dizziness  New pt  New pt    Family History  Problem Relation Age of Onset   Asthma Mother    Irritable bowel syndrome Mother    Fibromyalgia Mother    Osteoporosis Mother    Hypothyroidism Mother    Rheum arthritis Father    Hypertension Father    Multiple sclerosis Sister    Colon cancer Neg Hx    Esophageal cancer Neg Hx    Social History   Socioeconomic History   Marital status: Single    Spouse name: Not on file   Number of children: Not on file   Years of education: Not on file   Highest education level: Not on file  Occupational History   Not on file  Tobacco Use   Smoking status: Never   Smokeless tobacco: Never  Vaping Use   Vaping status: Never Used  Substance and Sexual Activity   Alcohol use: Not Currently    Comment: 4 shots/day   Drug use: Never    Types: Marijuana    Comment: delta 8   Sexual activity: Yes    Birth control/protection: Implant   Other Topics Concern   Not on file  Social History Narrative   Not on file   Social Determinants of Health   Financial Resource Strain: Not on file  Food Insecurity: Not on file  Transportation Needs: Not on file  Physical Activity: Not on file  Stress: Not on file  Social Connections: Not on file   Past Surgical History:  Procedure Laterality Date   COLONOSCOPY     Past Medical History:  Diagnosis Date   Anal fissure    Anxiety 03/30/2021   Asthma    Bipolar 1 disorder (HCC)    Depression 03/30/2021   BP 94/66   Pulse 71   Ht 5\' 7"  (1.702 m)   Wt 126 lb (57.2 kg)   SpO2 98%   BMI 19.73 kg/m   Opioid Risk Score:   Fall Risk Score:  `1  Depression screen [facility] 2/9     05/08/2023   11:23 AM 01/19/2023   10:05 AM 11/03/2022    8:32 AM 07/12/2022    1:30 PM 04/06/2022    8:59 AM 12/07/2021    1:15 PM 03/30/2021    2:50 AM  Depression screen PHQ 2/9  Decreased Interest 0 1 1  1 0 1 3  Down, Depressed, Hopeless 0 0 1 1 0 1 3  PHQ - 2 Score 0 1 2 2  0 2 6  Altered sleeping 2 0 1 1 0 2 3  Tired, decreased energy 3 2 1 1 2 2 3   Change in appetite 1 0 1 1 0 2 3  Feeling bad or failure about yourself  0 0 0 0 0 1 3  Trouble concentrating 1 0 1 1 0 3 2  Moving slowly or fidgety/restless 0 0 0 0 0 0 2  Suicidal thoughts 0 0 0 0 0 0 2  PHQ-9 Score 7 3 6 6 2 12 24   Difficult doing work/chores Not difficult at all Not difficult at all Not difficult at all Not difficult at all Not difficult at all  Very difficult     Review of Systems  Constitutional:  Positive for appetite change.  Gastrointestinal:  Positive for nausea.  Musculoskeletal:        Bilateral wrist pain Bilateral knee pain Bilateral ankle pain  Neurological:  Positive for dizziness and weakness.  All other systems reviewed and are negative.      Objective:   Physical Exam Gen: no distress, normal appearing HEENT: oral mucosa pink and moist, NCAT Cardio: Reg rate Chest: normal effort, normal rate of  breathing Abd: soft, non-distended Ext: no edema Psych: pleasant, normal affect Skin: intact Neuro: Alert and oriented x3      Assessment & Plan:   1) Chronic joint pain secondary to fibromyalgia: -Discussed current symptoms of pain and history of pain.   -cymbalta 20mg  daily ordered  Prescribed Zynex Nexwave  -Discussed benefits of exercise in reducing pain. -Discussed following foods that may reduce pain: 1) Ginger (especially studied for arthritis)- reduce leukotriene production to decrease inflammation 2) Blueberries- high in phytonutrients that decrease inflammation 3) Salmon- marine omega-3s reduce joint swelling and pain 4) Pumpkin seeds- reduce inflammation 5) dark chocolate- reduces inflammation 6) turmeric- reduces inflammation 7) tart cherries - reduce pain and stiffness 8) extra virgin olive oil - its compound olecanthal helps to block prostaglandins  9) chili peppers- can be eaten or applied topically via capsaicin 10) mint- helpful for headache, muscle aches, joint pain, and itching 11) garlic- reduces inflammation  Link to further information on diet for chronic pain: [email or web address]   2) Fatigue: -vitamin D level ordered for today

## 2023-05-24 ENCOUNTER — Ambulatory Visit: Payer: MEDICAID | Admitting: Physical Therapy

## 2023-05-31 ENCOUNTER — Other Ambulatory Visit: Payer: Self-pay | Admitting: Oncology

## 2023-05-31 DIAGNOSIS — Z006 Encounter for examination for normal comparison and control in clinical research program: Secondary | ICD-10-CM

## 2023-06-01 ENCOUNTER — Encounter: Payer: Self-pay | Admitting: Internal Medicine

## 2023-06-01 ENCOUNTER — Other Ambulatory Visit: Payer: Self-pay

## 2023-06-01 ENCOUNTER — Ambulatory Visit (INDEPENDENT_AMBULATORY_CARE_PROVIDER_SITE_OTHER): Payer: MEDICAID | Admitting: Internal Medicine

## 2023-06-01 VITALS — BP 100/70 | HR 99 | Temp 100.0°F | Ht 67.5 in | Wt 122.0 lb

## 2023-06-01 DIAGNOSIS — J302 Other seasonal allergic rhinitis: Secondary | ICD-10-CM | POA: Diagnosis not present

## 2023-06-01 DIAGNOSIS — T781XXA Other adverse food reactions, not elsewhere classified, initial encounter: Secondary | ICD-10-CM

## 2023-06-01 DIAGNOSIS — J4520 Mild intermittent asthma, uncomplicated: Secondary | ICD-10-CM | POA: Diagnosis not present

## 2023-06-01 DIAGNOSIS — H1045 Other chronic allergic conjunctivitis: Secondary | ICD-10-CM

## 2023-06-01 DIAGNOSIS — J3089 Other allergic rhinitis: Secondary | ICD-10-CM | POA: Diagnosis not present

## 2023-06-01 MED ORDER — CROMOLYN SODIUM 4 % OP SOLN: 1.0000 [drp] | Freq: Four times a day (QID) | OPHTHALMIC | 12 refills | Status: DC

## 2023-06-01 MED ORDER — FLUTICASONE PROPIONATE 50 MCG/ACT NA SUSP: 1.0000 | Freq: Every day | NASAL | 2 refills | Status: AC

## 2023-06-01 MED ORDER — EPINEPHRINE 0.3 MG/0.3ML IJ SOAJ: 0.3000 mg | INTRAMUSCULAR | 1 refills | Status: AC | PRN

## 2023-06-01 MED ORDER — CETIRIZINE HCL 10 MG PO TABS: 10.0000 mg | ORAL_TABLET | Freq: Every day | ORAL | 6 refills | Status: DC

## 2023-06-01 NOTE — Patient Instructions (Signed)
Chronic Rhinitis Seasonal and Perennial Allergic: - allergy testing today: Positive to grass, weeds, trees, dust mite, cat, roach  - Prevention:  - allergen avoidance when possible Start allergy injections.  Let us know if you want to do Rush or standard buildup Had a detailed discussion with patient/family that clinical history is suggestive of allergic rhinitis, and may benefit from allergy immunotherapy (AIT). Discussed in detail regarding the dosing, schedule, side effects (mild to moderate local allergic reaction and rarely systemic allergic reactions including anaphylaxis/death), alternatives and benefits (significant improvement in nasal symptoms, seasonal flares of asthma) of immunotherapy with the patient. There is significant time commitment involved with allergy shots, which includes weekly immunotherapy injections for first 9-12 months and then biweekly to monthly injections for 3-5 years. Clinical response is often delayed and patient may not see an improvement for 6-12 months. Consent was signed. I have prescribed epinephrine injectable and demonstrated proper use. For mild symptoms you can take over the counter antihistamines such as Benadryl and monitor symptoms closely. If symptoms worsen or if you have severe symptoms including breathing issues, throat closure, significant swelling, whole body hives, severe diarrhea and vomiting, lightheadedness then inject epinephrine and seek immediate medical care afterwards. Action plan given.   - Symptom control: - Start Nasal Steroid Spray: Best results if used daily. - Options include Flonase (fluticasone), Nasocort (triamcinolone), Nasonex (mometasome) 1- 2 sprays in each nostril daily.  - All can be purchased over-the-counter if not covered by insurance. - Start Antihistamine: daily or daily as needed.   -Options include Zyrtec (Cetirizine) 10mg , Claritin (Loratadine) 10mg , Allegra (Fexofenadine) 180mg , or Xyzal (Levocetirinze) 5mg  - Can be  purchased over-the-counter if not covered by insurance.  Allergic Conjunctivitis:  - Start Allergy Eye drops-Cromolyn-1 drop each eye up to 4 times daily as needed and Rewetting Drops such as Systane,TheraTears, etc  -Avoid eye drops that say red eye relief as they may contain medications that dry out your eyes.    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 - These symptoms are not life-threatening and are because of a cross reaction between a pollen you are allergic to, and to a protein in specific foods (such as fresh fruits, vegetables, and nuts). - If you can eat these things it is fine to continue to do so.  If not, you may avoid these fresh fruits.  Heating these foods should allow them to be consumed without symptoms. - You may notice increase in symptoms during allergy season, this is to be expected. - Allergy  Immunotherapy can help lessen and some cases cure these symptoms and should be considered if they worsen.    Mild Intermittent Asthma: well  Controlled  - your lung testing today looked great!   PLAN:  - Rescue Inhaler: Albuterol (Proair/Ventolin) 2 puffs . Use  every 4-6 hours as needed for chest tightness, wheezing, or coughing.  Can also use 15 minutes prior to exercise if you have symptoms with activity. - Asthma is not controlled if:  - Symptoms are occurring >2 times a week OR  - >2 times a month nighttime awakenings  - You are requiring systemic steroids (prednisone/steroid injections) more than once per year  - Your require hospitalization for your asthma.  - Please call the clinic to schedule a follow up if these symptoms arise  Follow up: for RUSH or initial Ait appointment (call  us to let us know)  Follow up: 6 months after starting AIT   Thank you so much for letting me partake in your care today.  Don't hesitate to reach out if you  have any additional concerns!  Ferol Luz, MD  [facility] [place], [place]  Reducing Pollen Exposure  The American Academy of Allergy, Asthma and Immunology suggests the following steps to reduce your exposure to pollen during allergy seasons.    Do not hang sheets or clothing out to dry; pollen may collect on these items. Do not mow lawns or spend time around freshly cut grass; mowing stirs up pollen. Keep windows closed at night.  Keep car windows closed while driving. Minimize morning activities outdoors, a time when pollen counts are usually at their highest. Stay indoors as much as possible when pollen counts or humidity is high and on windy days when pollen tends to remain in the air longer. Use air conditioning when possible.  Many air conditioners have filters that trap the pollen spores. Use a HEPA room air filter to remove pollen form the indoor air you breathe.  DUST MITE AVOIDANCE MEASURES:  There are three main measures that need and can be taken to avoid house dust mites:  Reduce accumulation of dust in general -reduce furniture, clothing, carpeting, books, stuffed animals, especially in bedroom  Separate yourself from the dust -use pillow and mattress encasements (can be found at stores such as [employer] or online) -avoid direct exposure to air condition flow -use a HEPA filter device, especially in the bedroom; you can also use a HEPA filter vacuum cleaner -wipe dust with a moist towel instead of a dry towel or broom when cleaning  Decrease mites and/or their secretions -wash clothing and linen and stuffed animals at highest temperature possible, at least every 2 weeks -stuffed animals can also be placed in a bag and put in a freezer overnight  Despite the above measures, it is impossible to eliminate dust mites or their allergen completely from your home.  With the above measures the burden of mites in your home can be diminished,  with the goal of minimizing your allergic symptoms.  Success will be reached only when implementing and using all means together.  Control of Dog or Cat Allergen  Avoidance is the best way to manage a dog or cat allergy. If you have a dog or cat and are allergic to dog or cats, consider removing the dog or cat from the home. If you have a dog or cat but don't want to find it a new home, or if your family wants a pet even though someone in the household is allergic, here are some strategies that may help keep symptoms at bay:  Keep the pet out of your bedroom and restrict it to only a few rooms. Be advised that keeping the dog or cat in only one room will not limit the allergens to that room. Don't pet, hug or kiss the dog or cat; if you do, wash your hands with soap and water. High-efficiency particulate air (HEPA) cleaners run continuously in a bedroom or living room can reduce allergen levels over time. Regular use of a high-efficiency vacuum cleaner or a central vacuum can reduce allergen levels. Giving your dog or cat a bath at least once a week can reduce airborne allergen.  Control of Cockroach Allergen  Cockroach allergen has been identified as an important cause of acute attacks of asthma,  especially in urban settings.  There are fifty-five species of cockroach that exist in the [place], however only three, the [place], [place] species produce allergen that can affect patients with Asthma.  Allergens can be obtained from fecal particles, egg casings and secretions from cockroaches.    Remove food sources. Reduce access to water. Seal access and entry points. Spray runways with 0.5-1% Diazinon or Chlorpyrifos Blow boric acid power under stoves and refrigerator. Place bait stations (hydramethylnon) at feeding sites.

## 2023-06-01 NOTE — Progress Notes (Unsigned)
New Patient Note  RE: Adriana Jackson MRN: [id number] DOB: 27-Mar-2001 Date of Office Visit: 06/01/2023  Consult requested by: Allwardt, Crist Infante, PA-C Primary care provider: Allwardt, Crist Infante, PA-C  Chief Complaint: Allergic Reaction (States that fruits and vegetables give itch mouth runny nose when eaten raw.) and Asthma  History of Present Illness: I had the pleasure of seeing Adriana Jackson for initial evaluation at the [facility] on 06/01/2023. Adriana Jackson is a 22 y.o. adult, who is referred here by Allwardt, Crist Infante, PA-C for the evaluation of oral allergy syndrome, rhinitis and asthma .  History obtained from patient, chart review.  Asthma History:  -Diagnosed at age around age 41 .  -Current symptoms include chest tightness, cough, shortness of breath, and wheezing 1-2 times/day  daytime symptoms in past month, 0 nighttime awakenings in past month Using rescue inhaler 5- 6 times in past 30 days  -Limitations to daily activity: none - 1 ED visits, 0 UC visits and 1 oral steroids in the past year - 0 number of lifetime hospitalizations, 0 number of lifetime intubations.  - Identified Triggers:  rhinitis  and cold air - Up-to-date with pneumonia,, Covid-19,, and Flu, vaccines. - History of prior pneumonias: denies  - History of prior COVID-19/ RSV/FLUinfection:  covid (multiple times)  Previous Diagnostics:  - Prior PFTs or spirometry: none to review  - Most Recent AEC (11/03/22): 200 -Most Recent Chest Imaging: CXR on (08/14/21): The cardiomediastinal contours are within normal limits. The lungs are clear. No pneumothorax or pleural effusion. No acute finding in the visualized skeleton.  Management:  - Previously used therapies: albuterol  - Current regimen:  - Maintenance: none  - Rescue: Albuterol 2 puffs q4-6 hrs PRN, not using prior to exercise   When she eats raw fruits and vegetables she develops mouth itching and runny nose.  Specifically blueberry, banana,  apple, green vegetables. Tolerates processed or heated forms.   Chronic rhinitis: started as a young child  Symptoms include: nasal congestion, rhinorrhea, post nasal drainage, sneezing, watery eyes, itchy eyes, and itchy nose  Occurs year-round with seasonal flares in spring  Potential triggers: pollen, dust, cats Treatments tried: benadryl, claritin, xyzal (some benefit)  Previous allergy testing: no History of reflux/heartburn:  rarely  History of chronic sinusitis or sinus surgery: no Nonallergic triggers:  denies      Assessment and Plan: Adriana Jackson is a 22 y.o. adult with: Seasonal and perennial allergic rhinitis  Pollen-food allergy, initial encounter - Plan: Allergy Test, Interdermal Allergy Test  Other chronic allergic conjunctivitis of both eyes  Mild intermittent asthma in adult without complication   Plan: Patient Instructions  Chronic Rhinitis Seasonal and Perennial Allergic: - allergy testing today: Positive to grass, weeds, trees, dust mite, cat, roach  - Prevention:  - allergen avoidance when possible Start allergy injections.  Let us know if you want to do Rush or standard buildup Had a detailed discussion with patient/family that clinical history is suggestive of allergic rhinitis, and may benefit from allergy immunotherapy (AIT). Discussed in detail regarding the dosing, schedule, side effects (mild to moderate local allergic reaction and rarely systemic allergic reactions including anaphylaxis/death), alternatives and benefits (significant improvement in nasal symptoms, seasonal flares of asthma) of immunotherapy with the patient. There is significant time commitment involved with allergy shots, which includes weekly immunotherapy injections for first 9-12 months and then biweekly to monthly injections for 3-5 years. Clinical response is often delayed and patient may not see an improvement  for 6-12 months. Consent was signed. I have prescribed epinephrine injectable and  demonstrated proper use. For mild symptoms you can take over the counter antihistamines such as Benadryl and monitor symptoms closely. If symptoms worsen or if you have severe symptoms including breathing issues, throat closure, significant swelling, whole body hives, severe diarrhea and vomiting, lightheadedness then inject epinephrine and seek immediate medical care afterwards. Action plan given.   - Symptom control: - Start Nasal Steroid Spray: Best results if used daily. - Options include Flonase (fluticasone), Nasocort (triamcinolone), Nasonex (mometasome) 1- 2 sprays in each nostril daily.  - All can be purchased over-the-counter if not covered by insurance. - Start Antihistamine: daily or daily as needed.   -Options include Zyrtec (Cetirizine) 10mg , Claritin (Loratadine) 10mg , Allegra (Fexofenadine) 180mg , or Xyzal (Levocetirinze) 5mg  - Can be purchased over-the-counter if not covered by insurance.  Allergic Conjunctivitis:  - Start Allergy Eye drops-Cromolyn-1 drop each eye up to 4 times daily as needed and Rewetting Drops such as Systane,TheraTears, etc  -Avoid eye drops that say red eye relief as they may contain medications that dry out your eyes.    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 - These symptoms are not life-threatening and are because of a cross reaction between a pollen you are allergic to, and to a protein in specific foods (such as fresh fruits, vegetables, and nuts). - If you can eat these things it is fine to continue to do so.  If not, you may avoid these fresh fruits.  Heating these foods should allow them to be consumed without symptoms. - You may notice increase in symptoms during allergy season, this is to be expected. - Allergy  Immunotherapy can help lessen and some cases cure these symptoms and should be considered if they worsen.     Mild Intermittent Asthma: well  Controlled  - your lung testing today looked great!   PLAN:  - Rescue Inhaler: Albuterol (Proair/Ventolin) 2 puffs . Use  every 4-6 hours as needed for chest tightness, wheezing, or coughing.  Can also use 15 minutes prior to exercise if you have symptoms with activity. - Asthma is not controlled if:  - Symptoms are occurring >2 times a week OR  - >2 times a month nighttime awakenings  - You are requiring systemic steroids (prednisone/steroid injections) more than once per year  - Your require hospitalization for your asthma.  - Please call the clinic to schedule a follow up if these symptoms arise  Follow up: for RUSH or initial Ait appointment (call us to let us know)  Follow up: 6 months after starting AIT   Thank you so much for letting me partake in your care today.  Don't hesitate to reach out if you have any additional concerns!  Ferol Luz, MD  [facility] [place] [place]  Reducing Pollen Exposure  The American Academy of Allergy, Asthma and Immunology suggests the following steps to reduce your exposure to pollen during allergy seasons.    Do not hang sheets or clothing out to dry; pollen may collect on these items. Do not mow lawns or spend time around freshly cut grass; mowing stirs up pollen. Keep windows closed at night.  Keep car windows closed while driving. Minimize morning activities outdoors, a time when pollen counts are usually at their highest. Stay indoors as much as possible  when pollen counts or humidity is high and on windy days when pollen tends to remain in the air longer. Use air conditioning when possible.  Many air conditioners have filters that trap the pollen spores. Use a HEPA room air filter to remove pollen form the indoor air you breathe.  DUST MITE AVOIDANCE MEASURES:  There are three main measures that need and can be taken to avoid house dust mites:  Reduce accumulation of dust in  general -reduce furniture, clothing, carpeting, books, stuffed animals, especially in bedroom  Separate yourself from the dust -use pillow and mattress encasements (can be found at stores such as [employer] or online) -avoid direct exposure to air condition flow -use a HEPA filter device, especially in the bedroom; you can also use a HEPA filter vacuum cleaner -wipe dust with a moist towel instead of a dry towel or broom when cleaning  Decrease mites and/or their secretions -wash clothing and linen and stuffed animals at highest temperature possible, at least every 2 weeks -stuffed animals can also be placed in a bag and put in a freezer overnight  Despite the above measures, it is impossible to eliminate dust mites or their allergen completely from your home.  With the above measures the burden of mites in your home can be diminished, with the goal of minimizing your allergic symptoms.  Success will be reached only when implementing and using all means together.  Control of Dog or Cat Allergen  Avoidance is the best way to manage a dog or cat allergy. If you have a dog or cat and are allergic to dog or cats, consider removing the dog or cat from the home. If you have a dog or cat but don't want to find it a new home, or if your family wants a pet even though someone in the household is allergic, here are some strategies that may help keep symptoms at bay:  Keep the pet out of your bedroom and restrict it to only a few rooms. Be advised that keeping the dog or cat in only one room will not limit the allergens to that room. Don't pet, hug or kiss the dog or cat; if you do, wash your hands with soap and water. High-efficiency particulate air (HEPA) cleaners run continuously in a bedroom or living room can reduce allergen levels over time. Regular use of a high-efficiency vacuum cleaner or a central vacuum can reduce allergen levels. Giving your dog or cat a bath at least once a week  can reduce airborne allergen.  Control of Cockroach Allergen  Cockroach allergen has been identified as an important cause of acute attacks of asthma, especially in urban settings.  There are fifty-five species of cockroach that exist in the [place], however only three, the [place], [place] species produce allergen that can affect patients with Asthma.  Allergens can be obtained from fecal particles, egg casings and secretions from cockroaches.    Remove food sources. Reduce access to water. Seal access and entry points. Spray runways with 0.5-1% Diazinon or Chlorpyrifos Blow boric acid power under stoves and refrigerator. Place bait stations (hydramethylnon) at feeding sites.   Meds ordered this encounter  Medications   cromolyn (OPTICROM) 4 % ophthalmic solution    Sig: Place 1 drop into both eyes 4 (four) times daily.    Dispense:  10 mL    Refill:  12   fluticasone (FLONASE) 50 MCG/ACT nasal spray    Sig: Place 1  spray into both nostrils daily.    Dispense:  16 g    Refill:  2   cetirizine (ZYRTEC ALLERGY) 10 MG tablet    Sig: Take 1 tablet (10 mg total) by mouth daily.    Dispense:  30 tablet    Refill:  6   EPINEPHrine (EPIPEN 2-PAK) 0.3 mg/0.3 mL IJ SOAJ injection    Sig: Inject 0.3 mg into the muscle as needed for anaphylaxis.    Dispense:  1 each    Refill:  1   Lab Orders  No laboratory test(s) ordered today    Other allergy screening: Asthma: yes Rhino conjunctivitis: yes Food allergy:  [place] as above  Medication allergy: no Hymenoptera allergy: no Urticaria: no Eczema:no History of recurrent infections suggestive of immunodeficency: no  Diagnostics: Spirometry:  Tracings reviewed. Adriana Jackson's effort: Good reproducible efforts. FVC: 3.76L FEV1: 3.22L, 88% predicted FEV1/FVC ratio: 86% Interpretation: Spirometry consistent with normal pattern.  Please see scanned spirometry results for details.  Skin Testing: Environmental allergy  panel.  adequate controls  Results interpreted by myself and discussed with patient/family.  Airborne Adult Perc - 06/01/23 1000     Time Antigen Placed 1028    Allergen Manufacturer Greer    Location Back    Number of Test 55             Intradermal - 06/01/23 1100     Time Antigen Placed 1116    Allergen Manufacturer Greer    Location Arm    Number of Test 9             Past Medical History: Patient Active Problem List   Diagnosis Date Noted   Severe episode of recurrent major depressive disorder, with psychotic features (HCC)    Vitamin D deficiency 09/15/2020   Past Medical History:  Diagnosis Date   Anal fissure    Anxiety 03/30/2021   Asthma    Bipolar 1 disorder (HCC)    Depression 03/30/2021   Past Surgical History: Past Surgical History:  Procedure Laterality Date   COLONOSCOPY     Medication List:  Current Outpatient Medications  Medication Sig Dispense Refill   albuterol (VENTOLIN HFA) 108 (90 Base) MCG/ACT inhaler Inhale 2 puffs into the lungs every 6 (six) hours as needed for wheezing or shortness of breath. 8 g 0   atomoxetine (STRATTERA) 80 MG capsule Take 80 mg by mouth every morning.     B-D 3CC LUER-LOK SYR 18GX1-1/2 18G X 1-1/2" 3 ML MISC See admin instructions.     BD SYRINGE SLIP TIP 25G X 5/8" 1 ML MISC See admin instructions.     cetirizine (ZYRTEC ALLERGY) 10 MG tablet Take 1 tablet (10 mg total) by mouth daily. 30 tablet 6   cromolyn (OPTICROM) 4 % ophthalmic solution Place 1 drop into both eyes 4 (four) times daily. 10 mL 12   DULoxetine (CYMBALTA) 20 MG capsule Take 1 capsule (20 mg total) by mouth daily. 30 capsule 3   EPINEPHrine (EPIPEN 2-PAK) 0.3 mg/0.3 mL IJ SOAJ injection Inject 0.3 mg into the muscle as needed for anaphylaxis. 1 each 1   fluticasone (FLONASE) 50 MCG/ACT nasal spray Place 1 spray into both nostrils daily. 16 g 2   Glucosamine HCl (GLUCOSAMINE PO) Take 1 tablet by mouth daily.     lamoTRIgine (LAMICTAL) 150  MG tablet Take 150 mg by mouth daily.     levocetirizine (XYZAL) 5 MG tablet Take 1 tablet (5 mg total) by mouth every evening.  30 tablet 0   methylcellulose (CITRUCEL) oral powder Take 1 packet by mouth daily.     sertraline (ZOLOFT) 100 MG tablet Take 150 mg by mouth every morning.     SURE COMFORT INSULIN SYRINGE 31G X 5/16" 1 ML MISC See admin instructions.     testosterone cypionate (DEPOTESTOSTERONE CYPIONATE) 200 MG/ML injection SMARTSIG:0.2 Milliliter(s) SUB-Q Once a Week     VITAMIN D PO Take 1 tablet by mouth daily.     AMBULATORY NON FORMULARY MEDICATION Medication Name: Nitroglycerine ointment 0.125 %  Apply a pea sized amount internally three times daily for 6 weeks 30 g 2   dicyclomine (BENTYL) 20 MG tablet Take 1 tablet (20 mg total) by mouth 4 (four) times daily -  before meals and at bedtime. 120 tablet 0   ipratropium (ATROVENT) 0.03 % nasal spray Place 2 sprays into both nostrils every 12 (twelve) hours. 30 mL 12   Lidocaine, Anorectal, (RECTICARE) 5 % CREA Use rectally as needed 6 g 0   LUCIRA CHECK IT COVID-19 TEST KIT See admin instructions.     Current Facility-Administered Medications  Medication Dose Route Frequency Provider Last Rate Last Admin   promethazine (PHENERGAN) injection 25 mg  25 mg Intramuscular Q6H PRN Georganna Skeans, MD   25 mg at 07/12/22 1419   Allergies: No Known Allergies Social History: Social History   Socioeconomic History   Marital status: Single    Spouse name: Not on file   Number of children: Not on file   Years of education: Not on file   Highest education level: Not on file  Occupational History   Not on file  Tobacco Use   Smoking status: Never    Passive exposure: Past   Smokeless tobacco: Never  Vaping Use   Vaping status: Never Used  Substance and Sexual Activity   Alcohol use: Not Currently    Comment: 4 shots/day   Drug use: Not Currently    Types: Marijuana    Comment: delta 8   Sexual activity: Yes    Birth  control/protection: Implant  Other Topics Concern   Not on file  Social History Narrative   Not on file   Social Determinants of Health   Financial Resource Strain: Not on file  Food Insecurity: Not on file  Transportation Needs: Not on file  Physical Activity: Not on file  Stress: Not on file  Social Connections: Not on file   Lives in a apartment, no roaches in the house and bed is 2 feet off the floor, not exposed to fumes, chemicals or dust, HEPA filer in the home and home is not near an interstate or industrial area . Smoking: vape exposure inside the home Occupation: just graduated, currently unemployed   Environmental History: Immunologist in the house: yes Carpet in the family room: yes Carpet in the bedroom: yes Heating: electric Cooling: central Pet: yes cats with access to bedroom   Family History: Family History  Problem Relation Age of Onset   Asthma Mother    Irritable bowel syndrome Mother    Fibromyalgia Mother    Osteoporosis Mother    Hypothyroidism Mother    Allergic rhinitis Father    Rheum arthritis Father    Hypertension Father    Multiple sclerosis Sister    Colon cancer Neg Hx    Esophageal cancer Neg Hx      ROS: All others negative except as noted per HPI.   Objective: BP 100/70   Pulse  99   Temp 100 F (37.8 C)   Ht 5' 7.5" (1.715 m)   Wt 122 lb (55.3 kg)   SpO2 97%   BMI 18.83 kg/m  Body mass index is 18.83 kg/m.  General Appearance:  Alert, cooperative, no distress, appears stated age  Head:  Normocephalic, without obvious abnormality, atraumatic  Eyes:  Conjunctiva clear, EOM's intact  Nose: Nares normal,  erythematous nasal mucosa , hypertrophic turbinates, no visible anterior polyps, and septum midline  Throat: Lips, tongue normal; teeth and gums normal, + cobblestoning  Neck: Supple, symmetrical  Lungs:   clear to auscultation bilaterally, Respirations unlabored, no coughing  Heart:  regular rate and rhythm and  no murmur, Appears well perfused  Extremities: No edema  Skin: Skin color, texture, turgor normal, no rashes or lesions on visualized portions of skin  Neurologic: No gross deficits   The plan was reviewed with the patient/family, and all questions/concerned were addressed.  It was my pleasure to see Adriana Jackson today and participate in Adriana Jackson's care. Please feel free to contact me with any questions or concerns.  Sincerely,  Ferol Luz, MD Allergy & Immunology  [facility] office: [phone] [facility] office: [phone]

## 2023-06-03 ENCOUNTER — Encounter: Payer: Self-pay | Admitting: Physical Medicine and Rehabilitation

## 2023-06-05 ENCOUNTER — Encounter: Payer: Self-pay | Admitting: Internal Medicine

## 2023-06-12 ENCOUNTER — Encounter: Payer: MEDICAID | Attending: Physical Medicine and Rehabilitation | Admitting: Physical Medicine and Rehabilitation

## 2023-06-12 DIAGNOSIS — M25561 Pain in right knee: Secondary | ICD-10-CM | POA: Diagnosis not present

## 2023-06-12 DIAGNOSIS — M25562 Pain in left knee: Secondary | ICD-10-CM | POA: Insufficient documentation

## 2023-06-12 MED ORDER — PREGABALIN 25 MG PO CAPS: 25.0000 mg | ORAL_CAPSULE | Freq: Every evening | ORAL | 3 refills | Status: DC

## 2023-06-12 NOTE — Progress Notes (Signed)
Subjective:    Patient ID: Adriana Jackson, adult    DOB: May 19, 2001, 22 y.o.   MRN: [id number]  HPI An audio/video tele-health visit is felt to be the most appropriate encounter for this patient at this time. This is a follow up tele-visit via phone. The patient is at home. MD is at office. Prior to scheduling this appointment, our staff discussed the limitations of evaluation and management by telemedicine and the availability of in-person appointments. The patient expressed understanding and agreed to proceed.   Adriana Jackson is a 22 year old woman who presents for follow-up of diffuse joint pain.  1) Diffuse joint pain: -knees, ankles, and wrists are affected -she is interested in medications for her pain -she did exercises and these helped with her knees -has never tried a tens unit -she found benefits from cymbalta but it caused her to have hot flashes -she has been sleeping well at night  2) Fatigue:  -fatigue has been present since the onset of her pain.   Pain Inventory Average Pain 7 Pain Right Now 8 My pain is constant and aching  In the last 24 hours, has pain interfered with the following? General activity 6 Relation with others 7 Enjoyment of life 9 What TIME of day is your pain at its worst? night Sleep (in general) Fair  Pain is worse with: bending, sitting, and inactivity Pain improves with: rest and medication Relief from Meds: 5  walk without assistance how many minutes can you walk? 60 ability to climb steps?  yes do you drive?  no  not employed: date last employed .  spasms dizziness  New pt  New pt    Family History  Problem Relation Age of Onset   Asthma Mother    Irritable bowel syndrome Mother    Fibromyalgia Mother    Osteoporosis Mother    Hypothyroidism Mother    Allergic rhinitis Father    Rheum arthritis Father    Hypertension Father    Multiple sclerosis Sister    Colon cancer Neg Hx    Esophageal cancer Neg Hx    Social  History   Socioeconomic History   Marital status: Single    Spouse name: Not on file   Number of children: Not on file   Years of education: Not on file   Highest education level: Not on file  Occupational History   Not on file  Tobacco Use   Smoking status: Never    Passive exposure: Past   Smokeless tobacco: Never  Vaping Use   Vaping status: Never Used  Substance and Sexual Activity   Alcohol use: Not Currently    Comment: 4 shots/day   Drug use: Not Currently    Types: Marijuana    Comment: delta 8   Sexual activity: Yes    Birth control/protection: Implant  Other Topics Concern   Not on file  Social History Narrative   Not on file   Social Determinants of Health   Financial Resource Strain: Not on file  Food Insecurity: Not on file  Transportation Needs: Not on file  Physical Activity: Not on file  Stress: Not on file  Social Connections: Not on file   Past Surgical History:  Procedure Laterality Date   COLONOSCOPY     Past Medical History:  Diagnosis Date   Anal fissure    Anxiety 03/30/2021   Asthma    Bipolar 1 disorder (HCC)    Depression 03/30/2021   There were no  vitals taken for this visit.  Opioid Risk Score:   Fall Risk Score:  `1  Depression screen [facility] 2/9     05/08/2023   11:23 AM 01/19/2023   10:05 AM 11/03/2022    8:32 AM 07/12/2022    1:30 PM 04/06/2022    8:59 AM 12/07/2021    1:15 PM 03/30/2021    2:50 AM  Depression screen PHQ 2/9  Decreased Interest 0 1 1 1  0 1 3  Down, Depressed, Hopeless 0 0 1 1 0 1 3  PHQ - 2 Score 0 1 2 2  0 2 6  Altered sleeping 2 0 1 1 0 2 3  Tired, decreased energy 3 2 1 1 2 2 3   Change in appetite 1 0 1 1 0 2 3  Feeling bad or failure about yourself  0 0 0 0 0 1 3  Trouble concentrating 1 0 1 1 0 3 2  Moving slowly or fidgety/restless 0 0 0 0 0 0 2  Suicidal thoughts 0 0 0 0 0 0 2  PHQ-9 Score 7 3 6 6 2 12 24   Difficult doing work/chores Not difficult at all Not difficult at all Not difficult at all  Not difficult at all Not difficult at all  Very difficult     Review of Systems  Constitutional:  Positive for appetite change.  Gastrointestinal:  Positive for nausea.  Musculoskeletal:        Bilateral wrist pain Bilateral knee pain Bilateral ankle pain  Neurological:  Positive for dizziness and weakness.  All other systems reviewed and are negative.      Objective:   Physical Exam PRIOR EXAM: Gen: no distress, normal appearing HEENT: oral mucosa pink and moist, NCAT Cardio: Reg rate Chest: normal effort, normal rate of breathing Abd: soft, non-distended Ext: no edema Psych: pleasant, normal affect Skin: intact Neuro: Alert and oriented x3      Assessment & Plan:   1) Chronic joint pain secondary to fibromyalgia: -Discussed current symptoms of pain and history of pain.   -cymbalta 20mg  daily ordered but this caused hot flashes -lyrica 25mg  HS ordered  Prescribed Zynex Nexwave  -Discussed benefits of exercise in reducing pain. -Discussed following foods that may reduce pain: 1) Ginger (especially studied for arthritis)- reduce leukotriene production to decrease inflammation 2) Blueberries- high in phytonutrients that decrease inflammation 3) Salmon- marine omega-3s reduce joint swelling and pain 4) Pumpkin seeds- reduce inflammation 5) dark chocolate- reduces inflammation 6) turmeric- reduces inflammation 7) tart cherries - reduce pain and stiffness 8) extra virgin olive oil - its compound olecanthal helps to block prostaglandins  9) chili peppers- can be eaten or applied topically via capsaicin 10) mint- helpful for headache, muscle aches, joint pain, and itching 11) garlic- reduces inflammation  Link to further information on diet for chronic pain: [email or web address]   2) Fatigue: -vitamin D level ordered for today  5 minutes spent discussing that she had hot flashes in response to  cymbalta but it did help, recommended stopping cymbalta and trial of lyrica

## 2023-06-16 ENCOUNTER — Telehealth: Payer: MEDICAID | Admitting: Nurse Practitioner

## 2023-06-16 DIAGNOSIS — R3989 Other symptoms and signs involving the genitourinary system: Secondary | ICD-10-CM | POA: Diagnosis not present

## 2023-06-16 MED ORDER — NITROFURANTOIN MONOHYD MACRO 100 MG PO CAPS
100.0000 mg | ORAL_CAPSULE | Freq: Two times a day (BID) | ORAL | 0 refills | Status: AC
Start: 2023-06-16 — End: 2023-06-21

## 2023-06-16 NOTE — Progress Notes (Signed)

## 2023-06-16 NOTE — Progress Notes (Signed)
I have spent 5 minutes in review of e-visit questionnaire, review and updating patient chart, medical decision making and response to patient.  ° °Jerrell Mangel W Secilia Apps, NP ° °  °

## 2023-06-26 ENCOUNTER — Encounter: Payer: Self-pay | Admitting: Physical Therapy

## 2023-06-26 ENCOUNTER — Ambulatory Visit: Payer: MEDICAID | Attending: Physical Medicine and Rehabilitation | Admitting: Physical Therapy

## 2023-06-26 ENCOUNTER — Other Ambulatory Visit: Payer: Self-pay | Admitting: Internal Medicine

## 2023-06-26 ENCOUNTER — Other Ambulatory Visit: Payer: Self-pay

## 2023-06-26 DIAGNOSIS — G8929 Other chronic pain: Secondary | ICD-10-CM | POA: Insufficient documentation

## 2023-06-26 DIAGNOSIS — M25561 Pain in right knee: Secondary | ICD-10-CM | POA: Insufficient documentation

## 2023-06-26 DIAGNOSIS — M25562 Pain in left knee: Secondary | ICD-10-CM | POA: Diagnosis present

## 2023-06-26 DIAGNOSIS — J3089 Other allergic rhinitis: Secondary | ICD-10-CM

## 2023-06-26 DIAGNOSIS — M6281 Muscle weakness (generalized): Secondary | ICD-10-CM | POA: Insufficient documentation

## 2023-06-26 NOTE — Therapy (Signed)
OUTPATIENT PHYSICAL THERAPY LOWER EXTREMITY EVALUATION  Patient Name: Adriana Jackson MRN: [id number] DOB:[birth year], 22 y.o., adult Today's Date: 06/26/2023   PT End of Session - 06/26/23 1122     Visit Number 1    Date for PT Re-Evaluation 08/21/23    Authorization Type [employer] - FOTO    PT Start Time 0915    PT Stop Time 0956    PT Time Calculation (min) 41 min             Past Medical History:  Diagnosis Date   Anal fissure    Anxiety 03/30/2021   Asthma    Bipolar 1 disorder (HCC)    Depression 03/30/2021   Past Surgical History:  Procedure Laterality Date   COLONOSCOPY     Patient Active Problem List   Diagnosis Date Noted   Severe episode of recurrent major depressive disorder, with psychotic features (HCC)    Vitamin D deficiency 09/15/2020    PCP: Allwardt, Crist Infante, PA-C  REFERRING PROVIDER: Horton Chin, MD  THERAPY DIAG:  Chronic pain of left knee - Plan: PT plan of care cert/re-cert  Chronic pain of right knee - Plan: PT plan of care cert/re-cert  Muscle weakness - Plan: PT plan of care cert/re-cert  REFERRING DIAG: Arthralgia of both lower legs [M25.561, M25.562]   Rationale for Evaluation and Treatment:  Rehabilitation  SUBJECTIVE:  PERTINENT PAST HISTORY:  Depression, bipolar, fibromyalgia?, asthma      PRECAUTIONS: None  WEIGHT BEARING RESTRICTIONS No  FALLS:  Has patient fallen in last 6 months? No, Number of falls: 0  MOI/History of condition:  Onset date: 3-4 years  SUBJECTIVE STATEMENT  Adriana Jackson is a 22 y.o.  who presents to clinic with chief complaint of wrist and knee pain.  They report that the knee pain started suddenly with no acute injury or trauma.  They report that their knee pain is constant but gets worse with seasonal changes and with rainy weather.  Activity makes the pain worse.  Pt has had a rheumatological work up which was unremarkable.  Pt's MD believes she has fibromyalgia.   Red flags:  denies    Pain:  Are you having pain? Yes Pain location: diffuse knee and peripatellar pain NPRS scale:  4/10 to 9/10 Aggravating factors: walking, steps, sitting for long periods, standing for long periods Relieving factors: lying down Pain description: constant and aching Stage: Chronic 24 hour pattern: worse first thing in the morning   Occupation: NA  Assistive Device: NA  Hand Dominance: NA  Patient Goals/Specific Activities:   Patient Specific Functional Scale:  Activity Eval         walking 8         Sitting for extended periods 5         Going down stairs 3                   Average 5.33          (Activities rated 0-10/10.  "10" represents "able to perform at prior level" while "0" represents "unable to perform." )   OBJECTIVE:   DIAGNOSTIC FINDINGS:  NA   GENERAL OBSERVATION/GAIT:   WNL  SENSATION:  Light touch: Appears intact  PALPATION: Pain with patellar movement with excessive motion R>L   LE MMT:  MMT Right (Eval) Left (Eval)  Hip flexion (L2, L3) 4+ 4  Knee extension (L3) 4+ 4  Knee flexion 4+ 4  Hip abduction 4 4  Hip extension 4 4  Hip external rotation    Hip internal rotation    Hip adduction    Ankle dorsiflexion (L4)    Ankle plantarflexion (S1)    Ankle inversion    Ankle eversion    Great Toe ext (L5)    Grossly     (Blank rows = not tested, score listed is out of 5 possible points.  N = WNL, D = diminished, C = clear for gross weakness with myotome testing, * = concordant pain with testing)  LE ROM:  ROM Right (Eval) Left (Eval)  Hip flexion    Hip extension    Hip abduction    Hip adduction    Hip internal rotation    Hip external rotation    Knee extension n n  Knee flexion n n  Ankle dorsiflexion    Ankle plantarflexion    Ankle inversion    Ankle eversion     (Blank rows = not tested, N = WNL, * = concordant pain with testing)  Functional Tests  Eval (06/26/2023)    30'' STS: 8x  UE used? N     30''  step up 8'': L 9x, R 8x                                                      SPECIAL TESTS:   Patellar grind (+)   PATIENT SURVEYS:  FOTO 47 - > 65   TODAY'S TREATMENT: Creating, reviewing, and completing below HEP   PATIENT EDUCATION:  POC, diagnosis, prognosis, HEP, and outcome measures.  Pt educated via explanation, demonstration, and handout (HEP).  Pt confirms understanding verbally.   HOME EXERCISE PROGRAM: Access Code: [email or web address] URL: [email or web address] Date: 06/26/2023 Prepared by: Adriana Jackson  Exercises - Supine Quad Set  - 3 x daily - 7 x weekly - 2 sets - 10 reps - 10 second hold - Supine Active Straight Leg Raise  - 1 x daily - 7 x weekly - 3 sets - 10 reps - Hooklying Isometric Clamshell  - 1 x daily - 7 x weekly - 3 sets - 10 reps  Treatment priorities   Eval        General knee and hip strengthening        Look at balance        Aquatic therapy for general activity and fibromyalgia pain management                           ASSESSMENT:  CLINICAL IMPRESSION: Adriana Jackson is a 22 y.o.  who presents to clinic with signs and sxs consistent with chronic bil knee pain.  Pt with diffuse body aches as well as joint pain which is located mainly in the wrists and knees which may be consistent with MD report of fibromyalgia.  Knee strengthening has been beneficial in the past.  They are interested in a short trial of aquatic therapy for possible long term global exercise and pain relief.  Pt will benefit from skilled therapy to address relevant deficits and improve function.  OBJECTIVE IMPAIRMENTS: Pain, knee and hip strength  ACTIVITY LIMITATIONS: steps, sitting, standing, walking  PERSONAL FACTORS: See medical history and pertinent history   REHAB POTENTIAL: Good  CLINICAL DECISION MAKING: Stable/uncomplicated  EVALUATION COMPLEXITY: Low  GOALS:   SHORT TERM GOALS: Target date: 07/24/2023   Adriana Jackson will be >75% HEP compliant  to improve carryover between sessions and facilitate independent management of condition  Evaluation: ongoing Goal status: INITIAL   LONG TERM GOALS: Target date: 08/21/2023   Adriana Jackson will improve FOTO score to 65 as a proxy for functional improvement  Evaluation/Baseline: 47 Goal status: INITIAL    2.  Adriana Jackson will show a >/= 2 pt improvement in their average PSFS score as a proxy for functional improvement   Evaluation/Baseline:  Patient Specific Functional Scale:  Activity Eval         walking 8         Sitting for extended periods 5         Going down stairs 3                   Average 5.33          (Activities rated 0-10/10.  "10" represents "able to perform at prior level" while "0" represents "unable to perform." )  Minimum detectable change (90%CI) for average score = 2 points Minimum detectable change (90%CI) for single activity score = 3 points   Goal status: INITIAL    3.  Adriana Jackson will improve reps of 30'' step-up to 8'' box to >/= 11x (bilateral LE(s)) to show improved LE strength and ability to navigate steps   Evaluation/Baseline: L 9x, R 8x Goal status: INITIAL    4.  Adriana Jackson will improve 30'' STS (MCID 2) to >/= 10x (w/ UE?: n) to show improved LE strength and improved transfers   Evaluation/Baseline: 8x  w/ UE? n Goal status: INITIAL   5.  Adriana Jackson will report confidence in self management of condition at time of discharge with advanced HEP  Evaluation/Baseline: unable to self manage Goal status: INITIAL   6.  Adriana Jackson will self report >/= 50% decrease in pain from evaluation   Evaluation/Baseline: 9/10 max pain Goal status: INITIAL   PLAN: PT FREQUENCY: 1-2x/week  PT DURATION: 8 weeks  PLANNED INTERVENTIONS: Therapeutic exercises, Aquatic therapy, Therapeutic activity, Neuro Muscular re-education, Gait training, Patient/Family education, Joint mobilization, Dry Needling, Electrical stimulation, Spinal mobilization and/or manipulation, Moist heat,  Taping, Vasopneumatic device, Ionotophoresis 4mg /ml Dexamethasone, and Manual therapy   Adriana Jackson PT, DPT 06/26/2023, 11:25 AM  I just finished a MCD eval/recert.  Name: Adriana Jackson  MRN: [id number] Please request 2x/week for 6 weeks.  Check all conditions that are expected to impact treatment: Musculoskeletal disorders   Check all possible CPT codes: 69629- Therapeutic Exercise, [id number]- Neuro Re-education, [phone] - Gait Training, [phone] - Manual Therapy, 97530 - Therapeutic Activities, 97535 - Self Care, [phone] - Re-evaluation, [id number] - Mechanical traction, and [phone] - Aquatic therapy

## 2023-06-27 DIAGNOSIS — J301 Allergic rhinitis due to pollen: Secondary | ICD-10-CM | POA: Diagnosis not present

## 2023-06-27 NOTE — Progress Notes (Signed)
VIALS EXP 06-26-24

## 2023-06-27 NOTE — Progress Notes (Signed)
Ordering Provider: Dr. Ferol Luz   Patient Details  Name: Adriana Jackson  MRN: [id number]  Date of Birth: 07-19-2001   Order 2 of 2   Vial Label: C-DM-R   0.5 ml (Volume)  1:10 Concentration -- Cat Hair  0.3 ml (Volume)  1:20 Concentration -- Cockroach, German  0.5 ml (Volume)   AU Concentration -- Mite Mix (DF 5,000 & DP 5,000)    1.3  ml Extract Subtotal  3.7  ml Diluent  5.0  ml Maintenance Total   Schedule:   Blue Vial (1:100,000): Schedule B (6 doses)  Yellow Vial (1:10,000): Schedule B (6 doses)  Green Vial (1:1,000): Schedule B (6 doses)  Red Vial (1:100): Schedule A (10 doses)   Special Instructions: none

## 2023-06-27 NOTE — Progress Notes (Signed)
Aeroallergen Immunotherapy   Ordering Provider: Dr. Ferol Luz   Patient Details  Name: Adriana Jackson  MRN: [id number]  Date of Birth: 2001-04-13   Order 1 of 2   Vial Label: G-W-T   0.3 ml (Volume)  BAU Concentration -- 7 Grass Mix* 100,000 ([address] [place], [place], [place], [place], Red[place], Sweet Vernal, Timothy)  0.2 ml (Volume)  1:20 Concentration -- [place]  0.3 ml (Volume)  BAU Concentration -- [place] 10,000  0.2 ml (Volume)  1:20 Concentration -- [place]  0.3 ml (Volume)  1:20 Concentration -- Ragweed Mix  0.2 ml (Volume)  1:20 Concentration -- Cocklebur  0.2 ml (Volume)  1:10 Concentration -- Plantain English  0.2 ml (Volume)  1:20 Concentration -- [place] Thistle  0.2 ml (Volume)  1:40 Concentration -- Baccharis  0.2 ml (Volume)  1:80 Concentration -- Dogfennel  0.5 ml (Volume)  1:20 Concentration -- Weed Mix*  0.5 ml (Volume)  1:20 Concentration -- Eastern 10 Tree Mix (also Sweet Gum)  0.2 ml (Volume)  1:20 Concentration -- Box Elder  0.2 ml (Volume)  1:10 Concentration -- Cedar, red  0.2 ml (Volume)  1:10 Concentration -- Pecan Pollen  0.2 ml (Volume)  1:10 Concentration -- Pine Mix  0.2 ml (Volume)  1:20 Concentration -- Red Mulberry    4.3  ml Extract Subtotal  0.3  ml Diluent  5.0  ml Maintenance Total   Schedule:    Blue Vial (1:100,000): Schedule B (6 doses)  Yellow Vial (1:10,000): Schedule B (6 doses)  Green Vial (1:1,000): Schedule B (6 doses)  Red Vial (1:100): Schedule A (10 doses)   Special Instructions: none

## 2023-06-28 DIAGNOSIS — J3081 Allergic rhinitis due to animal (cat) (dog) hair and dander: Secondary | ICD-10-CM | POA: Diagnosis not present

## 2023-07-06 ENCOUNTER — Ambulatory Visit: Payer: MEDICAID | Admitting: Physical Therapy

## 2023-07-06 ENCOUNTER — Encounter: Payer: Self-pay | Admitting: Physical Therapy

## 2023-07-06 ENCOUNTER — Ambulatory Visit: Payer: MEDICAID | Admitting: *Deleted

## 2023-07-06 DIAGNOSIS — G8929 Other chronic pain: Secondary | ICD-10-CM

## 2023-07-06 DIAGNOSIS — J309 Allergic rhinitis, unspecified: Secondary | ICD-10-CM

## 2023-07-06 DIAGNOSIS — M6281 Muscle weakness (generalized): Secondary | ICD-10-CM

## 2023-07-06 DIAGNOSIS — M25562 Pain in left knee: Secondary | ICD-10-CM | POA: Diagnosis not present

## 2023-07-06 NOTE — Therapy (Signed)
Daily Note  Patient Name: Adriana Jackson MRN: [id number] DOB:24-May-2001, 22 y.o., adult Today's Date: 07/06/2023   PT End of Session - 07/06/23 0920     Visit Number 2    Date for PT Re-Evaluation 08/21/23    Authorization Type [employer] [facility] - FOTO    PT Start Time 0919    PT Stop Time 1000    PT Time Calculation (min) 41 min             Past Medical History:  Diagnosis Date   Anal fissure    Anxiety 03/30/2021   Asthma    Bipolar 1 disorder (HCC)    Depression 03/30/2021   Past Surgical History:  Procedure Laterality Date   COLONOSCOPY     Patient Active Problem List   Diagnosis Date Noted   Severe episode of recurrent major depressive disorder, with psychotic features (HCC)    Vitamin D deficiency 09/15/2020    PCP: Allwardt, Crist Infante, PA-C  REFERRING PROVIDER: Allwardt, Crist Infante, PA-C  THERAPY DIAG:  Chronic pain of left knee  Chronic pain of right knee  Muscle weakness  REFERRING DIAG: Arthralgia of both lower legs [M25.561, M25.562]   Rationale for Evaluation and Treatment:  Rehabilitation  SUBJECTIVE:  PERTINENT PAST HISTORY:  Depression, bipolar, fibromyalgia?, asthma      PRECAUTIONS: None  WEIGHT BEARING RESTRICTIONS No  FALLS:  Has patient fallen in last 6 months? No, Number of falls: 0  MOI/History of condition:  Onset date: 3-4 years  SUBJECTIVE STATEMENT  Pt reports improvement in knee pain.  They have been HEP compliant.  Pt reports that current pain is 6/10.   Red flags:  denies   Pain:  Are you having pain? Yes Pain location: diffuse knee and peripatellar pain NPRS scale:  6/10 Aggravating factors: walking, steps, sitting for long periods, standing for long periods Relieving factors: lying down Pain description: constant and aching Stage: Chronic 24 hour pattern: worse first thing in the morning   Occupation: NA  Assistive Device: NA  Hand Dominance: NA  Patient Goals/Specific Activities:   Patient Specific  Functional Scale:  Activity Eval         walking 8         Sitting for extended periods 5         Going down stairs 3                   Average 5.33          (Activities rated 0-10/10.  "10" represents "able to perform at prior level" while "0" represents "unable to perform." )   OBJECTIVE:   DIAGNOSTIC FINDINGS:  NA   GENERAL OBSERVATION/GAIT:   WNL  SENSATION:  Light touch: Appears intact  PALPATION: Pain with patellar movement with excessive motion R>L   LE MMT:  MMT Right (Eval) Left (Eval)  Hip flexion (L2, L3) 4+ 4  Knee extension (L3) 4+ 4  Knee flexion 4+ 4  Hip abduction 4 4  Hip extension 4 4  Hip external rotation    Hip internal rotation    Hip adduction    Ankle dorsiflexion (L4)    Ankle plantarflexion (S1)    Ankle inversion    Ankle eversion    Great Toe ext (L5)    Grossly     (Blank rows = not tested, score listed is out of 5 possible points.  N = WNL, D = diminished, C = clear for gross weakness with myotome  testing, * = concordant pain with testing)  LE ROM:  ROM Right (Eval) Left (Eval)  Hip flexion    Hip extension    Hip abduction    Hip adduction    Hip internal rotation    Hip external rotation    Knee extension n n  Knee flexion n n  Ankle dorsiflexion    Ankle plantarflexion    Ankle inversion    Ankle eversion     (Blank rows = not tested, N = WNL, * = concordant pain with testing)  Functional Tests  Eval     30'' STS: 8x  UE used? N     30'' step up 8'': L 9x, R 8x                                                      SPECIAL TESTS:   Patellar grind (+)   PATIENT SURVEYS:  FOTO 47 - > 65   TODAY'S TREATMENT:  [facility] Adult PT Treatment:  Therapeutic Exercise:  Bike 5 min SLR - 2x10 Side clam - RTB Supine bride with pball - 2x10 (normal bridge d/c d/t knee pain) Prone hip ext - 2x10 Rocker board DF/PF - 2' Decline mini squat - 2x10 Heel raise - 2x10  HOME EXERCISE PROGRAM: Access  Code: [email or web address] URL: [email or web address] Date: 06/26/2023 Prepared by: Alphonzo Severance  Exercises - Supine Quad Set  - 3 x daily - 7 x weekly - 2 sets - 10 reps - 10 second hold - Supine Active Straight Leg Raise  - 1 x daily - 7 x weekly - 3 sets - 10 reps - Hooklying Isometric Clamshell  - 1 x daily - 7 x weekly - 3 sets - 10 reps  Treatment priorities   Eval        General knee and hip strengthening        Look at balance        Aquatic therapy for general activity and fibromyalgia pain management                           ASSESSMENT:  CLINICAL IMPRESSION: PT reports to therapy with 6/10 knee pain today which they state is an improvement.  They are able to complete all listed exercises with fatigue but minimal increase in pain.  Following therapy pain is 7/10.  We continued to work on mainly isometric OC exercises, but pt is able to complete decline squat with minimal agging of sxs.  We will work toward CC exercises as tolerated.  OBJECTIVE IMPAIRMENTS: Pain, knee and hip strength  ACTIVITY LIMITATIONS: steps, sitting, standing, walking  PERSONAL FACTORS: See medical history and pertinent history   REHAB POTENTIAL: Good  CLINICAL DECISION MAKING: Stable/uncomplicated  EVALUATION COMPLEXITY: Low   GOALS:   SHORT TERM GOALS: Target date: 07/24/2023   [name] will be >75% HEP compliant to improve carryover between sessions and facilitate independent management of condition  Evaluation: ongoing Goal status: INITIAL   LONG TERM GOALS: Target date: 08/21/2023   [name] will improve FOTO score to 65 as a proxy for functional improvement  Evaluation/Baseline: 47 Goal status: INITIAL    2.  [name] will show a >/= 2 pt improvement in their average PSFS score as a proxy for functional improvement   Evaluation/Baseline:  Patient Specific Functional Scale:  Activity Eval         walking 8         Sitting for extended periods 5         Going down  stairs 3                   Average 5.33          (Activities rated 0-10/10.  "10" represents "able to perform at prior level" while "0" represents "unable to perform." )  Minimum detectable change (90%CI) for average score = 2 points Minimum detectable change (90%CI) for single activity score = 3 points   Goal status: INITIAL    3.  [name] will improve reps of 30'' step-up to 8'' box to >/= 11x (bilateral LE(s)) to show improved LE strength and ability to navigate steps   Evaluation/Baseline: L 9x, R 8x Goal status: INITIAL    4.  [name] will improve 30'' STS (MCID 2) to >/= 10x (w/ UE?: n) to show improved LE strength and improved transfers   Evaluation/Baseline: 8x  w/ UE? n Goal status: INITIAL   5.  [name] will report confidence in self management of condition at time of discharge with advanced HEP  Evaluation/Baseline: unable to self manage Goal status: INITIAL   6.  [name] will self report >/= 50% decrease in pain from evaluation   Evaluation/Baseline: 9/10 max pain Goal status: INITIAL   PLAN: PT FREQUENCY: 1-2x/week  PT DURATION: 8 weeks  PLANNED INTERVENTIONS: Therapeutic exercises, Aquatic therapy, Therapeutic activity, Neuro Muscular re-education, Gait training, Patient/Family education, Joint mobilization, Dry Needling, Electrical stimulation, Spinal mobilization and/or manipulation, Moist heat, Taping, Vasopneumatic device, Ionotophoresis 4mg /ml Dexamethasone, and Manual therapy   Alphonzo Severance PT, DPT 07/06/2023, 9:59 AM  I just finished a [facility] eval/recert.  Name: Lezley Bedgood  MRN: [id number] Please request 2x/week for 6 weeks.  Check all conditions that are expected to impact treatment: Musculoskeletal disorders   Check all possible CPT codes: 08657- Therapeutic Exercise, [phone]- Neuro Re-education, [phone] - Gait Training, [phone] - Manual Therapy, 97530 - Therapeutic Activities, 97535 - Self Care, [phone] - Re-evaluation, [id number] - Mechanical traction, and  [phone] - Aquatic therapy

## 2023-07-06 NOTE — Progress Notes (Signed)
Immunotherapy   Patient Details  Name: Adriana Jackson MRN: [id number] Date of Birth: 08-09-2001  07/06/2023  [name] started injections for  G-W-T, C-Dm-R Following schedule: B  Frequency:1 time per week Epi-Pen:Epi-Pen Available  Consent signed and patient instructions given. Patient started allergy injections today and received 0.13mL of G-W-T in the RUA and 0.54mL of CM-R in the LUA. Patient waited 30 minutes in office and did not experience any issues.   Simuel Stebner Fernandez-Vernon 07/06/2023, 11:29 AM

## 2023-07-11 ENCOUNTER — Ambulatory Visit: Payer: MEDICAID | Admitting: Physical Therapy

## 2023-07-12 NOTE — Therapy (Signed)
Daily Note  Patient Name: Adriana Jackson MRN: [id number] DOB:02/18/2001, 22 y.o., adult Today's Date: 07/13/2023   PT End of Session - 07/13/23 1152     Visit Number 3    Date for PT Re-Evaluation 08/21/23    Authorization Type [employer] - FOTO    PT Start Time 1225    PT Stop Time 1306    PT Time Calculation (min) 41 min    Activity Tolerance Patient tolerated treatment well    Behavior During Therapy [facility] for tasks assessed/performed              Past Medical History:  Diagnosis Date   Anal fissure    Anxiety 03/30/2021   Asthma    Bipolar 1 disorder (HCC)    Depression 03/30/2021   Past Surgical History:  Procedure Laterality Date   COLONOSCOPY     Patient Active Problem List   Diagnosis Date Noted   Severe episode of recurrent major depressive disorder, with psychotic features (HCC)    Vitamin D deficiency 09/15/2020    PCP: Allwardt, Crist Infante, PA-C  REFERRING PROVIDER: Allwardt, Crist Infante, PA-C  THERAPY DIAG:  Chronic pain of left knee  Chronic pain of right knee  Muscle weakness  REFERRING DIAG: Arthralgia of both lower legs [M25.561, M25.562]   Rationale for Evaluation and Treatment:  Rehabilitation  SUBJECTIVE:  PERTINENT PAST HISTORY:  Depression, bipolar, fibromyalgia?, asthma      PRECAUTIONS: None  WEIGHT BEARING RESTRICTIONS No  FALLS:  Has patient fallen in last 6 months? No, Number of falls: 0  MOI/History of condition:  Onset date: 3-4 years  SUBJECTIVE STATEMENT Patient reports continued BIL knee pain, has been compliant with HEP.    Red flags:  denies   Pain:  Are you having pain? Yes Pain location: diffuse knee and peripatellar pain NPRS scale:  7/10 Aggravating factors: walking, steps, sitting for long periods, standing for long periods Relieving factors: lying down Pain description: constant and aching Stage: Chronic 24 hour pattern: worse first thing in the morning   Occupation: NA  Assistive Device:  NA  Hand Dominance: NA  Patient Goals/Specific Activities:   Patient Specific Functional Scale:  Activity Eval         walking 8         Sitting for extended periods 5         Going down stairs 3                   Average 5.33          (Activities rated 0-10/10.  "10" represents "able to perform at prior level" while "0" represents "unable to perform." )   OBJECTIVE:   DIAGNOSTIC FINDINGS:  NA   GENERAL OBSERVATION/GAIT:   WNL  SENSATION:  Light touch: Appears intact  PALPATION: Pain with patellar movement with excessive motion R>L   LE MMT:  MMT Right (Eval) Left (Eval)  Hip flexion (L2, L3) 4+ 4  Knee extension (L3) 4+ 4  Knee flexion 4+ 4  Hip abduction 4 4  Hip extension 4 4  Hip external rotation    Hip internal rotation    Hip adduction    Ankle dorsiflexion (L4)    Ankle plantarflexion (S1)    Ankle inversion    Ankle eversion    Great Toe ext (L5)    Grossly     (Blank rows = not tested, score listed is out of 5 possible points.  N = WNL,  D = diminished, C = clear for gross weakness with myotome testing, * = concordant pain with testing)  LE ROM:  ROM Right (Eval) Left (Eval)  Hip flexion    Hip extension    Hip abduction    Hip adduction    Hip internal rotation    Hip external rotation    Knee extension n n  Knee flexion n n  Ankle dorsiflexion    Ankle plantarflexion    Ankle inversion    Ankle eversion     (Blank rows = not tested, N = WNL, * = concordant pain with testing)  Functional Tests  Eval     30'' STS: 8x  UE used? N     30'' step up 8'': L 9x, R 8x                                                      SPECIAL TESTS:   Patellar grind (+)   PATIENT SURVEYS:  FOTO 47 - > 65   TODAY'S TREATMENT: [facility] Adult PT Treatment:                                                DATE: 07/13/23 Aquatic therapy at [facility]- [address] - therapeutic pool temp approximately 92 degrees. Pt enters  building ambulating independently. Treatment took place in water 3.8 to  4 ft 8 in. deep depending upon activity.  Pt entered and exited the pool via stair and handrails independently. Patient entered water for aquatic therapy for first time and was introduced to principles and therapeutic effects of water as they ambulated and acclimated to pool.  Aquatic Exercise: Walking forward/backwards/side stepping Sidestepping rainbow DB shoulder abd/add x2 laps Runners stretch on bottom step x30" BIL Hamstring stretch on bottom step x30" BIL Figure 4 squat stretch, BIL UE support x30" BIL Standing with UE support edge of pool: Hip abd/add x15 BIL Hip ext/flex with knee straight x 15 BIL Hip Circles CC/CCW x10 each BIL Marching hip flexion to knee extension x15 BIL Hamstring curl x20 BIL Heel/toe raise 2x15 Squats 2x15 Sitting on submerged bench: Alternating LAQ x1' Bicycle kicks x1' Scissor kicks x1' Kickboard push/pull x1' Kickboard push downs x1'  Pt requires the buoyancy of water for active assisted exercises with buoyancy supported for strengthening and AROM exercises. Hydrostatic pressure also supports joints by unweighting joint load by at least 50 % in 3-4 feet depth water. 80% in chest to neck deep water. Water will provide assistance with movement using the current and laminar flow while the buoyancy reduces weight bearing. Pt requires the viscosity of the water for resistance with strengthening exercises.   [facility] Adult PT Treatment:  Therapeutic Exercise:  Bike 5 min SLR - 2x10 Side clam - RTB Supine bride with pball - 2x10 (normal bridge d/c d/t knee pain) Prone hip ext - 2x10 Rocker board DF/PF - 2' Decline mini squat - 2x10 Heel raise - 2x10  HOME EXERCISE PROGRAM: Access Code: [email or web address] URL: [email or web address] Date: 06/26/2023 Prepared by: Alphonzo Severance  Exercises - Supine Quad Set  - 3 x daily - 7 x weekly - 2 sets - 10 reps - 10 second hold -  Supine Active Straight Leg Raise  - 1 x daily - 7 x weekly - 3 sets - 10 reps - Hooklying Isometric Clamshell  - 1 x daily - 7 x weekly - 3 sets - 10 reps  Treatment priorities   Eval        General knee and hip strengthening        Look at balance        Aquatic therapy for general activity and fibromyalgia pain management                           ASSESSMENT:  CLINICAL IMPRESSION: Patient presents to first aquatic PT session reporting continued BIL knee pain and that they have been compliant with their HEP. Session today focused on general mobility and standing activity tolerance in the aquatic environment for use of buoyancy to offload joints and the viscosity of water as resistance during therapeutic exercise. Patient was able to tolerate all prescribed exercises in the aquatic environment with no adverse effects. Patient continues to benefit from skilled PT services on land and aquatic based and should be progressed as able to improve functional independence.    OBJECTIVE IMPAIRMENTS: Pain, knee and hip strength  ACTIVITY LIMITATIONS: steps, sitting, standing, walking  PERSONAL FACTORS: See medical history and pertinent history   REHAB POTENTIAL: Good  CLINICAL DECISION MAKING: Stable/uncomplicated  EVALUATION COMPLEXITY: Low   GOALS:   SHORT TERM GOALS: Target date: 07/24/2023   Adriana Jackson will be >75% HEP compliant to improve carryover between sessions and facilitate independent management of condition  Evaluation: ongoing Goal status: MET Pt reports adherence 07/13/23   LONG TERM GOALS: Target date: 08/21/2023   Adriana Jackson will improve FOTO score to 65 as a proxy for functional improvement  Evaluation/Baseline: 47 Goal status: INITIAL    2.  Adriana Jackson will show a >/= 2 pt improvement in their average PSFS score as a proxy for functional improvement   Evaluation/Baseline:  Patient Specific Functional Scale:  Activity Eval         walking 8         Sitting for  extended periods 5         Going down stairs 3                   Average 5.33          (Activities rated 0-10/10.  "10" represents "able to perform at prior level" while "0" represents "unable to perform." )  Minimum detectable change (90%CI) for average score = 2 points Minimum detectable change (90%CI) for single activity score = 3 points   Goal status: INITIAL    3.  Adriana Jackson will improve reps of 30'' step-up to 8'' box to >/= 11x (bilateral LE(s)) to show improved LE strength and ability to navigate steps   Evaluation/Baseline: L 9x, R 8x Goal status: INITIAL    4.  Adriana Jackson will improve 30'' STS (MCID 2) to >/= 10x (w/ UE?: n) to show improved LE strength and improved transfers   Evaluation/Baseline: 8x  w/ UE? n Goal status: INITIAL   5.  Adriana Jackson will report confidence in self management of condition at time of discharge with advanced HEP  Evaluation/Baseline: unable to self manage Goal status: INITIAL   6.  Adriana Jackson will self report >/= 50% decrease in pain from evaluation   Evaluation/Baseline: 9/10 max pain Goal status: INITIAL   PLAN: PT FREQUENCY: 1-2x/week  PT DURATION: 8 weeks  PLANNED INTERVENTIONS: Therapeutic exercises, Aquatic therapy, Therapeutic activity, Neuro Muscular re-education, Gait training, Patient/Family education, Joint mobilization, Dry Needling, Electrical stimulation, Spinal mobilization and/or manipulation, Moist heat, Taping, Vasopneumatic device, Ionotophoresis 4mg /ml Dexamethasone, and Manual therapy  Berta Minor PTA 07/13/2023, 1:06 PM

## 2023-07-13 ENCOUNTER — Ambulatory Visit: Payer: MEDICAID

## 2023-07-13 DIAGNOSIS — M6281 Muscle weakness (generalized): Secondary | ICD-10-CM

## 2023-07-13 DIAGNOSIS — M25562 Pain in left knee: Secondary | ICD-10-CM | POA: Diagnosis not present

## 2023-07-13 DIAGNOSIS — G8929 Other chronic pain: Secondary | ICD-10-CM

## 2023-07-20 ENCOUNTER — Ambulatory Visit: Payer: MEDICAID | Admitting: Physical Therapy

## 2023-07-27 ENCOUNTER — Ambulatory Visit: Payer: MEDICAID | Attending: Physical Medicine and Rehabilitation

## 2023-07-27 DIAGNOSIS — M6281 Muscle weakness (generalized): Secondary | ICD-10-CM

## 2023-07-27 DIAGNOSIS — G8929 Other chronic pain: Secondary | ICD-10-CM | POA: Diagnosis present

## 2023-07-27 DIAGNOSIS — M25562 Pain in left knee: Secondary | ICD-10-CM | POA: Insufficient documentation

## 2023-07-27 DIAGNOSIS — M25561 Pain in right knee: Secondary | ICD-10-CM | POA: Insufficient documentation

## 2023-07-27 NOTE — Therapy (Signed)
Daily Note  Patient Name: Adriana Jackson MRN: [id number] DOB:[birth year], 22 y.o., adult Today's Date: 07/27/2023   PT End of Session - 07/27/23 1230     Visit Number 4    Date for PT Re-Evaluation 08/21/23    Authorization Type [employer] - FOTO    PT Start Time 1230    PT Stop Time 1310    PT Time Calculation (min) 40 min    Activity Tolerance Patient tolerated treatment well    Behavior During Therapy [facility] for tasks assessed/performed              Past Medical History:  Diagnosis Date   Anal fissure    Anxiety 03/30/2021   Asthma    Bipolar 1 disorder (HCC)    Depression 03/30/2021   Past Surgical History:  Procedure Laterality Date   COLONOSCOPY     Patient Active Problem List   Diagnosis Date Noted   Severe episode of recurrent major depressive disorder, with psychotic features (HCC)    Vitamin D deficiency 09/15/2020    PCP: Allwardt, Crist Infante, PA-C  REFERRING PROVIDER: Horton Chin, MD  THERAPY DIAG:  Chronic pain of left knee  Chronic pain of right knee  Muscle weakness  REFERRING DIAG: Arthralgia of both lower legs [M25.561, M25.562]   Rationale for Evaluation and Treatment:  Rehabilitation  SUBJECTIVE:  PERTINENT PAST HISTORY:  Depression, bipolar, fibromyalgia?, asthma      PRECAUTIONS: None  WEIGHT BEARING RESTRICTIONS No  FALLS:  Has patient fallen in last 6 months? No, Number of falls: 0  MOI/History of condition:  Onset date: 3-4 years  SUBJECTIVE STATEMENT Patient reports mild BIL knee pain today, L>R.   Red flags:  denies   Pain:  Are you having pain? Yes Pain location: diffuse knee and peripatellar pain NPRS scale:  5/10 Aggravating factors: walking, steps, sitting for long periods, standing for long periods Relieving factors: lying down Pain description: constant and aching Stage: Chronic 24 hour pattern: worse first thing in the morning   Occupation: NA  Assistive Device: NA  Hand Dominance:  NA  Patient Goals/Specific Activities:   Patient Specific Functional Scale:  Activity Eval         walking 8         Sitting for extended periods 5         Going down stairs 3                   Average 5.33          (Activities rated 0-10/10.  "10" represents "able to perform at prior level" while "0" represents "unable to perform." )   OBJECTIVE:   DIAGNOSTIC FINDINGS:  NA   GENERAL OBSERVATION/GAIT:   WNL  SENSATION:  Light touch: Appears intact  PALPATION: Pain with patellar movement with excessive motion R>L   LE MMT:  MMT Right (Eval) Left (Eval)  Hip flexion (L2, L3) 4+ 4  Knee extension (L3) 4+ 4  Knee flexion 4+ 4  Hip abduction 4 4  Hip extension 4 4  Hip external rotation    Hip internal rotation    Hip adduction    Ankle dorsiflexion (L4)    Ankle plantarflexion (S1)    Ankle inversion    Ankle eversion    Great Toe ext (L5)    Grossly     (Blank rows = not tested, score listed is out of 5 possible points.  N = WNL, D = diminished, C =  clear for gross weakness with myotome testing, * = concordant pain with testing)  LE ROM:  ROM Right (Eval) Left (Eval)  Hip flexion    Hip extension    Hip abduction    Hip adduction    Hip internal rotation    Hip external rotation    Knee extension n n  Knee flexion n n  Ankle dorsiflexion    Ankle plantarflexion    Ankle inversion    Ankle eversion     (Blank rows = not tested, N = WNL, * = concordant pain with testing)  Functional Tests  Eval     30'' STS: 8x  UE used? N     30'' step up 8'': L 9x, R 8x                                                      SPECIAL TESTS:   Patellar grind (+)   PATIENT SURVEYS:  FOTO 47 - > 65   TODAY'S TREATMENT: [facility] Adult PT Treatment:                                                DATE: 07/27/23 Aquatic therapy at [facility]- [address] - therapeutic pool temp approximately 92 degrees. Pt enters building ambulating  independently. Treatment took place in water 3.8 to  4 ft 8 in. deep depending upon activity.  Pt entered and exited the pool via stair and handrails independently.  Aquatic Exercise: Walking forward/backwards/side stepping Sidestepping rainbow DB shoulder abd/add x2 laps Step ups on bottom step fwd/lat x10 ea BIL (lat on L too painful) Runners stretch on bottom step x30" BIL Hamstring stretch on bottom step x30" BIL Standing with UE support edge of pool: Hip abd/add x15 BIL Hamstring curl x20 BIL Heel/toe raise 2x15 Squats 2x15 Hip ext/flex with knee straight x 15 BIL Hip Circles CC/CCW x10 each BIL Marching hip flexion to knee extension x10 BIL (mild pain in Lt knee) Sitting on submerged bench: Alternating LAQ x1' Bicycle kicks x1' Scissor kicks x1'  Pt requires the buoyancy of water for active assisted exercises with buoyancy supported for strengthening and AROM exercises. Hydrostatic pressure also supports joints by unweighting joint load by at least 50 % in 3-4 feet depth water. 80% in chest to neck deep water. Water will provide assistance with movement using the current and laminar flow while the buoyancy reduces weight bearing. Pt requires the viscosity of the water for resistance with strengthening exercises.  [facility] Adult PT Treatment:                                                DATE: 07/13/23 Aquatic therapy at [facility]- [address] - therapeutic pool temp approximately 92 degrees. Pt enters building ambulating independently. Treatment took place in water 3.8 to  4 ft 8 in. deep depending upon activity.  Pt entered and exited the pool via stair and handrails independently. Patient entered water for aquatic therapy for first time and was introduced to principles and therapeutic effects of water as  they ambulated and acclimated to pool.  Aquatic Exercise: Walking forward/backwards/side stepping Sidestepping rainbow DB shoulder abd/add x2 laps Runners stretch on  bottom step x30" BIL Hamstring stretch on bottom step x30" BIL Figure 4 squat stretch, BIL UE support x30" BIL Standing with UE support edge of pool: Hip abd/add x15 BIL Hip ext/flex with knee straight x 15 BIL Hip Circles CC/CCW x10 each BIL Marching hip flexion to knee extension x15 BIL Hamstring curl x20 BIL Heel/toe raise 2x15 Squats 2x15 Sitting on submerged bench: Alternating LAQ x1' Bicycle kicks x1' Scissor kicks x1' Kickboard push/pull x1' Kickboard push downs x1'  Pt requires the buoyancy of water for active assisted exercises with buoyancy supported for strengthening and AROM exercises. Hydrostatic pressure also supports joints by unweighting joint load by at least 50 % in 3-4 feet depth water. 80% in chest to neck deep water. Water will provide assistance with movement using the current and laminar flow while the buoyancy reduces weight bearing. Pt requires the viscosity of the water for resistance with strengthening exercises.   [facility] Adult PT Treatment:  Therapeutic Exercise:  Bike 5 min SLR - 2x10 Side clam - RTB Supine bride with pball - 2x10 (normal bridge d/c d/t knee pain) Prone hip ext - 2x10 Rocker board DF/PF - 2' Decline mini squat - 2x10 Heel raise - 2x10  HOME EXERCISE PROGRAM: Access Code: [email or web address] URL: [email or web address] Date: 06/26/2023 Prepared by: Alphonzo Severance  Exercises - Supine Quad Set  - 3 x daily - 7 x weekly - 2 sets - 10 reps - 10 second hold - Supine Active Straight Leg Raise  - 1 x daily - 7 x weekly - 3 sets - 10 reps - Hooklying Isometric Clamshell  - 1 x daily - 7 x weekly - 3 sets - 10 reps  Treatment priorities   Eval        General knee and hip strengthening        Look at balance        Aquatic therapy for general activity and fibromyalgia pain management                           ASSESSMENT:  CLINICAL IMPRESSION: Patient presents to aquatic PT session reporting mild pain in BIL knees, L>R and  that they had increased overall fatigue after the last aquatic session. Session today focused on LE strengthening and increasing standing activity tolerance in the aquatic environment for use of buoyancy to offload joints and the viscosity of water as resistance during therapeutic exercise. They endorse mild pain in the Lt knee in particular with flexion activities.  Patient continues to benefit from skilled PT services on land and aquatic based and should be progressed as able to improve functional independence.    OBJECTIVE IMPAIRMENTS: Pain, knee and hip strength  ACTIVITY LIMITATIONS: steps, sitting, standing, walking  PERSONAL FACTORS: See medical history and pertinent history   REHAB POTENTIAL: Good  CLINICAL DECISION MAKING: Stable/uncomplicated  EVALUATION COMPLEXITY: Low   GOALS:   SHORT TERM GOALS: Target date: 07/24/2023   [name] will be >75% HEP compliant to improve carryover between sessions and facilitate independent management of condition  Evaluation: ongoing Goal status: MET Pt reports adherence 07/13/23   LONG TERM GOALS: Target date: 08/21/2023   [name] will improve FOTO score to 65 as a proxy for functional improvement  Evaluation/Baseline: 47 Goal status: INITIAL    2.  [name] will show a >/= 2  pt improvement in their average PSFS score as a proxy for functional improvement   Evaluation/Baseline:  Patient Specific Functional Scale:  Activity Eval         walking 8         Sitting for extended periods 5         Going down stairs 3                   Average 5.33          (Activities rated 0-10/10.  "10" represents "able to perform at prior level" while "0" represents "unable to perform." )  Minimum detectable change (90%CI) for average score = 2 points Minimum detectable change (90%CI) for single activity score = 3 points   Goal status: INITIAL    3.  [name] will improve reps of 30'' step-up to 8'' box to >/= 11x (bilateral LE(s)) to show  improved LE strength and ability to navigate steps   Evaluation/Baseline: L 9x, R 8x Goal status: INITIAL    4.  [name] will improve 30'' STS (MCID 2) to >/= 10x (w/ UE?: n) to show improved LE strength and improved transfers   Evaluation/Baseline: 8x  w/ UE? n Goal status: INITIAL   5.  [name] will report confidence in self management of condition at time of discharge with advanced HEP  Evaluation/Baseline: unable to self manage Goal status: INITIAL   6.  [name] will self report >/= 50% decrease in pain from evaluation   Evaluation/Baseline: 9/10 max pain Goal status: INITIAL   PLAN: PT FREQUENCY: 1-2x/week  PT DURATION: 8 weeks  PLANNED INTERVENTIONS: Therapeutic exercises, Aquatic therapy, Therapeutic activity, Neuro Muscular re-education, Gait training, Patient/Family education, Joint mobilization, Dry Needling, Electrical stimulation, Spinal mobilization and/or manipulation, Moist heat, Taping, Vasopneumatic device, Ionotophoresis 4mg /ml Dexamethasone, and Manual therapy  Berta Minor PTA 07/27/2023, 1:11 PM

## 2023-07-28 ENCOUNTER — Other Ambulatory Visit: Payer: Self-pay | Admitting: Physician Assistant

## 2023-08-03 ENCOUNTER — Ambulatory Visit: Payer: MEDICAID | Admitting: Physical Therapy

## 2023-08-03 ENCOUNTER — Encounter: Payer: Self-pay | Admitting: Physical Therapy

## 2023-08-03 ENCOUNTER — Telehealth: Payer: Self-pay | Admitting: Physical Therapy

## 2023-08-03 DIAGNOSIS — G8929 Other chronic pain: Secondary | ICD-10-CM

## 2023-08-03 DIAGNOSIS — M25562 Pain in left knee: Secondary | ICD-10-CM | POA: Diagnosis not present

## 2023-08-03 NOTE — Therapy (Unsigned)
Daily Note  Patient Name: Adriana Jackson MRN: [id number] DOB:01/30/2001, 22 y.o., adult Today's Date: 08/04/2023   PT End of Session - 08/03/23 0940     Visit Number 5    Date for PT Re-Evaluation 08/21/23    Authorization Type [employer] - FOTO    PT Start Time 0940   pt arrived late   PT Stop Time 1010    PT Time Calculation (min) 30 min    Activity Tolerance Patient tolerated treatment well    Behavior During Therapy [facility] for tasks assessed/performed              Past Medical History:  Diagnosis Date   Anal fissure    Anxiety 03/30/2021   Asthma    Bipolar 1 disorder (HCC)    Depression 03/30/2021   Past Surgical History:  Procedure Laterality Date   COLONOSCOPY     Patient Active Problem List   Diagnosis Date Noted   Severe episode of recurrent major depressive disorder, with psychotic features (HCC)    Vitamin D deficiency 09/15/2020    PCP: Allwardt, Crist Infante, PA-C  REFERRING PROVIDER: Horton Chin, MD  THERAPY DIAG:  Chronic pain of left knee  Chronic pain of right knee  REFERRING DIAG: Arthralgia of both lower legs [M25.561, M25.562]   Rationale for Evaluation and Treatment:  Rehabilitation  SUBJECTIVE:  PERTINENT PAST HISTORY:  Depression, bipolar, fibromyalgia?, asthma      PRECAUTIONS: None  WEIGHT BEARING RESTRICTIONS No  FALLS:  Has patient fallen in last 6 months? No, Number of falls: 0  MOI/History of condition:  Onset date: 3-4 years  SUBJECTIVE STATEMENT Pt reports that they have been having more L than R knee pain recently.  They rate their L knee pain 5/10 with minimal pain in R knee.   Red flags:  denies   Pain:  Are you having pain? Yes Pain location: diffuse knee and peripatellar pain NPRS scale:  5/10 Aggravating factors: walking, steps, sitting for long periods, standing for long periods Relieving factors: lying down Pain description: constant and aching Stage: Chronic 24 hour pattern: worse first thing  in the morning   Occupation: NA  Assistive Device: NA  Hand Dominance: NA  Patient Goals/Specific Activities:   Patient Specific Functional Scale:  Activity Eval         walking 8         Sitting for extended periods 5         Going down stairs 3                   Average 5.33          (Activities rated 0-10/10.  "10" represents "able to perform at prior level" while "0" represents "unable to perform." )   OBJECTIVE:   DIAGNOSTIC FINDINGS:  NA   GENERAL OBSERVATION/GAIT:   WNL  SENSATION:  Light touch: Appears intact  PALPATION: Pain with patellar movement with excessive motion R>L   LE MMT:  MMT Right (Eval) Left (Eval)  Hip flexion (L2, L3) 4+ 4  Knee extension (L3) 4+ 4  Knee flexion 4+ 4  Hip abduction 4 4  Hip extension 4 4  Hip external rotation    Hip internal rotation    Hip adduction    Ankle dorsiflexion (L4)    Ankle plantarflexion (S1)    Ankle inversion    Ankle eversion    Great Toe ext (L5)    Grossly     (  Blank rows = not tested, score listed is out of 5 possible points.  N = WNL, D = diminished, C = clear for gross weakness with myotome testing, * = concordant pain with testing)  LE ROM:  ROM Right (Eval) Left (Eval)  Hip flexion    Hip extension    Hip abduction    Hip adduction    Hip internal rotation    Hip external rotation    Knee extension n n  Knee flexion n n  Ankle dorsiflexion    Ankle plantarflexion    Ankle inversion    Ankle eversion     (Blank rows = not tested, N = WNL, * = concordant pain with testing)  Functional Tests  Eval     30'' STS: 8x  UE used? N     30'' step up 8'': L 9x, R 8x                                                      SPECIAL TESTS:   Patellar grind (+)   PATIENT SURVEYS:  FOTO 47 - > 65   TODAY'S TREATMENT:  [facility] Adult PT Treatment:  08/03/23  Therapeutic Exercise:  Elliptical - 5'  TKE - Blue band 2x15 LAQ with YTB Retro TM walking  SLS on foam  - 45'' bouts  [facility] Adult PT Treatment:                                                DATE: 07/27/23 Aquatic therapy at [facility]- [address] - therapeutic pool temp approximately 92 degrees. Pt enters building ambulating independently. Treatment took place in water 3.8 to  4 ft 8 in. deep depending upon activity.  Pt entered and exited the pool via stair and handrails independently.  Aquatic Exercise: Walking forward/backwards/side stepping Sidestepping rainbow DB shoulder abd/add x2 laps Step ups on bottom step fwd/lat x10 ea BIL (lat on L too painful) Runners stretch on bottom step x30" BIL Hamstring stretch on bottom step x30" BIL Standing with UE support edge of pool: Hip abd/add x15 BIL Hamstring curl x20 BIL Heel/toe raise 2x15 Squats 2x15 Hip ext/flex with knee straight x 15 BIL Hip Circles CC/CCW x10 each BIL Marching hip flexion to knee extension x10 BIL (mild pain in Lt knee) Sitting on submerged bench: Alternating LAQ x1' Bicycle kicks x1' Scissor kicks x1'  Pt requires the buoyancy of water for active assisted exercises with buoyancy supported for strengthening and AROM exercises. Hydrostatic pressure also supports joints by unweighting joint load by at least 50 % in 3-4 feet depth water. 80% in chest to neck deep water. Water will provide assistance with movement using the current and laminar flow while the buoyancy reduces weight bearing. Pt requires the viscosity of the water for resistance with strengthening exercises.  [facility] Adult PT Treatment:                                                DATE: 07/13/23 Aquatic therapy at [facility]- [address] - therapeutic pool temp  approximately 92 degrees. Pt enters building ambulating independently. Treatment took place in water 3.8 to  4 ft 8 in. deep depending upon activity.  Pt entered and exited the pool via stair and handrails independently. Patient entered water for aquatic therapy for first time and was  introduced to principles and therapeutic effects of water as they ambulated and acclimated to pool.  Aquatic Exercise: Walking forward/backwards/side stepping Sidestepping rainbow DB shoulder abd/add x2 laps Runners stretch on bottom step x30" BIL Hamstring stretch on bottom step x30" BIL Figure 4 squat stretch, BIL UE support x30" BIL Standing with UE support edge of pool: Hip abd/add x15 BIL Hip ext/flex with knee straight x 15 BIL Hip Circles CC/CCW x10 each BIL Marching hip flexion to knee extension x15 BIL Hamstring curl x20 BIL Heel/toe raise 2x15 Squats 2x15 Sitting on submerged bench: Alternating LAQ x1' Bicycle kicks x1' Scissor kicks x1' Kickboard push/pull x1' Kickboard push downs x1'  Pt requires the buoyancy of water for active assisted exercises with buoyancy supported for strengthening and AROM exercises. Hydrostatic pressure also supports joints by unweighting joint load by at least 50 % in 3-4 feet depth water. 80% in chest to neck deep water. Water will provide assistance with movement using the current and laminar flow while the buoyancy reduces weight bearing. Pt requires the viscosity of the water for resistance with strengthening exercises.   [facility] Adult PT Treatment:  Therapeutic Exercise:  Bike 5 min SLR - 2x10 Side clam - RTB Supine bride with pball - 2x10 (normal bridge d/c d/t knee pain) Prone hip ext - 2x10 Rocker board DF/PF - 2' Decline mini squat - 2x10 Heel raise - 2x10  HOME EXERCISE PROGRAM: Access Code: [email or web address] URL: [email or web address] Date: 08/03/2023 Prepared by: Alphonzo Severance  Exercises - Supine Active Straight Leg Raise  - 1 x daily - 7 x weekly - 3 sets - 10 reps - Clam with Resistance  - 1 x daily - 7 x weekly - 3 sets - 10 reps - Prone Hip Extension  - 1 x daily - 7 x weekly - 3 sets - 10 reps - Seated Knee Extension with Resistance  - 1 x daily - 7 x weekly - 2-3 sets - 10 reps - Standing Terminal Knee  Extension with Resistance  - 1 x daily - 7 x weekly - 3 sets - 10 reps  Treatment priorities   Eval        General knee and hip strengthening        Look at balance        Aquatic therapy for general activity and fibromyalgia pain management                           ASSESSMENT:  CLINICAL IMPRESSION: D/t check in confusion, visit was truncated.  [name] is doing well overall with significantly lower pain in the R knee.  They continue to have pain in the L knee, though this is somewhat improve.  We added in more direct quad strengthening today and updated HEP.  LE strength deficit should continue to be focus.    OBJECTIVE IMPAIRMENTS: Pain, knee and hip strength  ACTIVITY LIMITATIONS: steps, sitting, standing, walking  PERSONAL FACTORS: See medical history and pertinent history   REHAB POTENTIAL: Good  CLINICAL DECISION MAKING: Stable/uncomplicated  EVALUATION COMPLEXITY: Low   GOALS:   SHORT TERM GOALS: Target date: 07/24/2023   [name] will be >75% HEP compliant to improve carryover  between sessions and facilitate independent management of condition  Evaluation: ongoing Goal status: MET Pt reports adherence 07/13/23   LONG TERM GOALS: Target date: 08/21/2023   [name] will improve FOTO score to 65 as a proxy for functional improvement  Evaluation/Baseline: 47 Goal status: INITIAL    2.  [name] will show a >/= 2 pt improvement in their average PSFS score as a proxy for functional improvement   Evaluation/Baseline:  Patient Specific Functional Scale:  Activity Eval         walking 8         Sitting for extended periods 5         Going down stairs 3                   Average 5.33          (Activities rated 0-10/10.  "10" represents "able to perform at prior level" while "0" represents "unable to perform." )  Minimum detectable change (90%CI) for average score = 2 points Minimum detectable change (90%CI) for single activity score = 3 points   Goal  status: INITIAL    3.  [name] will improve reps of 30'' step-up to 8'' box to >/= 11x (bilateral LE(s)) to show improved LE strength and ability to navigate steps   Evaluation/Baseline: L 9x, R 8x Goal status: INITIAL    4.  [name] will improve 30'' STS (MCID 2) to >/= 10x (w/ UE?: n) to show improved LE strength and improved transfers   Evaluation/Baseline: 8x  w/ UE? n Goal status: INITIAL   5.  [name] will report confidence in self management of condition at time of discharge with advanced HEP  Evaluation/Baseline: unable to self manage Goal status: INITIAL   6.  [name] will self report >/= 50% decrease in pain from evaluation   Evaluation/Baseline: 9/10 max pain Goal status: INITIAL   PLAN: PT FREQUENCY: 1-2x/week  PT DURATION: 8 weeks  PLANNED INTERVENTIONS: Therapeutic exercises, Aquatic therapy, Therapeutic activity, Neuro Muscular re-education, Gait training, Patient/Family education, Joint mobilization, Dry Needling, Electrical stimulation, Spinal mobilization and/or manipulation, Moist heat, Taping, Vasopneumatic device, Ionotophoresis 4mg /ml Dexamethasone, and Manual therapy  Kimberlee Nearing Malachi Kinzler PT 08/04/2023, 7:57 AM

## 2023-08-03 NOTE — Telephone Encounter (Addendum)
Created in error

## 2023-08-07 ENCOUNTER — Encounter: Payer: MEDICAID | Attending: Physical Medicine and Rehabilitation | Admitting: Physical Medicine and Rehabilitation

## 2023-08-07 VITALS — BP 114/81 | HR 94 | Ht 68.0 in | Wt 132.0 lb

## 2023-08-07 DIAGNOSIS — M25562 Pain in left knee: Secondary | ICD-10-CM | POA: Insufficient documentation

## 2023-08-07 DIAGNOSIS — M542 Cervicalgia: Secondary | ICD-10-CM | POA: Diagnosis not present

## 2023-08-07 DIAGNOSIS — M25561 Pain in right knee: Secondary | ICD-10-CM | POA: Insufficient documentation

## 2023-08-07 DIAGNOSIS — R5383 Other fatigue: Secondary | ICD-10-CM | POA: Diagnosis present

## 2023-08-07 MED ORDER — VITAMIN D (ERGOCALCIFEROL) 1.25 MG (50000 UNIT) PO CAPS: 50000.0000 [IU] | ORAL_CAPSULE | ORAL | 0 refills | Status: DC

## 2023-08-07 MED ORDER — PREGABALIN 25 MG PO CAPS: 25.0000 mg | ORAL_CAPSULE | Freq: Every evening | ORAL | 3 refills | Status: DC

## 2023-08-07 NOTE — Progress Notes (Signed)
Subjective:    Patient ID: Adriana Jackson, adult    DOB: [birth year], 22 y.o.   MRN: [id number]  HPI Adriana Jackson is a 22 year old woman who presents for follow-up of diffuse joint pain.  1) Diffuse joint pain: -knees, ankles, and wrists are affected -she is interested in medications for her pain -she did exercises and these helped with her knees -has never tried a tens unit -she found benefits from cymbalta but it caused her to have hot flashes -she has been sleeping well at night -PT and pain improved  2) Fatigue:  -fatigue has been present since the onset of her pain.  -continues  3) Cervical spine pain:  -radiates into hand on right side -she would be interested in PT for this   Pain Inventory Average Pain 6 Pain Right Now 7 My pain is intermittent, tingling, and aching  In the last 24 hours, has pain interfered with the following? General activity 4 Relation with others 2 Enjoyment of life 6 What TIME of day is your pain at its worst? evening Sleep (in general) Fair  Pain is worse with: walking, bending, and sitting Pain improves with: rest and therapy/exercise Relief from Meds: 5     Family History  Problem Relation Age of Onset   Asthma Mother    Irritable bowel syndrome Mother    Fibromyalgia Mother    Osteoporosis Mother    Hypothyroidism Mother    Allergic rhinitis Father    Rheum arthritis Father    Hypertension Father    Multiple sclerosis Sister    Colon cancer Neg Hx    Esophageal cancer Neg Hx    Social History   Socioeconomic History   Marital status: Single    Spouse name: Not on file   Number of children: Not on file   Years of education: Not on file   Highest education level: Not on file  Occupational History   Not on file  Tobacco Use   Smoking status: Never    Passive exposure: Past   Smokeless tobacco: Never  Vaping Use   Vaping status: Never Used  Substance and Sexual Activity   Alcohol use: Not Currently    Comment: 4  shots/day   Drug use: Not Currently    Types: Marijuana    Comment: delta 8   Sexual activity: Yes    Birth control/protection: Implant  Other Topics Concern   Not on file  Social History Narrative   Not on file   Social Determinants of Health   Financial Resource Strain: Not on file  Food Insecurity: Not on file  Transportation Needs: Not on file  Physical Activity: Not on file  Stress: Not on file  Social Connections: Not on file   Past Surgical History:  Procedure Laterality Date   COLONOSCOPY     Past Medical History:  Diagnosis Date   Anal fissure    Anxiety 03/30/2021   Asthma    Bipolar 1 disorder (HCC)    Depression 03/30/2021   BP 114/81   Pulse 94   Ht 5\' 8"  (1.727 m)   Wt 132 lb (59.9 kg)   SpO2 98%   BMI 20.07 kg/m   Opioid Risk Score:   Fall Risk Score:  `1  Depression screen [facility] 2/9     05/08/2023   11:23 AM 01/19/2023   10:05 AM 11/03/2022    8:32 AM 07/12/2022    1:30 PM 04/06/2022    8:59 AM 12/07/2021  1:15 PM 03/30/2021    2:50 AM  Depression screen PHQ 2/9  Decreased Interest 0 1 1 1  0 1 3  Down, Depressed, Hopeless 0 0 1 1 0 1 3  PHQ - 2 Score 0 1 2 2  0 2 6  Altered sleeping 2 0 1 1 0 2 3  Tired, decreased energy 3 2 1 1 2 2 3   Change in appetite 1 0 1 1 0 2 3  Feeling bad or failure about yourself  0 0 0 0 0 1 3  Trouble concentrating 1 0 1 1 0 3 2  Moving slowly or fidgety/restless 0 0 0 0 0 0 2  Suicidal thoughts 0 0 0 0 0 0 2  PHQ-9 Score 7 3 6 6 2 12 24   Difficult doing work/chores Not difficult at all Not difficult at all Not difficult at all Not difficult at all Not difficult at all  Very difficult     Review of Systems  Constitutional:  Positive for appetite change.  Gastrointestinal:  Positive for nausea.  Musculoskeletal:        Bilateral wrist pain Bilateral knee pain Bilateral ankle pain  Neurological:  Positive for dizziness and weakness.  All other systems reviewed and are negative.      Objective:    Physical Exam PRIOR EXAM: Gen: no distress, normal appearing HEENT: oral mucosa pink and moist, NCAT Cardio: Reg rate Chest: normal effort, normal rate of breathing Abd: soft, non-distended Ext: no edema Psych: pleasant, normal affect Skin: intact Neuro: Alert and oriented x3, strength 4/5 in right hand grip      Assessment & Plan:   1) Chronic joint pain secondary to fibromyalgia: -Discussed current symptoms of pain and history of pain.   -cymbalta 20mg  daily ordered but this caused hot flashes -lyrica 25mg  HS ordered, refilled  -continue PT  Prescribing Home Zynex NexWave Stimulator Device and supplies as needed. IFC, NMES and TENS medically necessary Treatment Rx: Daily @ 30-40 minutes per treatment PRN. Zynex NexWave only, no substitutions. Treatment Goals: 1) To reduce and/or eliminate pain 2) To improve functional capacity and Activities of daily living 3) To reduce or prevent the need for oral medications 4) To improve circulation in the injured region 5) To decrease or prevent muscle spasm and muscle atrophy 6) To provide a self-management tool to the patient The patient has not sufficiently improved with conservative care. Numerous studies indexed by Medline and PubMed.gov have shown Neuromuscular, Interferential, and TENS stimulators to reduce pain, improve function, and reduce medication use in injured patients. Continued use of this evidence based, safe, drug free treatment is both reasonable and medically necessary at this time.   -Discussed benefits of exercise in reducing pain. -Discussed following foods that may reduce pain: 1) Ginger (especially studied for arthritis)- reduce leukotriene production to decrease inflammation 2) Blueberries- high in phytonutrients that decrease inflammation 3) Salmon- marine omega-3s reduce joint swelling and pain 4) Pumpkin seeds- reduce inflammation 5) dark chocolate- reduces inflammation 6) turmeric- reduces inflammation 7) tart  cherries - reduce pain and stiffness 8) extra virgin olive oil - its compound olecanthal helps to block prostaglandins  9) chili peppers- can be eaten or applied topically via capsaicin 10) mint- helpful for headache, muscle aches, joint pain, and itching 11) garlic- reduces inflammation  Link to further information on diet for chronic pain: [email or web address]   2) Fatigue: -vitamin D level ordered for today  -high dose vitamin Dsupplement ordered as well  3) Cervical pain/bilateral  wrist pain: -XR cervical spine ordered -bilateral wrist braces ordered for night

## 2023-08-08 LAB — VITAMIN D 25 HYDROXY (VIT D DEFICIENCY, FRACTURES): Vit D, 25-Hydroxy: 38.8 ng/mL (ref 30.0–100.0)

## 2023-08-10 ENCOUNTER — Ambulatory Visit: Payer: MEDICAID

## 2023-08-10 DIAGNOSIS — M6281 Muscle weakness (generalized): Secondary | ICD-10-CM

## 2023-08-10 DIAGNOSIS — M25562 Pain in left knee: Secondary | ICD-10-CM | POA: Diagnosis not present

## 2023-08-10 DIAGNOSIS — G8929 Other chronic pain: Secondary | ICD-10-CM

## 2023-08-10 NOTE — Therapy (Signed)
Daily Note  Patient Name: Adriana Jackson MRN: [id number] DOB:2001-07-19, 22 y.o., adult Today's Date: 08/10/2023   PT End of Session - 08/10/23 1222     Visit Number 6    Date for PT Re-Evaluation 08/21/23    Authorization Type [employer] - FOTO    PT Start Time 1222    PT Stop Time 1303    PT Time Calculation (min) 41 min    Activity Tolerance Patient tolerated treatment well    Behavior During Therapy [facility] for tasks assessed/performed               Past Medical History:  Diagnosis Date   Anal fissure    Anxiety 03/30/2021   Asthma    Bipolar 1 disorder (HCC)    Depression 03/30/2021   Past Surgical History:  Procedure Laterality Date   COLONOSCOPY     Patient Active Problem List   Diagnosis Date Noted   Severe episode of recurrent major depressive disorder, with psychotic features (HCC)    Vitamin D deficiency 09/15/2020    PCP: Allwardt, Crist Infante, PA-C  REFERRING PROVIDER: Horton Chin, MD  THERAPY DIAG:  Chronic pain of left knee  Chronic pain of right knee  Muscle weakness  REFERRING DIAG: Arthralgia of both lower legs [M25.561, M25.562]   Rationale for Evaluation and Treatment:  Rehabilitation  SUBJECTIVE:  PERTINENT PAST HISTORY:  Depression, bipolar, fibromyalgia?, asthma      PRECAUTIONS: None  WEIGHT BEARING RESTRICTIONS No  FALLS:  Has patient fallen in last 6 months? No, Number of falls: 0  MOI/History of condition:  Onset date: 3-4 years  SUBJECTIVE STATEMENT Patient reports their ankles are hurting more today, rates at 5/10, and that their knees are feeling better today.   Red flags:  denies   Pain:  Are you having pain? Yes Pain location: diffuse knee and peripatellar pain NPRS scale:  5/10 Aggravating factors: walking, steps, sitting for long periods, standing for long periods Relieving factors: lying down Pain description: constant and aching Stage: Chronic 24 hour pattern: worse first thing in the morning    Occupation: NA  Assistive Device: NA  Hand Dominance: NA  Patient Goals/Specific Activities:   Patient Specific Functional Scale:  Activity Eval         walking 8         Sitting for extended periods 5         Going down stairs 3                   Average 5.33          (Activities rated 0-10/10.  "10" represents "able to perform at prior level" while "0" represents "unable to perform." )   OBJECTIVE:   DIAGNOSTIC FINDINGS:  NA   GENERAL OBSERVATION/GAIT:   WNL  SENSATION:  Light touch: Appears intact  PALPATION: Pain with patellar movement with excessive motion R>L   LE MMT:  MMT Right (Eval) Left (Eval)  Hip flexion (L2, L3) 4+ 4  Knee extension (L3) 4+ 4  Knee flexion 4+ 4  Hip abduction 4 4  Hip extension 4 4  Hip external rotation    Hip internal rotation    Hip adduction    Ankle dorsiflexion (L4)    Ankle plantarflexion (S1)    Ankle inversion    Ankle eversion    Great Toe ext (L5)    Grossly     (Blank rows = not tested, score listed is out  of 5 possible points.  N = WNL, D = diminished, C = clear for gross weakness with myotome testing, * = concordant pain with testing)  LE ROM:  ROM Right (Eval) Left (Eval)  Hip flexion    Hip extension    Hip abduction    Hip adduction    Hip internal rotation    Hip external rotation    Knee extension n n  Knee flexion n n  Ankle dorsiflexion    Ankle plantarflexion    Ankle inversion    Ankle eversion     (Blank rows = not tested, N = WNL, * = concordant pain with testing)  Functional Tests  Eval     30'' STS: 8x  UE used? N     30'' step up 8'': L 9x, R 8x                                                      SPECIAL TESTS:   Patellar grind (+)   PATIENT SURVEYS:  FOTO 47 - > 65   TODAY'S TREATMENT: [facility] Adult PT Treatment:                                                DATE: 08/10/23 Aquatic therapy at [facility]- [address] - therapeutic pool temp  approximately 92 degrees. Pt enters building ambulating independently. Treatment took place in water 3.8 to  4 ft 8 in. deep depending upon activity.  Pt entered and exited the pool via stair and handrails independently.  Aquatic Exercise: Walking forward/side stepping Sidestepping yellow DB shoulder abd/add x2 laps Step ups on bottom step fwd/lat x10 ea BIL (lat on L too painful) Runners stretch on bottom step x30" BIL Hamstring stretch on bottom step x30" BIL Standing with UE support edge of pool: Hip abd/add x20 BIL Hamstring curl x20 BIL Heel/toe raise 2x15 Squats 2x20 Hip ext/flex with knee straight x 20 BIL Hip Circles CC/CCW x10 each BIL Cat/cow with pool noodle x10 Warrior I pool noodle lift x10 BIL   Pt requires the buoyancy of water for active assisted exercises with buoyancy supported for strengthening and AROM exercises. Hydrostatic pressure also supports joints by unweighting joint load by at least 50 % in 3-4 feet depth water. 80% in chest to neck deep water. Water will provide assistance with movement using the current and laminar flow while the buoyancy reduces weight bearing. Pt requires the viscosity of the water for resistance with strengthening exercises.  [facility] Adult PT Treatment:  08/03/23 Therapeutic Exercise: Elliptical - 5'  TKE - Blue band 2x15 LAQ with YTB Retro TM walking  SLS on foam - 45'' bouts   [facility] Adult PT Treatment:                                                DATE: 07/27/23 Aquatic therapy at [facility]- [address] - therapeutic pool temp approximately 92 degrees. Pt enters building ambulating independently. Treatment took place in water 3.8 to  4 ft 8 in. deep depending upon activity.  Pt entered and exited the pool via stair and handrails independently.  Aquatic Exercise: Walking forward/backwards/side stepping Sidestepping rainbow DB shoulder abd/add x2 laps Step ups on bottom step fwd/lat x10 ea BIL (lat on L too painful) Runners  stretch on bottom step x30" BIL Hamstring stretch on bottom step x30" BIL Standing with UE support edge of pool: Hip abd/add x15 BIL Hamstring curl x20 BIL Heel/toe raise 2x15 Squats 2x15 Hip ext/flex with knee straight x 15 BIL Hip Circles CC/CCW x10 each BIL Marching hip flexion to knee extension x10 BIL (mild pain in Lt knee) Sitting on submerged bench: Alternating LAQ x1' Bicycle kicks x1' Scissor kicks x1'  Pt requires the buoyancy of water for active assisted exercises with buoyancy supported for strengthening and AROM exercises. Hydrostatic pressure also supports joints by unweighting joint load by at least 50 % in 3-4 feet depth water. 80% in chest to neck deep water. Water will provide assistance with movement using the current and laminar flow while the buoyancy reduces weight bearing. Pt requires the viscosity of the water for resistance with strengthening exercises.   HOME EXERCISE PROGRAM: Access Code: [email or web address] URL: [email or web address] Date: 08/03/2023 Prepared by: Alphonzo Severance  Exercises - Supine Active Straight Leg Raise  - 1 x daily - 7 x weekly - 3 sets - 10 reps - Clam with Resistance  - 1 x daily - 7 x weekly - 3 sets - 10 reps - Prone Hip Extension  - 1 x daily - 7 x weekly - 3 sets - 10 reps - Seated Knee Extension with Resistance  - 1 x daily - 7 x weekly - 2-3 sets - 10 reps - Standing Terminal Knee Extension with Resistance  - 1 x daily - 7 x weekly - 3 sets - 10 reps  Treatment priorities   Eval        General knee and hip strengthening        Look at balance        Aquatic therapy for general activity and fibromyalgia pain management                           ASSESSMENT:  CLINICAL IMPRESSION: Patient presents to aquatic PT session reporting improvements in their knee pain and some increased ankle pain, have been compliant with the new HEP. Session today focused on LE strengthening with increased repetitions today in the  aquatic environment for use of buoyancy to offload joints and the viscosity of water as resistance during therapeutic exercise. Patient was able to tolerate all prescribed exercises in the aquatic environment with no adverse effects. Patient continues to benefit from skilled PT services on land and aquatic based and should be progressed as able to improve functional independence.    OBJECTIVE IMPAIRMENTS: Pain, knee and hip strength  ACTIVITY LIMITATIONS: steps, sitting, standing, walking  PERSONAL FACTORS: See medical history and pertinent history   REHAB POTENTIAL: Good  CLINICAL DECISION MAKING: Stable/uncomplicated  EVALUATION COMPLEXITY: Low   GOALS:   SHORT TERM GOALS: Target date: 07/24/2023   [name] will be >75% HEP compliant to improve carryover between sessions and facilitate independent management of condition  Evaluation: ongoing Goal status: MET Pt reports adherence 07/13/23   LONG TERM GOALS: Target date: 08/21/2023   [name] will improve FOTO score to 65 as a proxy for functional improvement  Evaluation/Baseline: 47 Goal status: INITIAL    2.  [name] will show a >/= 2 pt improvement in  their average PSFS score as a proxy for functional improvement   Evaluation/Baseline:  Patient Specific Functional Scale:  Activity Eval         walking 8         Sitting for extended periods 5         Going down stairs 3                   Average 5.33          (Activities rated 0-10/10.  "10" represents "able to perform at prior level" while "0" represents "unable to perform." )  Minimum detectable change (90%CI) for average score = 2 points Minimum detectable change (90%CI) for single activity score = 3 points   Goal status: INITIAL    3.  [name] will improve reps of 30'' step-up to 8'' box to >/= 11x (bilateral LE(s)) to show improved LE strength and ability to navigate steps   Evaluation/Baseline: L 9x, R 8x Goal status: INITIAL    4.  [name] will improve  30'' STS (MCID 2) to >/= 10x (w/ UE?: n) to show improved LE strength and improved transfers   Evaluation/Baseline: 8x  w/ UE? n Goal status: INITIAL   5.  [name] will report confidence in self management of condition at time of discharge with advanced HEP  Evaluation/Baseline: unable to self manage Goal status: INITIAL   6.  [name] will self report >/= 50% decrease in pain from evaluation   Evaluation/Baseline: 9/10 max pain Goal status: INITIAL   PLAN: PT FREQUENCY: 1-2x/week  PT DURATION: 8 weeks  PLANNED INTERVENTIONS: Therapeutic exercises, Aquatic therapy, Therapeutic activity, Neuro Muscular re-education, Gait training, Patient/Family education, Joint mobilization, Dry Needling, Electrical stimulation, Spinal mobilization and/or manipulation, Moist heat, Taping, Vasopneumatic device, Ionotophoresis 4mg /ml Dexamethasone, and Manual therapy  Berta Minor PTA 08/10/2023, 1:05 PM

## 2023-08-13 ENCOUNTER — Other Ambulatory Visit (HOSPITAL_COMMUNITY)
Admission: RE | Admit: 2023-08-13 | Discharge: 2023-08-13 | Disposition: A | Discharge status: Home / Self Care | Payer: MEDICAID | Source: Ambulatory Visit | Attending: Physician Assistant | Admitting: Physician Assistant

## 2023-08-13 ENCOUNTER — Encounter (HOSPITAL_BASED_OUTPATIENT_CLINIC_OR_DEPARTMENT_OTHER): Payer: MEDICAID | Admitting: Physical Medicine and Rehabilitation

## 2023-08-13 ENCOUNTER — Ambulatory Visit: Payer: MEDICAID | Admitting: Physician Assistant

## 2023-08-13 VITALS — BP 114/74 | HR 88 | Temp 98.8°F | Ht 68.0 in | Wt 133.6 lb

## 2023-08-13 DIAGNOSIS — M542 Cervicalgia: Secondary | ICD-10-CM | POA: Diagnosis not present

## 2023-08-13 DIAGNOSIS — Z0000 Encounter for general adult medical examination without abnormal findings: Secondary | ICD-10-CM | POA: Diagnosis not present

## 2023-08-13 DIAGNOSIS — Z124 Encounter for screening for malignant neoplasm of cervix: Secondary | ICD-10-CM

## 2023-08-13 DIAGNOSIS — R5383 Other fatigue: Secondary | ICD-10-CM

## 2023-08-13 NOTE — Patient Instructions (Signed)
Health Dept for HPV vaccines  Keep up good work!  I'll message with pap smear results.

## 2023-08-13 NOTE — Progress Notes (Signed)
Patient ID: Shaquelle Hanback, adult    DOB: 2000-11-21, 22 y.o.   MRN: [id number]   Assessment & Plan:  Encounter for screening for cervical cancer -     Cytology - PAP    Age-appropriate screening and counseling performed today. Will check labs and call with results. Preventive measures discussed and printed in AVS for patient.   Patient Counseling: [x]   Nutrition: Stressed importance of moderation in sodium/caffeine intake, saturated fat and cholesterol, caloric balance, sufficient intake of fresh fruits, vegetables, and fiber.  [x]   Stressed the importance of regular exercise.   [x]   Substance Abuse: Discussed cessation/primary prevention of tobacco, alcohol, or other drug use; driving or other dangerous activities under the influence; availability of treatment for abuse.   []   Injury prevention: Discussed safety belts, safety helmets, smoke detector, smoking near bedding or upholstery.   [x]   Sexuality: Discussed sexually transmitted diseases, partner selection, use of condoms, avoidance of unintended pregnancy  and contraceptive alternatives.   [x]   Dental health: Discussed importance of regular tooth brushing, flossing, and dental visits.  [x]   Health maintenance and immunizations reviewed. Please refer to Health maintenance section.        Return in about 1 year (around 08/12/2024) for physical.    Subjective:    Chief Complaint  Patient presents with   Gynecologic Exam    Pt in office for well woman exam and physical; pt has ate granola bar this morning otherwise fasting; no concerns to discuss; this will be patient first Pap Smear;     Gynecologic Exam   Patient is in today for well woman exam.  No prior paps.   Sexually active with men. Using condoms. Has Nexplanon this last year ([facility]). No periods since having the Nexplanon.   No other concerns. Declines labs today.     Past Medical History:  Diagnosis Date   Anal fissure    Anxiety  03/30/2021   Asthma    Bipolar 1 disorder (HCC)    Depression 03/30/2021    Past Surgical History:  Procedure Laterality Date   COLONOSCOPY      Family History  Problem Relation Age of Onset   Asthma Mother    Irritable bowel syndrome Mother    Fibromyalgia Mother    Osteoporosis Mother    Hypothyroidism Mother    Allergic rhinitis Father    Rheum arthritis Father    Hypertension Father    Multiple sclerosis Sister    Colon cancer Neg Hx    Esophageal cancer Neg Hx     Social History   Tobacco Use   Smoking status: Never    Passive exposure: Past   Smokeless tobacco: Never  Vaping Use   Vaping status: Never Used  Substance Use Topics   Alcohol use: Not Currently    Comment: 4 shots/day   Drug use: Not Currently    Types: Marijuana    Comment: delta 8     No Known Allergies  Review of Systems NEGATIVE UNLESS OTHERWISE INDICATED IN HPI      Objective:     BP 114/74 (BP Location: Left Arm, Patient Position: Sitting)   Pulse 88   Temp 98.8 F (37.1 C) (Oral)   Ht 5\' 8"  (1.727 m)   Wt 133 lb 9.6 oz (60.6 kg)   SpO2 94%   BMI 20.31 kg/m   Wt Readings from Last 3 Encounters:  08/13/23 133 lb 9.6 oz (60.6 kg)  08/07/23 132 lb (59.9 kg)  06/01/23 122 lb (55.3 kg)    BP Readings from Last 3 Encounters:  08/13/23 114/74  08/07/23 114/81  06/01/23 100/70     Physical Exam Vitals and nursing note reviewed. Exam conducted with a chaperone present.  Constitutional:      Appearance: Normal appearance. [name] is normal weight. [name] is not toxic-appearing.  HENT:     Head: Normocephalic and atraumatic.     Right Ear: Tympanic membrane, ear canal and external ear normal.     Left Ear: Tympanic membrane, ear canal and external ear normal.     Nose: Nose normal.     Mouth/Throat:     Mouth: Mucous membranes are moist.  Eyes:     Extraocular Movements: Extraocular movements intact.     Conjunctiva/sclera: Conjunctivae normal.     Pupils: Pupils are  equal, round, and reactive to light.  Neck:     Thyroid: No thyroid mass, thyromegaly or thyroid tenderness.  Cardiovascular:     Rate and Rhythm: Normal rate and regular rhythm.     Pulses: Normal pulses.     Heart sounds: Normal heart sounds.  Pulmonary:     Effort: Pulmonary effort is normal.     Breath sounds: Normal breath sounds.  Chest:     Chest wall: No mass.  Breasts:    Right: Normal. No swelling, bleeding, inverted nipple, mass, nipple discharge, skin change or tenderness.     Left: Normal. No swelling, bleeding, inverted nipple, mass, nipple discharge, skin change or tenderness.  Abdominal:     General: Abdomen is flat. Bowel sounds are normal.     Palpations: Abdomen is soft.  Genitourinary:    General: Normal vulva.     Labia:        Right: No rash, tenderness or lesion.        Left: No rash, tenderness or lesion.      Vagina: Normal.     Cervix: Normal.     Uterus: Normal.      Adnexa: Right adnexa normal and left adnexa normal.  Musculoskeletal:        General: Normal range of motion.     Cervical back: Normal range of motion and neck supple.     Right lower leg: No edema.     Left lower leg: No edema.  Lymphadenopathy:     Cervical: No cervical adenopathy.     Upper Body:     Right upper body: No supraclavicular, axillary or pectoral adenopathy.     Left upper body: No supraclavicular, axillary or pectoral adenopathy.  Skin:    General: Skin is warm and dry.     Findings: No lesion.  Neurological:     General: No focal deficit present.     Mental Status: [name] is alert and oriented to person, place, and time.  Psychiatric:        Mood and Affect: Mood normal.        Behavior: Behavior normal.        Thought Content: Thought content normal.        Judgment: Judgment normal.       Angel Weedon M Seylah Wernert, PA-C

## 2023-08-14 ENCOUNTER — Other Ambulatory Visit (HOSPITAL_COMMUNITY)
Admission: RE | Admit: 2023-08-14 | Discharge: 2023-08-14 | Disposition: A | Discharge status: Home / Self Care | Payer: MEDICAID | Source: Ambulatory Visit | Attending: Oncology | Admitting: Oncology

## 2023-08-14 DIAGNOSIS — Z006 Encounter for examination for normal comparison and control in clinical research program: Secondary | ICD-10-CM | POA: Insufficient documentation

## 2023-08-15 NOTE — Progress Notes (Signed)
Subjective:    Patient ID: Adriana Jackson, adult    DOB: 01-Jan-2001, 22 y.o.   MRN: [id number]  HPI An audio/video tele-health visit is felt to be the most appropriate encounter for this patient at this time. This is a follow up tele-visit via phone. The patient is at home. MD is at office. Prior to scheduling this appointment, our staff discussed the limitations of evaluation and management by telemedicine and the availability of in-person appointments. The patient expressed understanding and agreed to proceed.   Adriana Jackson is a 22 year old woman who presents for follow-up of diffuse joint pain.  1) Diffuse joint pain: -knees, ankles, and wrists are affected -she is interested in medications for her pain -she did exercises and these helped with her knees -has never tried a tens unit -she found benefits from cymbalta but it caused her to have hot flashes -she has been sleeping well at night -PT and pain improved  2) Fatigue:  -fatigue has been present since the onset of her pain.  -continues -discussed suboptimal vitamin D level  3) Cervical spine pain:  -radiates into hand on right side -she would be interested in PT for this   Pain Inventory Average Pain 6 Pain Right Now 7 My pain is intermittent, tingling, and aching  In the last 24 hours, has pain interfered with the following? General activity 4 Relation with others 2 Enjoyment of life 6 What TIME of day is your pain at its worst? evening Sleep (in general) Fair  Pain is worse with: walking, bending, and sitting Pain improves with: rest and therapy/exercise Relief from Meds: 5     Family History  Problem Relation Age of Onset   Asthma Mother    Irritable bowel syndrome Mother    Fibromyalgia Mother    Osteoporosis Mother    Hypothyroidism Mother    Allergic rhinitis Father    Rheum arthritis Father    Hypertension Father    Multiple sclerosis Sister    Colon cancer Neg Hx    Esophageal cancer Neg Hx     Social History   Socioeconomic History   Marital status: Single    Spouse name: Not on file   Number of children: Not on file   Years of education: Not on file   Highest education level: Bachelor's degree (e.g., BA, AB, BS)  Occupational History   Not on file  Tobacco Use   Smoking status: Never    Passive exposure: Past   Smokeless tobacco: Never  Vaping Use   Vaping status: Never Used  Substance and Sexual Activity   Alcohol use: Not Currently    Comment: 4 shots/day   Drug use: Not Currently    Types: Marijuana    Comment: delta 8   Sexual activity: Yes    Birth control/protection: Implant  Other Topics Concern   Not on file  Social History Narrative   Not on file   Social Determinants of Health   Financial Resource Strain: Medium Risk (08/10/2023)   Overall Financial Resource Strain (CARDIA)    Difficulty of Paying Living Expenses: Somewhat hard  Food Insecurity: Food Insecurity Present (08/10/2023)   Hunger Vital Sign    Worried About Running Out of Food in the Last Year: Never true    Ran Out of Food in the Last Year: Sometimes true  Transportation Needs: Unmet Transportation Needs (08/10/2023)   PRAPARE - Transportation    Lack of Transportation (Medical): Yes    Lack of  Transportation (Non-Medical): No  Physical Activity: Insufficiently Active (08/10/2023)   Exercise Vital Sign    Days of Exercise per Week: 2 days    Minutes of Exercise per Session: 50 min  Stress: Stress Concern Present (08/10/2023)   [employer] of Occupational Health - Occupational Stress Questionnaire    Feeling of Stress : Rather much  Social Connections: Socially Isolated (08/10/2023)   Social Connection and Isolation Panel [NHANES]    Frequency of Communication with Friends and Family: More than three times a week    Frequency of Social Gatherings with Friends and Family: Three times a week    Attends Religious Services: Never    Active Member of Clubs or Organizations:  No    Attends Banker Meetings: Not on file    Marital Status: Never married   Past Surgical History:  Procedure Laterality Date   COLONOSCOPY     Past Medical History:  Diagnosis Date   Anal fissure    Anxiety 03/30/2021   Asthma    Bipolar 1 disorder (HCC)    Depression 03/30/2021   There were no vitals taken for this visit.  Opioid Risk Score:   Fall Risk Score:  `1  Depression screen [facility] 2/9     08/13/2023    9:23 AM 05/08/2023   11:23 AM 01/19/2023   10:05 AM 11/03/2022    8:32 AM 07/12/2022    1:30 PM 04/06/2022    8:59 AM 12/07/2021    1:15 PM  Depression screen PHQ 2/9  Decreased Interest 1 0 1 1 1  0 1  Down, Depressed, Hopeless 1 0 0 1 1 0 1  PHQ - 2 Score 2 0 1 2 2  0 2  Altered sleeping 1 2 0 1 1 0 2  Tired, decreased energy 1 3 2 1 1 2 2   Change in appetite 0 1 0 1 1 0 2  Feeling bad or failure about yourself  0 0 0 0 0 0 1  Trouble concentrating 0 1 0 1 1 0 3  Moving slowly or fidgety/restless 0 0 0 0 0 0 0  Suicidal thoughts 0 0 0 0 0 0 0  PHQ-9 Score 4 7 3 6 6 2 12   Difficult doing work/chores Not difficult at all Not difficult at all Not difficult at all Not difficult at all Not difficult at all Not difficult at all      Review of Systems  Constitutional:  Positive for appetite change.  Gastrointestinal:  Positive for nausea.  Musculoskeletal:        Bilateral wrist pain Bilateral knee pain Bilateral ankle pain  Neurological:  Positive for dizziness and weakness.  All other systems reviewed and are negative.      Objective:   Physical Exam PRIOR EXAM: Gen: no distress, normal appearing HEENT: oral mucosa pink and moist, NCAT Cardio: Reg rate Chest: normal effort, normal rate of breathing Abd: soft, non-distended Ext: no edema Psych: pleasant, normal affect Skin: intact Neuro: Alert and oriented x3, strength 4/5 in right hand grip      Assessment & Plan:   1) Chronic joint pain secondary to fibromyalgia: -Discussed  current symptoms of pain and history of pain.   -cymbalta 20mg  daily ordered but this caused hot flashes -lyrica 25mg  HS ordered, refilled  -continue PT  Prescribing Home Zynex NexWave Stimulator Device and supplies as needed. IFC, NMES and TENS medically necessary Treatment Rx: Daily @ 30-40 minutes per treatment PRN. Zynex NexWave only, no  substitutions. Treatment Goals: 1) To reduce and/or eliminate pain 2) To improve functional capacity and Activities of daily living 3) To reduce or prevent the need for oral medications 4) To improve circulation in the injured region 5) To decrease or prevent muscle spasm and muscle atrophy 6) To provide a self-management tool to the patient The patient has not sufficiently improved with conservative care. Numerous studies indexed by Medline and PubMed.gov have shown Neuromuscular, Interferential, and TENS stimulators to reduce pain, improve function, and reduce medication use in injured patients. Continued use of this evidence based, safe, drug free treatment is both reasonable and medically necessary at this time.   -Discussed benefits of exercise in reducing pain. -Discussed following foods that may reduce pain: 1) Ginger (especially studied for arthritis)- reduce leukotriene production to decrease inflammation 2) Blueberries- high in phytonutrients that decrease inflammation 3) Salmon- marine omega-3s reduce joint swelling and pain 4) Pumpkin seeds- reduce inflammation 5) dark chocolate- reduces inflammation 6) turmeric- reduces inflammation 7) tart cherries - reduce pain and stiffness 8) extra virgin olive oil - its compound olecanthal helps to block prostaglandins  9) chili peppers- can be eaten or applied topically via capsaicin 10) mint- helpful for headache, muscle aches, joint pain, and itching 11) garlic- reduces inflammation  Link to further information on diet for chronic pain:  http://www.bray.com/   2) Fatigue: -vitamin D level ordered and is suboptimal in 30s, discussed goal level is 60-80s, prescribed high dose once per week supplement, discussed that primary source of vitamin D is sunlight, discussed food sources are fish, and mushrooms, discussed that mushrooms can be kept outside to increase vitamin D content  -high dose vitamin Dsupplement ordered as well  3) Cervical pain/bilateral wrist pain: -XR cervical spine ordered -bilateral wrist braces ordered for night  5 minutes spent in discussion of suboptimal vitamin D level, that increasing level to optimum levels can help with fatigue, boost mood, and is important for our immune system, discussed that primary source of vitamin D is sunlight, discussed food sources of fish, eggs, and mushrooms

## 2023-08-16 LAB — CYTOLOGY - PAP
Chlamydia: NEGATIVE
Comment: NEGATIVE
Comment: NEGATIVE
Comment: NEGATIVE
Comment: NEGATIVE
Comment: NORMAL
Diagnosis: NEGATIVE
HSV1: NEGATIVE
HSV2: NEGATIVE
High risk HPV: NEGATIVE
Neisseria Gonorrhea: NEGATIVE
Trichomonas: NEGATIVE

## 2023-08-17 ENCOUNTER — Ambulatory Visit: Payer: MEDICAID | Admitting: Physical Therapy

## 2023-08-17 ENCOUNTER — Encounter: Payer: Self-pay | Admitting: Physical Therapy

## 2023-08-17 DIAGNOSIS — G8929 Other chronic pain: Secondary | ICD-10-CM

## 2023-08-17 DIAGNOSIS — M6281 Muscle weakness (generalized): Secondary | ICD-10-CM

## 2023-08-17 DIAGNOSIS — M25562 Pain in left knee: Secondary | ICD-10-CM | POA: Diagnosis not present

## 2023-08-17 NOTE — Therapy (Signed)
PHYSICAL THERAPY DISCHARGE SUMMARY  Visits from Start of Care: 7  Current functional level related to goals / functional outcomes: See assessment/goals   Remaining deficits: See assessment/goals   Education / Equipment: HEP and D/C plans  Patient agrees to discharge. Patient goals were  MET or partially met . Patient is being discharged due to being pleased with the current functional level.  Patient Name: Adriana Jackson MRN: [id number] DOB:Mar 16, 2001, 22 y.o., adult Today's Date: 08/17/2023   PT End of Session - 08/17/23 0912     Visit Number 7    Date for PT Re-Evaluation 08/21/23    Authorization Type [employer] - FOTO    PT Start Time 0915    PT Stop Time 0957    PT Time Calculation (min) 42 min    Activity Tolerance Patient tolerated treatment well    Behavior During Therapy [facility] for tasks assessed/performed               Past Medical History:  Diagnosis Date   Anal fissure    Anxiety 03/30/2021   Asthma    Bipolar 1 disorder (HCC)    Depression 03/30/2021   Past Surgical History:  Procedure Laterality Date   COLONOSCOPY     Patient Active Problem List   Diagnosis Date Noted   Severe episode of recurrent major depressive disorder, with psychotic features (HCC)    Vitamin D deficiency 09/15/2020    PCP: Allwardt, Crist Infante, PA-C  REFERRING PROVIDER: Allwardt, Crist Infante, PA-C  THERAPY DIAG:  Chronic pain of left knee  Chronic pain of right knee  Muscle weakness  REFERRING DIAG: Arthralgia of both lower legs [M25.561, M25.562]   Rationale for Evaluation and Treatment:  Rehabilitation  SUBJECTIVE:  PERTINENT PAST HISTORY:  Depression, bipolar, fibromyalgia?, asthma      PRECAUTIONS: None  WEIGHT BEARING RESTRICTIONS No  FALLS:  Has patient fallen in last 6 months? No, Number of falls: 0  MOI/History of condition:  Onset date: 3-4 years  SUBJECTIVE STATEMENT Pt reports that she is doing well overall.  She still has days of  significant pain but feels overall stronger.  She feels she is ready for D/C.   Red flags:  denies   Pain:  Are you having pain? Yes Pain location: diffuse knee and peripatellar pain NPRS scale:  5/10 Aggravating factors: walking, steps, sitting for long periods, standing for long periods Relieving factors: lying down Pain description: constant and aching Stage: Chronic 24 hour pattern: worse first thing in the morning   Occupation: NA  Assistive Device: NA  Hand Dominance: NA  Patient Goals/Specific Activities:   Patient Specific Functional Scale:  Activity Eval         walking 8         Sitting for extended periods 5         Going down stairs 3                   Average 5.33          (Activities rated 0-10/10.  "10" represents "able to perform at prior level" while "0" represents "unable to perform." )   OBJECTIVE:   DIAGNOSTIC FINDINGS:  NA   GENERAL OBSERVATION/GAIT:   WNL  SENSATION:  Light touch: Appears intact  PALPATION: Pain with patellar movement with excessive motion R>L   LE MMT:  MMT Right (Eval) Left (Eval)  Hip flexion (L2, L3) 4+ 4  Knee extension (L3) 4+ 4  Knee flexion 4+  4  Hip abduction 4 4  Hip extension 4 4  Hip external rotation    Hip internal rotation    Hip adduction    Ankle dorsiflexion (L4)    Ankle plantarflexion (S1)    Ankle inversion    Ankle eversion    Great Toe ext (L5)    Grossly     (Blank rows = not tested, score listed is out of 5 possible points.  N = WNL, D = diminished, C = clear for gross weakness with myotome testing, * = concordant pain with testing)  LE ROM:  ROM Right (Eval) Left (Eval)  Hip flexion    Hip extension    Hip abduction    Hip adduction    Hip internal rotation    Hip external rotation    Knee extension n n  Knee flexion n n  Ankle dorsiflexion    Ankle plantarflexion    Ankle inversion    Ankle eversion     (Blank rows = not tested, N = WNL, * = concordant pain with  testing)  Functional Tests  Eval     30'' STS: 8x  UE used? N     30'' step up 8'': L 9x, R 8x                                                      SPECIAL TESTS:   Patellar grind (+)   PATIENT SURVEYS:  FOTO 47 - > 65   TODAY'S TREATMENT:  [facility] Adult PT Treatment:  08/17/23 Therapeutic Exercise: Bike 5 min TKE - Blue band 2x15 LAQ with GTB Retro TM walking  SLS on foam - 45'' bouts Heel raises at wall Toe raises at wall Lateral and fwd walking on foam  Therapeutic Activity - collecting information for goals, checking progress, and reviewing with patient  [facility] Adult PT Treatment:                                                DATE: 08/10/23 Aquatic therapy at [facility]- [address] - therapeutic pool temp approximately 92 degrees. Pt enters building ambulating independently. Treatment took place in water 3.8 to  4 ft 8 in. deep depending upon activity.  Pt entered and exited the pool via stair and handrails independently.  Aquatic Exercise: Walking forward/side stepping Sidestepping yellow DB shoulder abd/add x2 laps Step ups on bottom step fwd/lat x10 ea BIL (lat on L too painful) Runners stretch on bottom step x30" BIL Hamstring stretch on bottom step x30" BIL Standing with UE support edge of pool: Hip abd/add x20 BIL Hamstring curl x20 BIL Heel/toe raise 2x15 Squats 2x20 Hip ext/flex with knee straight x 20 BIL Hip Circles CC/CCW x10 each BIL Cat/cow with pool noodle x10 Warrior I pool noodle lift x10 BIL   Pt requires the buoyancy of water for active assisted exercises with buoyancy supported for strengthening and AROM exercises. Hydrostatic pressure also supports joints by unweighting joint load by at least 50 % in 3-4 feet depth water. 80% in chest to neck deep water. Water will provide assistance with movement using the current and laminar flow while the buoyancy reduces weight bearing. Pt requires  the viscosity of the water for  resistance with strengthening exercises.  [facility] Adult PT Treatment:  08/03/23 Therapeutic Exercise: Elliptical - 5'  TKE - Blue band 2x15 LAQ with YTB Retro TM walking  SLS on foam - 45'' bouts   [facility] Adult PT Treatment:                                                DATE: 07/27/23 Aquatic therapy at [facility]- [address] - therapeutic pool temp approximately 92 degrees. Pt enters building ambulating independently. Treatment took place in water 3.8 to  4 ft 8 in. deep depending upon activity.  Pt entered and exited the pool via stair and handrails independently.  Aquatic Exercise: Walking forward/backwards/side stepping Sidestepping rainbow DB shoulder abd/add x2 laps Step ups on bottom step fwd/lat x10 ea BIL (lat on L too painful) Runners stretch on bottom step x30" BIL Hamstring stretch on bottom step x30" BIL Standing with UE support edge of pool: Hip abd/add x15 BIL Hamstring curl x20 BIL Heel/toe raise 2x15 Squats 2x15 Hip ext/flex with knee straight x 15 BIL Hip Circles CC/CCW x10 each BIL Marching hip flexion to knee extension x10 BIL (mild pain in Lt knee) Sitting on submerged bench: Alternating LAQ x1' Bicycle kicks x1' Scissor kicks x1'  Pt requires the buoyancy of water for active assisted exercises with buoyancy supported for strengthening and AROM exercises. Hydrostatic pressure also supports joints by unweighting joint load by at least 50 % in 3-4 feet depth water. 80% in chest to neck deep water. Water will provide assistance with movement using the current and laminar flow while the buoyancy reduces weight bearing. Pt requires the viscosity of the water for resistance with strengthening exercises.   HOME EXERCISE PROGRAM: Access Code: [email or web address] URL: [email or web address] Date: 08/03/2023 Prepared by: Alphonzo Severance  Exercises - Supine Active Straight Leg Raise  - 1 x daily - 7 x weekly - 3 sets - 10 reps - Clam with Resistance  - 1 x  daily - 7 x weekly - 3 sets - 10 reps - Prone Hip Extension  - 1 x daily - 7 x weekly - 3 sets - 10 reps - Seated Knee Extension with Resistance  - 1 x daily - 7 x weekly - 2-3 sets - 10 reps - Standing Terminal Knee Extension with Resistance  - 1 x daily - 7 x weekly - 3 sets - 10 reps  Treatment priorities   Eval        General knee and hip strengthening        Look at balance        Aquatic therapy for general activity and fibromyalgia pain management                           ASSESSMENT:  CLINICAL IMPRESSION: Adriana Jackson has progressed well with therapy.  Improved impairments include: pain, LE strength, gait.  Functional improvements include: bending, walking, squatting, bending.  Progressions needed include: continued work at home with HEP.  Barriers to progress include: chronic pain.  Please see GOALS section for progress on short term and long term goals established at evaluation.  I recommend D/C home with HEP; pt agrees with plan.   OBJECTIVE IMPAIRMENTS: Pain, knee and hip strength  ACTIVITY LIMITATIONS: steps, sitting, standing, walking  PERSONAL FACTORS: See medical history and pertinent history   REHAB POTENTIAL: Good  CLINICAL DECISION MAKING: Stable/uncomplicated  EVALUATION COMPLEXITY: Low   GOALS:   SHORT TERM GOALS: Target date: 07/24/2023   [name] will be >75% HEP compliant to improve carryover between sessions and facilitate independent management of condition  Evaluation: ongoing Goal status: MET Pt reports adherence 07/13/23   LONG TERM GOALS: Target date: 08/21/2023   [name] will improve FOTO score to 65 as a proxy for functional improvement  Evaluation/Baseline: 47 11/22: 56 Goal status: partially met    2.  [name] will show a >/= 2 pt improvement in their average PSFS score as a proxy for functional improvement   Evaluation/Baseline:  Patient Specific Functional Scale:  Activity Eval 11/22        walking 8 8        Sitting for  extended periods 5 8.5        Going down stairs 3 7                  Average 5.33 7.8         (Activities rated 0-10/10.  "10" represents "able to perform at prior level" while "0" represents "unable to perform." )  Minimum detectable change (90%CI) for average score = 2 points Minimum detectable change (90%CI) for single activity score = 3 points   Goal status: MET    3.  [name] will improve reps of 30'' step-up to 8'' box to >/= 11x (bilateral LE(s)) to show improved LE strength and ability to navigate steps   Evaluation/Baseline: L 9x, R 8x 11/22: R 12x, L 11x Goal status: MET    4.  [name] will improve 30'' STS (MCID 2) to >/= 10x (w/ UE?: n) to show improved LE strength and improved transfers   Evaluation/Baseline: 8x  w/ UE? N 11/22: 12x Goal status: MET   5.  [name] will report confidence in self management of condition at time of discharge with advanced HEP  Evaluation/Baseline: unable to self manage 11/22: MET Goal status: MET   6.  [name] will self report >/= 50% decrease in pain from evaluation   Evaluation/Baseline: 9/10 max pain 11/22: 20% improvement  Goal status: Partially MET   PLAN: PT FREQUENCY: 1-2x/week  PT DURATION: 8 weeks  PLANNED INTERVENTIONS: Therapeutic exercises, Aquatic therapy, Therapeutic activity, Neuro Muscular re-education, Gait training, Patient/Family education, Joint mobilization, Dry Needling, Electrical stimulation, Spinal mobilization and/or manipulation, Moist heat, Taping, Vasopneumatic device, Ionotophoresis 4mg /ml Dexamethasone, and Manual therapy  Kimberlee Nearing Kenna Kirn PT 08/17/2023, 9:59 AM

## 2023-08-24 LAB — GENECONNECT MOLECULAR SCREEN: Genetic Analysis Overall Interpretation: NEGATIVE

## 2023-08-31 ENCOUNTER — Encounter: Payer: Self-pay | Admitting: Physician Assistant

## 2023-09-03 NOTE — Telephone Encounter (Signed)
Form printed and placed on provider's desk/box

## 2023-09-30 ENCOUNTER — Other Ambulatory Visit: Payer: Self-pay | Admitting: Physical Medicine and Rehabilitation

## 2023-11-09 ENCOUNTER — Encounter: Payer: MEDICAID | Attending: Physical Medicine and Rehabilitation | Admitting: Physical Medicine and Rehabilitation

## 2023-11-09 DIAGNOSIS — M797 Fibromyalgia: Secondary | ICD-10-CM | POA: Insufficient documentation

## 2023-11-09 DIAGNOSIS — G4701 Insomnia due to medical condition: Secondary | ICD-10-CM | POA: Insufficient documentation

## 2023-11-09 MED ORDER — PREGABALIN 50 MG PO CAPS: 50.0000 mg | ORAL_CAPSULE | Freq: Every evening | ORAL | 3 refills | Status: AC

## 2023-11-09 NOTE — Patient Instructions (Addendum)
Figure 8 brace  Soft cervical collar   Rowe casa magnesium   -discussed mechanism of action of low dose naltrexone as an opioid receptor antagonist which stimulates your body's production of its own natural endogenous opioids, helping to decrease pain. Discussed that it can also decrease T cell response and thus be helpful in decreasing inflammation, and symptoms of brain fog, fatigue, anxiety, depression, and allergies. Discussed that this medication needs to be compounded at a compounding pharmacy and can more expensive. Discussed that I usually start at 1mg  and if this is not providing enough relief then I titrate upward on a monthly basis.

## 2023-11-09 NOTE — Progress Notes (Signed)
Subjective:    Patient ID: Adriana Jackson, adult    DOB: 16-Jun-2001, 23 y.o.   MRN: [id number]  HPI An audio/video tele-health visit is felt to be the most appropriate encounter for this patient at this time. This is a follow up tele-visit via phone. The patient is at home. MD is at office. Prior to scheduling this appointment, our staff discussed the limitations of evaluation and management by telemedicine and the availability of in-person appointments. The patient expressed understanding and agreed to proceed.   Adriana Jackson is a 23 year old woman who presents for follow-up of diffuse joint pain.  1) Diffuse joint pain: -knees, ankles, and wrists are affected -she is interested in medications for her pain -she did exercises and these helped with her knees -has never tried a tens unit -she found benefits from cymbalta but it caused her to have hot flashes -she has been sleeping well at night -PT and pain improved  2) Fatigue:  -fatigue has been present since the onset of her pain.  -continues -discussed suboptimal vitamin D level  3) Cervical spine pain:  -radiates into hand on right side -she would be interested in PT for this   Pain Inventory Average Pain 6 Pain Right Now 7 My pain is intermittent, tingling, and aching  In the last 24 hours, has pain interfered with the following? General activity 4 Relation with others 2 Enjoyment of life 6 What TIME of day is your pain at its worst? evening Sleep (in general) Fair  Pain is worse with: walking, bending, and sitting Pain improves with: rest and therapy/exercise Relief from Meds: 5     Family History  Problem Relation Age of Onset   Asthma Mother    Irritable bowel syndrome Mother    Fibromyalgia Mother    Osteoporosis Mother    Hypothyroidism Mother    Allergic rhinitis Father    Rheum arthritis Father    Hypertension Father    Multiple sclerosis Sister    Colon cancer Neg Hx    Esophageal cancer Neg Hx     Social History   Socioeconomic History   Marital status: Single    Spouse name: Not on file   Number of children: Not on file   Years of education: Not on file   Highest education level: Bachelor's degree (e.g., BA, AB, BS)  Occupational History   Not on file  Tobacco Use   Smoking status: Never    Passive exposure: Past   Smokeless tobacco: Never  Vaping Use   Vaping status: Never Used  Substance and Sexual Activity   Alcohol use: Not Currently    Comment: 4 shots/day   Drug use: Not Currently    Types: Marijuana    Comment: delta 8   Sexual activity: Yes    Birth control/protection: Implant  Other Topics Concern   Not on file  Social History Narrative   Not on file   Social Drivers of Health   Financial Resource Strain: Medium Risk (08/10/2023)   Overall Financial Resource Strain (CARDIA)    Difficulty of Paying Living Expenses: Somewhat hard  Food Insecurity: Food Insecurity Present (08/10/2023)   Hunger Vital Sign    Worried About Running Out of Food in the Last Year: Never true    Ran Out of Food in the Last Year: Sometimes true  Transportation Needs: Unmet Transportation Needs (08/10/2023)   PRAPARE - Transportation    Lack of Transportation (Medical): Yes    Lack of  Transportation (Non-Medical): No  Physical Activity: Insufficiently Active (08/10/2023)   Exercise Vital Sign    Days of Exercise per Week: 2 days    Minutes of Exercise per Session: 50 min  Stress: Stress Concern Present (08/10/2023)   [employer] of Occupational Health - Occupational Stress Questionnaire    Feeling of Stress : Rather much  Social Connections: Socially Isolated (08/10/2023)   Social Connection and Isolation Panel [NHANES]    Frequency of Communication with Friends and Family: More than three times a week    Frequency of Social Gatherings with Friends and Family: Three times a week    Attends Religious Services: Never    Active Member of Clubs or Organizations: No     Attends Banker Meetings: Not on file    Marital Status: Never married   Past Surgical History:  Procedure Laterality Date   COLONOSCOPY     Past Medical History:  Diagnosis Date   Anal fissure    Anxiety 03/30/2021   Asthma    Bipolar 1 disorder (HCC)    Depression 03/30/2021   There were no vitals taken for this visit.  Opioid Risk Score:   Fall Risk Score:  `1  Depression screen [facility] 2/9     08/13/2023    9:23 AM 05/08/2023   11:23 AM 01/19/2023   10:05 AM 11/03/2022    8:32 AM 07/12/2022    1:30 PM 04/06/2022    8:59 AM 12/07/2021    1:15 PM  Depression screen PHQ 2/9  Decreased Interest 1 0 1 1 1  0 1  Down, Depressed, Hopeless 1 0 0 1 1 0 1  PHQ - 2 Score 2 0 1 2 2  0 2  Altered sleeping 1 2 0 1 1 0 2  Tired, decreased energy 1 3 2 1 1 2 2   Change in appetite 0 1 0 1 1 0 2  Feeling bad or failure about yourself  0 0 0 0 0 0 1  Trouble concentrating 0 1 0 1 1 0 3  Moving slowly or fidgety/restless 0 0 0 0 0 0 0  Suicidal thoughts 0 0 0 0 0 0 0  PHQ-9 Score 4 7 3 6 6 2 12   Difficult doing work/chores Not difficult at all Not difficult at all Not difficult at all Not difficult at all Not difficult at all Not difficult at all      Review of Systems  Constitutional:  Positive for appetite change.  Gastrointestinal:  Positive for nausea.  Musculoskeletal:        Bilateral wrist pain Bilateral knee pain Bilateral ankle pain  Neurological:  Positive for dizziness and weakness.  All other systems reviewed and are negative.      Objective:   Physical Exam PRIOR EXAM: Gen: no distress, normal appearing HEENT: oral mucosa pink and moist, NCAT Cardio: Reg rate Chest: normal effort, normal rate of breathing Abd: soft, non-distended Ext: no edema Psych: pleasant, normal affect Skin: intact Neuro: Alert and oriented x3, strength 4/5 in right hand grip      Assessment & Plan:   1) Chronic joint pain secondary to fibromyalgia: -Discussed current  symptoms of pain and history of pain.   -cymbalta 20mg  daily ordered but this caused hot flashes -lyrica 25mg  HS ordered, refilled  -continue PT  Prescribing Home Zynex NexWave Stimulator Device and supplies as needed. IFC, NMES and TENS medically necessary Treatment Rx: Daily @ 30-40 minutes per treatment PRN. Zynex NexWave only, no  substitutions. Treatment Goals: 1) To reduce and/or eliminate pain 2) To improve functional capacity and Activities of daily living 3) To reduce or prevent the need for oral medications 4) To improve circulation in the injured region 5) To decrease or prevent muscle spasm and muscle atrophy 6) To provide a self-management tool to the patient The patient has not sufficiently improved with conservative care. Numerous studies indexed by Medline and PubMed.gov have shown Neuromuscular, Interferential, and TENS stimulators to reduce pain, improve function, and reduce medication use in injured patients. Continued use of this evidence based, safe, drug free treatment is both reasonable and medically necessary at this time.   -Discussed benefits of exercise in reducing pain. -Discussed following foods that may reduce pain: 1) Ginger (especially studied for arthritis)- reduce leukotriene production to decrease inflammation 2) Blueberries- high in phytonutrients that decrease inflammation 3) Salmon- marine omega-3s reduce joint swelling and pain 4) Pumpkin seeds- reduce inflammation 5) dark chocolate- reduces inflammation 6) turmeric- reduces inflammation 7) tart cherries - reduce pain and stiffness 8) extra virgin olive oil - its compound olecanthal helps to block prostaglandins  9) chili peppers- can be eaten or applied topically via capsaicin 10) mint- helpful for headache, muscle aches, joint pain, and itching 11) garlic- reduces inflammation  Link to further information on diet for chronic pain:  http://www.bray.com/   2) Fatigue: -vitamin D level ordered and is suboptimal in 30s, discussed goal level is 60-80s, prescribed high dose once per week supplement, discussed that primary source of vitamin D is sunlight, discussed food sources are fish, and mushrooms, discussed that mushrooms can be kept outside to increase vitamin D content  -high dose vitamin Dsupplement ordered as well  3) Cervical pain/bilateral wrist pain: -XR cervical spine ordered -bilateral wrist braces ordered for night  5 minutes spent in discussion of suboptimal vitamin D level, that increasing level to optimum levels can help with fatigue, boost mood, and is important for our immune system, discussed that primary source of vitamin D is sunlight, discussed food sources of fish, eggs, and mushrooms

## 2023-11-29 ENCOUNTER — Telehealth: Payer: MEDICAID | Admitting: Family Medicine

## 2023-11-29 DIAGNOSIS — J069 Acute upper respiratory infection, unspecified: Secondary | ICD-10-CM

## 2023-11-29 MED ORDER — FLUTICASONE PROPIONATE 50 MCG/ACT NA SUSP: 2.0000 | Freq: Every day | NASAL | 1 refills | Status: DC

## 2023-11-29 MED ORDER — BENZONATATE 200 MG PO CAPS
200.0000 mg | ORAL_CAPSULE | Freq: Two times a day (BID) | ORAL | 0 refills | Status: DC | PRN
Start: 2023-11-29 — End: 2024-04-17

## 2023-11-29 NOTE — Progress Notes (Signed)

## 2023-11-30 ENCOUNTER — Encounter: Payer: Self-pay | Admitting: Physician Assistant

## 2023-12-03 NOTE — Telephone Encounter (Signed)
 Please see pt msg and advise if you are able to take over pt medication

## 2023-12-04 NOTE — Telephone Encounter (Signed)
 Please see pt msg and advise

## 2024-01-12 ENCOUNTER — Other Ambulatory Visit: Payer: Self-pay | Admitting: Physical Medicine and Rehabilitation

## 2024-01-14 ENCOUNTER — Other Ambulatory Visit: Payer: Self-pay

## 2024-01-14 MED ORDER — VITAMIN D (ERGOCALCIFEROL) 1.25 MG (50000 UNIT) PO CAPS: 50000.0000 [IU] | ORAL_CAPSULE | ORAL | 0 refills | Status: AC

## 2024-01-18 ENCOUNTER — Encounter: Payer: Self-pay | Admitting: Physician Assistant

## 2024-01-21 NOTE — Telephone Encounter (Signed)
 Please see Alyssa's message and see if either provider would be willing to take patient on as TOC

## 2024-01-21 NOTE — Telephone Encounter (Signed)
Please see msg from patient and advise

## 2024-02-02 ENCOUNTER — Other Ambulatory Visit: Payer: Self-pay | Admitting: Physician Assistant

## 2024-03-06 ENCOUNTER — Other Ambulatory Visit: Payer: Self-pay | Admitting: Physical Medicine and Rehabilitation

## 2024-03-13 ENCOUNTER — Ambulatory Visit (INDEPENDENT_AMBULATORY_CARE_PROVIDER_SITE_OTHER): Payer: MEDICAID

## 2024-03-13 VITALS — BP 106/72 | HR 100 | Ht 67.0 in | Wt 140.0 lb

## 2024-03-13 DIAGNOSIS — F649 Gender identity disorder, unspecified: Secondary | ICD-10-CM | POA: Insufficient documentation

## 2024-03-13 DIAGNOSIS — Z789 Other specified health status: Secondary | ICD-10-CM | POA: Insufficient documentation

## 2024-03-13 MED ORDER — INSULIN SYRINGES (DISPOSABLE) U-100 1 ML MISC: 0 refills | Status: AC

## 2024-03-13 MED ORDER — SURE COMFORT INSULIN SYRINGE 31G X 5/16" 1 ML MISC: 0 refills | Status: AC

## 2024-03-13 MED ORDER — TESTOSTERONE CYPIONATE 200 MG/ML IM SOLN: INTRAMUSCULAR | 0 refills | Status: DC

## 2024-03-13 MED ORDER — BD BLUNT FILL NEEDLE 18G X 1-1/2" MISC: 0 refills | Status: AC

## 2024-03-13 NOTE — Progress Notes (Unsigned)
    CHIEF COMPLAINT / HPI:  ***   PERTINENT  PMH / PSH: I have reviewed the patient's medications, allergies, past medical and surgical history, smoking status and updated in the EMR as appropriate.  Sexual health and transgender history  How do you identify (F, M, FTM, MTF, TRANS, genderqueer, intersex, other)? *** Preferred pronouns and name: ***  Approximate date of onset of general dysphoria if known:  Has Hormonal therapy been started and if so approximate date of initiation: *** Dosing: Side effects or concerns?  Any completed surgical interventions and approximate date if known: ***  Surgical interventions desired or planned? ***  Who is your emotional support system? (Family of origin? Friends? Coworkers?) ***  Which of the following people are knowledgeable and are supportive of your transition/gender expression? (Family of origin, significant other, friends, school, employer) ?  ***   Have you changed your legal name or gender on any of your IDs? ***  Any specific concerns or questions? Signed consent form for hormonal therapy? ***  OBJECTIVE:  BP 106/72   Pulse 100   Ht 5' 7 (1.702 m)   Wt 140 lb (63.5 kg)   LMP 02/24/2024   SpO2 98%   BMI 21.93 kg/m    ASSESSMENT / PLAN:   No problem-specific Assessment & Plan notes found for this encounter.   Violetta Grice MD

## 2024-03-13 NOTE — Progress Notes (Unsigned)
    SUBJECTIVE:   CHIEF COMPLAINT / HPI: establish care for gender-affirming treatment  Sexual health and transgender history:  How do you identify (F, M, FTM, MTF, TRANS, genderqueer, intersex, other)? Genderqueer Preferred pronouns and name: Adriana Jackson, they/them/theirs  Approximate date of onset of general dysphoria if known: 12 Has Hormonal therapy been started and if so approximate date of initiation: yes, 07/2022 Dosing: 0.3 mL weekly SQ Side effects or concerns? None  Any completed surgical interventions and approximate date if known: none  Surgical interventions desired or planned? Chest reconstruction  Who is your emotional support system? (Family of origin? Friends? Coworkers?) SO, family, friends, therapist  Which of the following people are knowledgeable and are supportive of your transition/gender expression? (Family of origin, significant other, friends, school, employer) ?  Some people know in all of the above  Have you changed your legal name or gender on any of your IDs? No, but they want to  Any specific concerns or questions? None Signed consent form for hormonal therapy? Yes   Off testosterone weekly since February 2025 (had been on since end of 2023) due to their insurance issues with [facility] in [place]. Donnalee Gab at [facility] was provider there. No abnormal labs in the past.  No issues with testosterone. Happy with results so far. No other specific ways to help with the transition now. Has not been in contact with chest reconstructive surgeons but would be interested in this in the future.  PERTINENT  PMH / PSH: MDD, fibromyalgia, chronic fatigue, vitamin D  deficiency,   OBJECTIVE:   BP 106/72   Pulse 100   Ht 5' 7 (1.702 m)   Wt 140 lb (63.5 kg)   LMP 02/24/2024   SpO2 98%   BMI 21.93 kg/m   General: Alert and oriented, in NAD Skin: Warm, dry, and intact without lesions HEENT: NCAT, EOM grossly normal, midline nasal  septum Cardiac: RRR, no m/r/g appreciated Respiratory: CTAB, breathing and speaking comfortably on RA Abdominal: Soft, nontender, nondistended, normoactive bowel sounds Extremities: Moves all extremities grossly equally Neurological: No gross focal deficit Psychiatric: Appropriate mood and affect  ASSESSMENT/PLAN:   Assessment & Plan Gender dysphoria Restarted testosterone 0.3 mL weekly SQ. Refilled injection supplies. Patient to follow up in 4-6 weeks for follow up and lab testing.   Genetta Kenning, MD [facility]

## 2024-03-13 NOTE — Assessment & Plan Note (Signed)
 Restarted testosterone 0.3 mL weekly SQ. Refilled injection supplies. Patient to follow up in 4-6 weeks for follow up and lab testing.

## 2024-03-13 NOTE — Patient Instructions (Addendum)
 It was great to me you today, Adriana Jackson!  I have refilled your testosterone, needles, and syringes. Place 0.3 mL of testosterone under the skin weekly.   Come back in 4-6 weeks to follow up and see how you are doing or sooner if needed! You can always MyChart me as needed.  Dr. Fredrik Jensen

## 2024-03-25 ENCOUNTER — Other Ambulatory Visit: Payer: Self-pay | Admitting: Internal Medicine

## 2024-04-17 ENCOUNTER — Ambulatory Visit (INDEPENDENT_AMBULATORY_CARE_PROVIDER_SITE_OTHER): Payer: MEDICAID | Admitting: Family Medicine

## 2024-04-17 VITALS — BP 115/77 | HR 89 | Ht 67.0 in | Wt 143.8 lb

## 2024-04-17 DIAGNOSIS — F649 Gender identity disorder, unspecified: Secondary | ICD-10-CM | POA: Diagnosis not present

## 2024-04-17 DIAGNOSIS — E559 Vitamin D deficiency, unspecified: Secondary | ICD-10-CM | POA: Diagnosis not present

## 2024-04-17 NOTE — Assessment & Plan Note (Addendum)
 Doing well on current testosterone  SQ 0.3 mL dosing. Will collect CBC, CMP, lipid panel, and total testosterone  today to monitor. Adjust dose as needed to target testosterone  levels 400-700 ng/dL. Also provided with resources for surgeons who perform chest reconstruction.

## 2024-04-17 NOTE — Assessment & Plan Note (Signed)
 Has been on high dose for months. Will recheck levels today. If in range, can likely stop supplementation pending discussion with prescribing clinician.

## 2024-04-17 NOTE — Progress Notes (Signed)
    SUBJECTIVE:   CHIEF COMPLAINT / HPI:   Testosterone  follow up [name] was restarted on 0.3 mL SQ testosterone  at last visit 1 month ago. No issues. Would like to get top surgery at some point. They last injected on Monday, 7/21, which is their typical day.  Vitamin D  deficiency Has been taking 50,000 units weekly since end of 2024. Their other clinician who manages their fibromyalgia has been managing this.  PERTINENT  PMH / PSH: transgender female, MDD, vitamin D  deficiency  OBJECTIVE:   BP 115/77   Pulse 89   Ht 5' 7 (1.702 m)   Wt 143 lb 12.8 oz (65.2 kg)   SpO2 99%   BMI 22.52 kg/m   General: Alert and oriented, in NAD Skin: Warm, dry, and intact HEENT: NCAT, EOM grossly normal, midline nasal septum Cardiac: RRR, no m/r/g appreciated Respiratory: CTAB, breathing and speaking comfortably on RA Abdominal: Soft, nontender, nondistended, normoactive bowel sounds Extremities: Moves all extremities grossly equally Neurological: No gross focal deficit Psychiatric: Appropriate mood and affect   ASSESSMENT/PLAN:   Assessment & Plan Gender dysphoria Doing well on current testosterone  SQ 0.3 mL dosing. Will collect CBC, CMP, lipid panel, and total testosterone  today to monitor. Adjust dose as needed to target testosterone  levels 400-700 ng/dL. Also provided with resources for surgeons who perform chest reconstruction. Vitamin D  deficiency Has been on high dose for months. Will recheck levels today. If in range, can likely stop supplementation pending discussion with prescribing clinician.   Stuart Redo, MD [facility]

## 2024-04-17 NOTE — Patient Instructions (Addendum)
 It was wonderful to see you today.  Please bring ALL of your medications with you to every visit.   Today we talked about:  Labs will be collected, and I will follow up results with you  For top surgery:  Dr. Earlis Ranks  Phone: [phone]  Dr. Jodie Veria [facility]  Phone: [phone]  Both do fabulous work    Thank you for choosing [facility].   Please call [phone] with any questions about today's appointment.  Please be sure to schedule follow up at the front  desk before you leave today.   Stuart Redo, MD  Family Medicine

## 2024-04-18 ENCOUNTER — Encounter: Payer: Self-pay | Admitting: Family Medicine

## 2024-04-18 ENCOUNTER — Ambulatory Visit: Payer: Self-pay | Admitting: Family Medicine

## 2024-04-18 LAB — CBC WITH DIFFERENTIAL/PLATELET
Basophils Absolute: 0.1 x10E3/uL (ref 0.0–0.2)
Basos: 1 %
EOS (ABSOLUTE): 0.2 x10E3/uL (ref 0.0–0.4)
Eos: 2 %
Hematocrit: 41.8 % (ref 34.0–46.6)
Hemoglobin: 14 g/dL (ref 11.1–15.9)
Immature Grans (Abs): 0 x10E3/uL (ref 0.0–0.1)
Immature Granulocytes: 0 %
Lymphocytes Absolute: 2.4 x10E3/uL (ref 0.7–3.1)
Lymphs: 30 %
MCH: 31.4 pg (ref 26.6–33.0)
MCHC: 33.5 g/dL (ref 31.5–35.7)
MCV: 94 fL (ref 79–97)
Monocytes Absolute: 0.7 x10E3/uL (ref 0.1–0.9)
Monocytes: 9 %
Neutrophils Absolute: 4.5 x10E3/uL (ref 1.4–7.0)
Neutrophils: 58 %
Platelets: 323 x10E3/uL (ref 150–450)
RBC: 4.46 x10E6/uL (ref 3.77–5.28)
RDW: 11.8 % (ref 11.7–15.4)
WBC: 7.8 x10E3/uL (ref 3.4–10.8)

## 2024-04-18 LAB — LIPID PANEL
Chol/HDL Ratio: 3.9 ratio (ref 0.0–4.4)
Cholesterol, Total: 185 mg/dL (ref 100–199)
HDL: 48 mg/dL
LDL Chol Calc (NIH): 129 mg/dL — ABNORMAL HIGH (ref 0–99)
Triglycerides: 40 mg/dL (ref 0–149)
VLDL Cholesterol Cal: 8 mg/dL (ref 5–40)

## 2024-04-18 LAB — COMPREHENSIVE METABOLIC PANEL WITH GFR
ALT: 12 IU/L (ref 0–32)
AST: 15 IU/L (ref 0–40)
Albumin: 4.9 g/dL (ref 4.0–5.0)
Alkaline Phosphatase: 83 IU/L (ref 44–121)
BUN/Creatinine Ratio: 11 (ref 9–23)
BUN: 8 mg/dL (ref 6–20)
Bilirubin Total: 0.3 mg/dL (ref 0.0–1.2)
CO2: 22 mmol/L (ref 20–29)
Calcium: 9.8 mg/dL (ref 8.7–10.2)
Chloride: 104 mmol/L (ref 96–106)
Creatinine, Ser: 0.74 mg/dL (ref 0.57–1.00)
Globulin, Total: 2.1 g/dL (ref 1.5–4.5)
Glucose: 83 mg/dL (ref 70–99)
Potassium: 4.5 mmol/L (ref 3.5–5.2)
Sodium: 140 mmol/L (ref 134–144)
Total Protein: 7 g/dL (ref 6.0–8.5)
eGFR: 115 mL/min/1.73 (ref >59)

## 2024-04-18 LAB — VITAMIN D 25 HYDROXY (VIT D DEFICIENCY, FRACTURES): Vit D, 25-Hydroxy: 27.1 ng/mL — ABNORMAL LOW (ref 30.0–100.0)

## 2024-04-18 LAB — TESTOSTERONE: Testosterone: 70 ng/dL (ref 13–71)

## 2024-04-18 MED ORDER — TESTOSTERONE CYPIONATE 200 MG/ML IM SOLN: INTRAMUSCULAR | Status: DC

## 2024-04-18 MED ORDER — VITAMIN D3 20 MCG (800 UNIT) PO TABS: 20.0000 ug | ORAL_TABLET | Freq: Every day | ORAL | 0 refills | Status: DC

## 2024-04-18 NOTE — Progress Notes (Signed)
 Testosterone  is low. I recommend going up to 0.4 mL subcutaneously weekly. I also recommend reviewing the following link for how to most effectively inject your hormones:  https://www.[facility].org/uploads/filer_public/fc/b0/fcb07ab8-86f33-4e1b-8b15-3754c34e3aae/pplm_gender_affirming_hormone_therapy_injection_guide_2.pdf  Your LDL (bad cholesterol) is also mildly high. I recommend continuing to work on reducing fatty foods, takeout meals, and sugary food/drinks. Increasing exercise can also help.  Lastly, your vitamin D  remains low. I have sent in vitamin D  800 international units daily. STOP the weekly 50,000 tablets. We can recheck your vitamin D  levels when I see you back in 3 months.

## 2024-04-22 ENCOUNTER — Other Ambulatory Visit: Payer: Self-pay | Admitting: Internal Medicine

## 2024-05-17 ENCOUNTER — Telehealth: Payer: MEDICAID | Admitting: Nurse Practitioner

## 2024-05-17 DIAGNOSIS — K121 Other forms of stomatitis: Secondary | ICD-10-CM | POA: Diagnosis not present

## 2024-05-17 MED ORDER — CHLORHEXIDINE GLUCONATE 0.12 % MT SOLN: 15.0000 mL | Freq: Two times a day (BID) | OROMUCOSAL | 0 refills | Status: DC

## 2024-05-17 NOTE — Progress Notes (Signed)
 E-Visit for Mouth Ulcers  We are sorry that you are not feeling well.  Here is how we plan to help!  Based on what you have shared with me, it appears that you do have mouth ulcer(s).     The following medications should decrease the discomfort and help with healing. Chlorhexidine  mouthwash daily for 7 days   Mouth ulcers are painful areas in the mouth and gums. These are also known as "canker sores".  They can occur anywhere inside the mouth. While mostly harmless, mouth ulcers can be extremely uncomfortable and may make it difficult to eat, drink, and brush your teeth.  You may have more than 1 ulcer and they can vary and change in size. Mouth ulcers are not contagious and should not be confused with cold sores.  Cold sores appear on the lip or around the outside of the mouth and often begin with a tingling, burning or itching sensation.   While the exact causes are unknown, some common causes and factors that may aggravate mouth ulcers include: Genetics - Sometimes mouth ulcers run in families High alcohol intake Acidic foods such as citrus fruits like pineapple, grapefruit, orange fruits/juices, may aggravate mouth ulcers Other foods high in acidity or spice such as coffee, chocolate, chips, pretzels, eggs, nuts, cheese Quitting smoking Injury caused by biting the tongue or inside of the cheek Diet lacking in B-12, zinc, folic acid or iron Female hormone shifts with menstruation Excessive fatigue, emotional stress or anxiety Prevention: Talk to your doctor if you are taking meds that are known to cause mouth ulcers such as:   Anti-inflammatory drugs (for example Ibuprofen , Naproxen  sodium), pain killers, Beta blockers, Oral nicotine replacement drugs, Some street drugs (heroin).   Avoid allowing any tablets to dissolve in your mouth that are meant to swallowed whole Avoid foods/drinks that trigger or worsen symptoms Keep your mouth clean with daily brushing and flossing  Home  Care: The goal with treatment is to ease the pain where ulcers occur and help them heal as quickly as possible.  There is no medical treatment to prevent mouth ulcers from coming back or recurring.  Avoid spicy and acidic foods Eat soft foods and avoid rough, crunchy foods Avoid chewing gum Do not use toothpaste that contains sodium lauryl sulphite Use a straw to drink which helps avoid liquids toughing the ulcers near the front of your mouth Use a very soft toothbrush If you have dentures or dental hardware that you feel is not fitting well or contributing to his, please see your dentist. Use saltwater mouthwash which helps healing. Dissolve a  teaspoon of salt in a glass of warm water. Swish around your mouth and spit it out. This can be used as needed if it is soothing.   GET HELP RIGHT AWAY IF: Persistent ulcers require checking IN PERSON (face to face). Any mouth lesion lasting longer than a month should be seen by your DENTIST as soon as possible for evaluation for possible oral cancer. If you have a non-painful ulcer in 1 or more areas of your mouth Ulcers that are spreading, are very large or particularly painful Ulcers last longer than one week without improving on treatment If you develop a fever, swollen glands and begin to feel unwell Ulcers that developed after starting a new medication MAKE SURE YOU: Understand these instructions. Will watch your condition. Will get help right away if you are not doing well or get worse.  Thank you for choosing an e-visit.  Your e-visit answers were reviewed by a board certified advanced clinical practitioner to complete your personal care plan. Depending upon the condition, your plan could have included both over the counter or prescription medications.  Please review your pharmacy choice. Make sure the pharmacy is open so you can pick up prescription now. If there is a problem, you may contact your provider through [employer] and  have the prescription routed to another pharmacy.  Your safety is important to us . If you have drug allergies check your prescription carefully.   For the next 24 hours you can use MyChart to ask questions about today's visit, request a non-urgent call back, or ask for a work or school excuse. You will get an email in the next two days asking about your experience. I hope that your e-visit has been valuable and will speed your recovery.

## 2024-05-17 NOTE — Progress Notes (Signed)
 I have spent 5 minutes in review of e-visit questionnaire, review and updating patient chart, medical decision making and response to patient.   Claiborne Rigg, NP

## 2024-05-27 ENCOUNTER — Encounter: Payer: Self-pay | Admitting: Physician Assistant

## 2024-05-29 ENCOUNTER — Ambulatory Visit
Admission: EM | Admit: 2024-05-29 | Discharge: 2024-05-29 | Disposition: A | Discharge status: Home / Self Care | Payer: MEDICAID | Attending: Family Medicine | Admitting: Family Medicine

## 2024-05-29 ENCOUNTER — Ambulatory Visit (INDEPENDENT_AMBULATORY_CARE_PROVIDER_SITE_OTHER): Payer: MEDICAID

## 2024-05-29 ENCOUNTER — Ambulatory Visit: Payer: Self-pay | Admitting: Urgent Care

## 2024-05-29 ENCOUNTER — Encounter: Payer: Self-pay | Admitting: Family Medicine

## 2024-05-29 DIAGNOSIS — S0083XA Contusion of other part of head, initial encounter: Secondary | ICD-10-CM

## 2024-05-29 DIAGNOSIS — J3489 Other specified disorders of nose and nasal sinuses: Secondary | ICD-10-CM

## 2024-05-29 MED ORDER — IBUPROFEN 600 MG PO TABS: 600.0000 mg | ORAL_TABLET | Freq: Four times a day (QID) | ORAL | 0 refills | Status: DC | PRN

## 2024-05-29 NOTE — ED Triage Notes (Signed)
 Pt states she she struck her nose on a metal bar at work x 3 over the last month and she dropped her phone on her nose 8 days ago-c/o pain to nose-no break in skin-last dose ibuprofen  last night-NAD-steady gait

## 2024-05-29 NOTE — ED Provider Notes (Signed)
 [facility]RGENT [facility] C[facility]  Note:  This document was prepared using Conservation officer, historic buildings and may include unintentional dictation errors.  MRN: [id number] DOB: 2001-02-09  Subjective:   Adriana Jackson is a 23 y.o. adult presenting for multiple facial injuries to the nose while at work over the past month.  Reports that she performs a lot of fast-paced and strenuous work and has had her nasal bridge against metallic bar, metal frame.  Also dropped her nose.  Has had persistent pain.  Is wondering about fracture or damage to the cartilage.  No current facility-administered medications for this encounter.  Current Outpatient Medications:    albuterol  (VENTOLIN  HFA) 108 (90 Base) MCG/ACT inhaler, Inhale 2 puffs into the lungs every 6 (six) hours as needed for wheezing or shortness of breath., Disp: 8 g, Rfl: 0   atomoxetine (STRATTERA) 80 MG capsule, Take 80 mg by mouth every morning., Disp: , Rfl:    B-D 3CC LUER-LOK SYR 18GX1-1/2 18G X 1-1/2 3 ML MISC, See admin instructions., Disp: , Rfl:    BD SYRINGE SLIP TIP 25G X 5/8 1 ML MISC, See admin instructions., Disp: , Rfl:    cetirizine  (ZYRTEC ) 10 MG tablet, TAKE 1 TABLET BY MOUTH DAILY, Disp: 30 tablet, Rfl: 2   chlorhexidine  (PERIDEX ) 0.12 % solution, Use as directed 15 mLs in the mouth or throat 2 (two) times daily., Disp: 420 mL, Rfl: 0   Cholecalciferol (VITAMIN D3) 20 MCG (800 UNIT) TABS, Take 20 mcg by mouth daily., Disp: 90 tablet, Rfl: 0   dicyclomine  (BENTYL ) 20 MG tablet, Take 1 tablet (20 mg total) by mouth 4 (four) times daily -  before meals and at bedtime., Disp: 120 tablet, Rfl: 0   EPINEPHrine  (EPIPEN  2-PAK) 0.3 mg/0.3 mL IJ SOAJ injection, Inject 0.3 mg into the muscle as needed for anaphylaxis., Disp: 1 each, Rfl: 1   fluticasone  (FLONASE ) 50 MCG/ACT nasal spray, Place 1 spray into both nostrils daily., Disp: 16 g, Rfl: 2   Glucosamine HCl (GLUCOSAMINE PO), Take 1 tablet by mouth daily., Disp: , Rfl:     Insulin  Syringes, Disposable, U-100 1 ML MISC, Use as directed to inject testosterone ., Disp: 60 each, Rfl: 0   lamoTRIgine (LAMICTAL) 150 MG tablet, Take 150 mg by mouth daily., Disp: , Rfl:    NEEDLE, DISP, 18 G (B-D BLUNT FILL NEEDLE) 18G X 1-1/2 MISC, Use as directed to draw up testosterone ., Disp: 50 each, Rfl: 0   pregabalin  (LYRICA ) 50 MG capsule, Take 1 capsule (50 mg total) by mouth at bedtime., Disp: 90 capsule, Rfl: 3   sertraline  (ZOLOFT ) 100 MG tablet, Take 150 mg by mouth every morning., Disp: , Rfl:    SURE COMFORT INSULIN  SYRINGE 31G X 5/16 1 ML MISC, Use as directed to inject testosterone ., Disp: 30 each, Rfl: 0   testosterone  cypionate (DEPOTESTOSTERONE CYPIONATE) 200 MG/ML injection, Inject 0.4 mL into the skin weekly., Disp: , Rfl:    Vitamin D , Ergocalciferol , (DRISDOL ) 1.25 MG (50000 UNIT) CAPS capsule, Take 1 capsule (50,000 Units total) by mouth every 7 (seven) days., Disp: 7 capsule, Rfl: 0   No Known Allergies  Past Medical History:  Diagnosis Date   Anal fissure    Anxiety 03/30/2021   Asthma    Bipolar 1 disorder (HCC)    Depression 03/30/2021     Past Surgical History:  Procedure Laterality Date   COLONOSCOPY      Family History  Problem Relation Age of Onset  Asthma Mother    Irritable bowel syndrome Mother    Fibromyalgia Mother    Osteoporosis Mother    Hypothyroidism Mother    Allergic rhinitis Father    Rheum arthritis Father    Hypertension Father    Multiple sclerosis Sister    Colon cancer Neg Hx    Esophageal cancer Neg Hx     Social History   Tobacco Use   Smoking status: Never    Passive exposure: Past   Smokeless tobacco: Never  Vaping Use   Vaping status: Never Used  Substance Use Topics   Alcohol use: Not Currently   Drug use: Not Currently    Types: Marijuana    ROS   Objective:   Vitals: BP (!) 128/90 (BP Location: Left Arm)   Pulse (!) 103   Temp 99.1 F (37.3 C) (Oral)   Resp 16   SpO2 98%   Physical  Exam Constitutional:      General: [name] is not in acute distress.    Appearance: Normal appearance. [name] is well-developed and normal weight. [name] is not ill-appearing, toxic-appearing or diaphoretic.  HENT:     Head: Normocephalic and atraumatic.      Right Ear: External ear normal.     Left Ear: External ear normal.     Nose: Nose normal.     Mouth/Throat:     Pharynx: Oropharynx is clear.  Eyes:     General: No scleral icterus.       Right eye: No discharge.        Left eye: No discharge.     Extraocular Movements: Extraocular movements intact.  Cardiovascular:     Rate and Rhythm: Normal rate.  Pulmonary:     Effort: Pulmonary effort is normal.  Musculoskeletal:     Cervical back: Normal range of motion.  Neurological:     Mental Status: [name] is alert and oriented to person, place, and time.  Psychiatric:        Mood and Affect: Mood normal.        Behavior: Behavior normal.        Thought Content: Thought content normal.        Judgment: Judgment normal.     DG Nasal Bones Result Date: 05/29/2024 CLINICAL DATA:  Nasal pain. EXAM: NASAL BONES - 3+ VIEW COMPARISON:  None Available. FINDINGS: There is no evidence of fracture or other bone abnormality. IMPRESSION: Negative. Electronically Signed   By: Lynwood Landy Raddle M.D.   On: 05/29/2024 16:07   Assessment and Plan :   PDMP not reviewed this encounter.  1. Nasal pain   2. Facial contusion, initial encounter    Will manage for facial contusion conservatively.  Ibuprofen  for pain and inflammation.  Counseled patient on potential for adverse effects with medications prescribed/recommended today, ER and return-to-clinic precautions discussed, patient verbalized understanding.    Christopher Savannah, [place] 05/29/24 [phone]

## 2024-05-30 ENCOUNTER — Ambulatory Visit: Payer: MEDICAID

## 2024-06-02 ENCOUNTER — Ambulatory Visit (INDEPENDENT_AMBULATORY_CARE_PROVIDER_SITE_OTHER): Payer: MEDICAID | Admitting: Family Medicine

## 2024-06-02 VITALS — BP 113/84 | HR 79 | Ht 67.0 in | Wt 143.8 lb

## 2024-06-02 DIAGNOSIS — Z23 Encounter for immunization: Secondary | ICD-10-CM

## 2024-06-02 DIAGNOSIS — J3489 Other specified disorders of nose and nasal sinuses: Secondary | ICD-10-CM

## 2024-06-02 NOTE — Patient Instructions (Signed)
 Thank you for coming in today! Here is a summary of what we discussed:  -I recommend continuing to treat your nasal discomfort with tylenol  or ibuprofen  every 4-6 hours  -If your symptoms change or get worse, please let us  know and we can see you again or send you to the ENT doctors.  Please call the clinic at [phone] if your symptoms worsen or you have any concerns.  Best, Dr Adele

## 2024-06-02 NOTE — Progress Notes (Signed)
    SUBJECTIVE:   CHIEF COMPLAINT / HPI:   Nasal concern --Hit nose against metal bar at work and dropped on nose last week --still having pain on nasal bridge externally and internally --no bleeding or nasal drainage -- Went to urgent care and got x-rays, negative for fracture -- Patient concerned about cartilage injury  PERTINENT  PMH / PSH: None  OBJECTIVE:   BP 113/84   Pulse 79   Ht 5' 7 (1.702 m)   Wt 143 lb 12.8 oz (65.2 kg)   SpO2 100%   BMI 22.52 kg/m   General: Awake and conversant, no acute distress HEENT: Moist mucous membranes.  No bruising on external nose, no internal hematoma, no nasal bleeding or discharge.  Mild TTP along nasal bridge Respiratory: Normal work of breathing on room air Neuro: No focal deficits Psych: Appropriate mood and affect  ASSESSMENT/PLAN:   Assessment & Plan Nasal pain Following up after injury to nose.  No red flags on exam and patient appears well overall.  Had imaging done on 9/4 which was negative for fracture or bony abnormality.  Advised ongoing treatment with Tylenol  or ibuprofen  as needed and follow-up if symptoms do not improve or worsen.  Can consider referral to ENT at that time     Rea Raring, MD [facility]

## 2024-06-23 ENCOUNTER — Telehealth: Payer: MEDICAID | Admitting: Physician Assistant

## 2024-06-23 DIAGNOSIS — K047 Periapical abscess without sinus: Secondary | ICD-10-CM | POA: Diagnosis not present

## 2024-06-24 ENCOUNTER — Ambulatory Visit (INDEPENDENT_AMBULATORY_CARE_PROVIDER_SITE_OTHER): Payer: MEDICAID | Admitting: Family Medicine

## 2024-06-24 ENCOUNTER — Encounter: Payer: Self-pay | Admitting: Family Medicine

## 2024-06-24 VITALS — BP 129/84 | HR 97 | Ht 67.0 in | Wt 141.8 lb

## 2024-06-24 DIAGNOSIS — Z789 Other specified health status: Secondary | ICD-10-CM

## 2024-06-24 DIAGNOSIS — F649 Gender identity disorder, unspecified: Secondary | ICD-10-CM

## 2024-06-24 DIAGNOSIS — E559 Vitamin D deficiency, unspecified: Secondary | ICD-10-CM | POA: Diagnosis not present

## 2024-06-24 MED ORDER — AMOXICILLIN-POT CLAVULANATE 875-125 MG PO TABS: 1.0000 | ORAL_TABLET | Freq: Two times a day (BID) | ORAL | 0 refills | Status: DC

## 2024-06-24 MED ORDER — NAPROXEN 500 MG PO TABS: 500.0000 mg | ORAL_TABLET | Freq: Two times a day (BID) | ORAL | 0 refills | Status: DC

## 2024-06-24 NOTE — Patient Instructions (Signed)
 We have ordered testosterone  and vitamin D  this visit.  You have gotten your second dose of HPV today.  Come back in 3 months to check in or sooner if needed.

## 2024-06-24 NOTE — Progress Notes (Signed)
 I have spent 5 minutes in review of e-visit questionnaire, review and updating patient chart, medical decision making and response to patient.   Elsie Velma Lunger, PA-C

## 2024-06-24 NOTE — Assessment & Plan Note (Signed)
 Testosterone  levels collected today.  Continue testosterone  0.4 mL subcutaneously weekly for now and make adjustments as needed.

## 2024-06-24 NOTE — Progress Notes (Signed)

## 2024-06-24 NOTE — Assessment & Plan Note (Signed)
 Repeat vitamin D level today.

## 2024-06-24 NOTE — Progress Notes (Signed)
    SUBJECTIVE:   CHIEF COMPLAINT / HPI:   Follow up for testosterone  Has been doing 0.4 mL subcutaneously weekly.  LDL was mildly high last visit, and they have been continuing to work on dietary changes. Had to miss two doses due to pharmacy supply chain issues.  Vitamin D  deficiency They have been taking vitamin D  800 international units daily.  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  Continue testosterone  0.4 mL subcutaneously weekly for now and make adjustments as needed. Vitamin D  deficiency Repeat vitamin D  level today.   Stuart Redo, MD [facility]

## 2024-06-25 LAB — TESTOSTERONE: Testosterone: 201 ng/dL — ABNORMAL HIGH (ref 13–71)

## 2024-06-25 LAB — VITAMIN D 25 HYDROXY (VIT D DEFICIENCY, FRACTURES): Vit D, 25-Hydroxy: 29.9 ng/mL — ABNORMAL LOW (ref 30.0–100.0)

## 2024-06-26 ENCOUNTER — Ambulatory Visit: Payer: Self-pay | Admitting: Family Medicine

## 2024-06-26 MED ORDER — CHOLECALCIFEROL 25 MCG (1000 UT) PO CAPS: 1000.0000 [IU] | ORAL_CAPSULE | Freq: Every day | ORAL | 0 refills | Status: DC

## 2024-08-05 ENCOUNTER — Telehealth: Payer: MEDICAID

## 2024-08-05 DIAGNOSIS — R52 Pain, unspecified: Secondary | ICD-10-CM

## 2024-08-05 DIAGNOSIS — R0602 Shortness of breath: Secondary | ICD-10-CM

## 2024-08-06 ENCOUNTER — Telehealth: Payer: Self-pay

## 2024-08-06 NOTE — Telephone Encounter (Signed)
 Received mychart message from patient requesting to schedule appointment.   Patient has been having symptoms since last Thursday. Patient endorses congestion and chest discomfort. Chest pain is intermittent and is more so with exertion and coughing (this has also been present for the last week). Patient is not currently experiencing shortness of breath, difficulty breathing and is speaking in complete sentences.   Shortness of breath occurs after coughing episodes. Patient has history of asthma. Has inhalers at home. Symptoms improve after administration.   Denies fever. Patient does report hot flashes and then chills.   Scheduled for evaluation tomorrow afternoon. Strict ED precautions discussed.   Chiquita JAYSON English, RN

## 2024-08-06 NOTE — Progress Notes (Signed)
  Because of chest pain and shortness of breath with these prolonged body aches and need for exam and testing, I feel your condition warrants further evaluation and I recommend that you be seen in a face-to-face visit.   NOTE: There will be NO CHARGE for this E-Visit   If you are having a true medical emergency, please call 911.     For an urgent face to face visit, [place] has multiple urgent care centers for your convenience.  Click the link below for the full list of locations and hours, walk-in wait times, appointment scheduling options and driving directions:  Urgent Care - [place], [place], [place], [place], [place], [place]  [place]     Your MyChart E-visit questionnaire answers were reviewed by a board certified advanced clinical practitioner to complete your personal care plan based on your specific symptoms.    Thank you for using e-Visits.

## 2024-08-07 ENCOUNTER — Ambulatory Visit: Payer: MEDICAID | Admitting: Family Medicine

## 2024-08-07 ENCOUNTER — Ambulatory Visit
Admission: RE | Admit: 2024-08-07 | Discharge: 2024-08-07 | Disposition: A | Discharge status: Home / Self Care | Payer: MEDICAID | Source: Ambulatory Visit

## 2024-08-07 ENCOUNTER — Encounter: Payer: Self-pay | Admitting: Family Medicine

## 2024-08-07 VITALS — BP 136/96 | HR 98 | Ht 67.0 in | Wt 137.6 lb

## 2024-08-07 DIAGNOSIS — J988 Other specified respiratory disorders: Secondary | ICD-10-CM | POA: Diagnosis not present

## 2024-08-07 DIAGNOSIS — R0600 Dyspnea, unspecified: Secondary | ICD-10-CM | POA: Diagnosis not present

## 2024-08-07 DIAGNOSIS — R030 Elevated blood-pressure reading, without diagnosis of hypertension: Secondary | ICD-10-CM

## 2024-08-07 DIAGNOSIS — R079 Chest pain, unspecified: Secondary | ICD-10-CM

## 2024-08-07 DIAGNOSIS — B9789 Other viral agents as the cause of diseases classified elsewhere: Secondary | ICD-10-CM

## 2024-08-07 NOTE — Progress Notes (Signed)
   SUBJECTIVE:   CHIEF COMPLAINT / HPI:  Discussed the use of AI scribe software for clinical note transcription with the patient, who gave verbal consent to proceed.  History of Present Illness Adriana Jackson is a 23 year old who presents with persistent chest pain and respiratory symptoms that started right after Halloween.  Chest pain - Persistent, crushing pain located in the mid-chest with a sharp quality - Initially radiated to the arms and neck, but no longer radiates - Consistent over the past week  Respiratory symptoms - Difficulty breathing, especially when lying down - No cough - No ear pain - No measured fever, but chills and fluctuating sensations of being hot and cold - Highest recorded temperature 39F - History of bronchitis in youth  Constitutional symptoms - Unusual soreness and fatigue despite resting - Headaches  Gastrointestinal symptoms - Nausea without vomiting - Constipation  Otolaryngologic symptoms - Sore throat with congestion and post-nasal drip  Self-treatment and medication use - Self-medicated with DayQuil and NyQuil, with some improvement in symptoms  Endocrine therapy - Currently on testosterone  therapy, which has increased baseline body temperature  Social and sexual history relevant to present illness - No cigarette or marijuana use - Currently sexually active though no concern for STI   OBJECTIVE:  BP (!) 136/96   Pulse 98   Ht 5' 7 (1.702 m)   Wt 137 lb 9.6 oz (62.4 kg)   SpO2 100%   BMI 21.55 kg/m   Physical Exam GEN: Alert, oriented, NAD HEENT: Oropharynx erythematous. Nasal mucosa normal. NECK: No cervical lymphadenopathy. CHEST: Lungs clear to auscultation. Breathing and speaking comfortably on RA. CARDIAC: RRR without murmurs ABD: Soft, nontender, nondistended  ASSESSMENT/PLAN:   Assessment & Plan Chest pain, unspecified type Dyspnea, unspecified type Viral respiratory illness Likely viral process  leading to bronchitis and possible overlying bacterial sinusitis. Initial improvement and then worsening of symptoms most concerning. EKG collected in office and was similar to prior, normal. Will obtain CXR given 2 weeks of symptoms to evaluate for PNA though my exam reassuring today. Pending results and clinical improvement with supportive care, consider short course of Augmentin. Elevated BP without diagnosis of hypertension Likely in setting of illness. No other concerning findings on exam. Follow up once improved.  Stuart Redo, MD [facility]

## 2024-08-07 NOTE — Patient Instructions (Addendum)
 I have ordered a chest x-ray. Continue tylenol , ibuprofen , and over the counter medications for now until we get these results back. Depending on the results, we may initiate antibiotics.

## 2024-08-08 ENCOUNTER — Telehealth: Payer: MEDICAID | Admitting: Physician Assistant

## 2024-08-08 DIAGNOSIS — J4531 Mild persistent asthma with (acute) exacerbation: Secondary | ICD-10-CM | POA: Diagnosis not present

## 2024-08-08 MED ORDER — ALBUTEROL SULFATE HFA 108 (90 BASE) MCG/ACT IN AERS: 1.0000 | INHALATION_SPRAY | Freq: Four times a day (QID) | RESPIRATORY_TRACT | 0 refills | Status: AC | PRN

## 2024-08-08 MED ORDER — ALBUTEROL SULFATE (2.5 MG/3ML) 0.083% IN NEBU: 2.5000 mg | INHALATION_SOLUTION | Freq: Four times a day (QID) | RESPIRATORY_TRACT | 1 refills | Status: AC | PRN

## 2024-08-08 NOTE — Progress Notes (Signed)
 E Visit for Asthma  Based on what you have shared with me, it looks like you may have a flare up of your asthma.  Asthma is a chronic (ongoing) lung disease which results in airway obstruction, inflammation and hyper-responsiveness.   Asthma symptoms vary from person to person, with common symptoms including nighttime awakening and decreased ability to participate in normal activities due to shortness of breath. It is often triggered by changes in weather, changes in the season, changes in air temperature, or inside (home, school, daycare or work) allergens such as animal dander, mold, mildew, woodstoves or cockroaches.   It can also be triggered by hormonal changes, extreme emotion, physical exertion or an upper respiratory tract illness.     It is important to identify the trigger and eliminate or avoid the trigger if possible.   If you have been prescribed medications to be taken on a regular basis, it is important to follow the asthma action plan and to follow guidelines to adjust medication in response to increasing symptoms of decreased peak expiratory flow rate.  Treatment: I have prescribed: Albuterol  (Proventil  HFA; Ventolin  HFA) 108 (90 Base) MCG/ACT Inhaler 2 puffs into the lungs every six hours as needed for wheezing or shortness of breath and Albuterol  (Proventil ) (2.5 mg in 3 mL) 0.083 % Take by nebulization solution every six hours as needed for wheezing or shortness of breath  HOME CARE Only take medications as instructed by your medical team. Consider wearing a mask or scarf to improve breathing when there is poor air quality as this has been shown to decrease irritation and decrease exacerbations Get rest. Using a humidifier may help nasal congestion and ease sore throat pain. Using a saline nasal spray works much the same way.  Cough drops, hard candies and sore  throat lozenges may ease your cough.  Avoid close contacts, especially the very you and the elderly. Cover your mouth if you cough or sneeze. Always remember to wash your hands.   GET HELP RIGHT AWAY IF: You develop worsening shortness of breath/difficulty breathing or chest tightness, breathlessness at rest, drowsy, confused or agitated, unable to speak in full sentences, or if you develop chest pain.  You have coughing fits. You develop a severe headache or visual changes. You develop shortness of breath, difficulty breathing or start having chest pain. Your symptoms persist after you have completed your treatment plan. If your symptoms do not improve within 5 days.  MAKE SURE YOU Understand these instructions. Will watch your condition. Will get help right away if you are not doing well or get worse.   Your e-visit answers were reviewed by a board certified advanced clinical practitioner to complete your personal care plan, Depending upon the condition, your plan could have included both over the counter or prescription medications.  Please review your pharmacy choice. Your safety is important to us . If you have drug allergies check your prescription carefully. You can use MyChart to ask questions about today's visit, request a non-urgent call back, or ask for a work or school excuse for 24 hours related to this e-Visit. If it has been greater than 24 hours you will need to follow up with your provider, or enter a new e-Visit to address those concerns.  You will get an e-mail in the next two days asking about your experience. I hope that your e-visit has been valuable and will speed your recovery. Thank you for using e-visits.  I have spent 5 minutes in review  of e-visit questionnaire, review and updating patient chart, medical decision making and response to patient.   Delon CHRISTELLA Dickinson, PA-C

## 2024-08-09 ENCOUNTER — Ambulatory Visit: Payer: Self-pay | Admitting: Family Medicine

## 2024-08-09 ENCOUNTER — Encounter: Payer: Self-pay | Admitting: Family Medicine

## 2024-08-13 ENCOUNTER — Encounter: Payer: MEDICAID | Admitting: Physician Assistant

## 2024-08-31 ENCOUNTER — Other Ambulatory Visit: Payer: Self-pay | Admitting: Internal Medicine

## 2024-09-08 ENCOUNTER — Encounter: Payer: Self-pay | Admitting: Family Medicine

## 2024-09-08 ENCOUNTER — Other Ambulatory Visit: Payer: Self-pay | Admitting: Family Medicine

## 2024-09-09 NOTE — Telephone Encounter (Signed)
 Received call from [facility] pharmacy regarding testosterone  prescription.   The way rx is currently written, only provides patient with 1 week worth of medication. Pharmacy tech is asking if prescriber wanted to increase dispense quantity, given the increase in weekly dosage.   Advised that I would forward message to PCP for further advisement.   If appropriate, please send in new prescription.   Chiquita JAYSON English, RN

## 2024-09-10 MED ORDER — TESTOSTERONE CYPIONATE 200 MG/ML IM SOLN: INTRAMUSCULAR | 0 refills | Status: AC

## 2024-09-10 NOTE — Addendum Note (Signed)
 Addended by: THARON LUNG B on: 09/10/2024 04:53 AM   Modules accepted: Orders

## 2024-09-16 ENCOUNTER — Other Ambulatory Visit: Payer: Self-pay | Admitting: Internal Medicine

## 2024-09-21 ENCOUNTER — Encounter: Payer: Self-pay | Admitting: Family Medicine

## 2024-09-22 ENCOUNTER — Encounter: Payer: Self-pay | Admitting: Family Medicine

## 2024-09-22 ENCOUNTER — Other Ambulatory Visit: Payer: Self-pay | Admitting: *Deleted

## 2024-09-22 ENCOUNTER — Ambulatory Visit: Payer: MEDICAID | Admitting: Family Medicine

## 2024-09-22 VITALS — BP 116/77 | HR 81 | Ht 67.0 in | Wt 137.0 lb

## 2024-09-22 DIAGNOSIS — M25531 Pain in right wrist: Secondary | ICD-10-CM

## 2024-09-22 DIAGNOSIS — E559 Vitamin D deficiency, unspecified: Secondary | ICD-10-CM | POA: Diagnosis not present

## 2024-09-22 DIAGNOSIS — Z23 Encounter for immunization: Secondary | ICD-10-CM

## 2024-09-22 DIAGNOSIS — M25532 Pain in left wrist: Secondary | ICD-10-CM | POA: Diagnosis not present

## 2024-09-22 DIAGNOSIS — Z789 Other specified health status: Secondary | ICD-10-CM

## 2024-09-22 NOTE — Assessment & Plan Note (Signed)
 Recheck levels today after supplementation.

## 2024-09-22 NOTE — Assessment & Plan Note (Addendum)
 Currently on testosterone  therapy with a dose of 0.4 mg. Tolerating therapy well with some increase in facial hair. Plan to assess dosing adequacy. - Checked testosterone  levels today. - Will need CBC checked at next visit. - Will send a MyChart message with results and potential dose adjustment if needed.

## 2024-09-22 NOTE — Progress Notes (Addendum)
" ° °  SUBJECTIVE:   CHIEF COMPLAINT / HPI:  Discussed the use of AI scribe software for clinical note transcription with the patient, who gave verbal consent to proceed.  History of Present Illness Adriana Jackson is a 23 year old with fibromyalgia who presents with wrist pain.  Wrist pain - Intermittent dorsal wrist pain for the past couple of months - Pain sometimes radiates to the palmar wrist and fingers - Symptoms are worse in the dominant hand - Pain fluctuates with partial improvement followed by flares - Associated with wrist swelling - No redness or fever - No discrete injury or heavy lifting trigger - Increased joint popping in the hands - No use of ibuprofen  recently - Tylenol  has not provided good relief - Previously used a wrist brace for joint pain - Works at [employer] with frequent hand use - History of fibromyalgia  Testosterone  therapy - Last dose 0.4 mL last Monday - Notices more facial hair - Happy with results so far   OBJECTIVE:  BP 116/77   Pulse 81   Ht 5' 7 (1.702 m)   Wt 137 lb (62.1 kg)   SpO2 96%   BMI 21.46 kg/m   Physical Exam GENERAL: Alert, cooperative, well developed, no acute distress EXTREMITIES: No cyanosis or edema, no erythema, Radial pulse palpable in wrist, Sensation intact in hand, Grip strength normal, Pain with finger movement throughout the hand, positive Tinel's and Phalen's testing NEUROLOGICAL: Cranial nerves grossly intact, Moves all extremities without gross motor or sensory deficit SKIN: No rashes  ASSESSMENT/PLAN:   Assessment & Plan Pain in both wrists Intermittent wrist pain. Differential includes carpal tunnel syndrome, rheumatoid arthritis, tendonitis, and known fibromyalgia. - Recommended wearing a wrist brace every night. - Advised ibuprofen  every six hours as needed for up to 1 week. - Referred to occupational therapy for wrist exercises. - Instructed to send a MyChart message if symptoms persist  despite treatment for a week. Transgender female Currently on testosterone  therapy with a dose of 0.4 mg. Tolerating therapy well with some increase in facial hair. Plan to assess dosing adequacy. - Checked testosterone  levels today. - Will need CBC checked at next visit. - Will send a MyChart message with results and potential dose adjustment if needed. Vitamin D  deficiency Recheck levels today after supplementation. Encounter for immunization Tdap and HPV given today.  Stuart Redo, MD [facility]  "

## 2024-09-22 NOTE — Patient Instructions (Addendum)
 VISIT SUMMARY: Today, we addressed your wrist pain and discussed your ongoing testosterone  therapy. We also performed routine checks for your testosterone  and cholesterol levels.  YOUR PLAN: WRIST PAIN: You have been experiencing intermittent wrist pain, likely due to carpal tunnel syndrome versus extensor tendonitis. -Wear a wrist brace every night. -Take ibuprofen  every six hours as needed for pain relief. Do not take around the clock for longer than 1 week. -Attend occupational therapy for wrist exercises. -Send a MyChart message if symptoms persist despite treatment for a week.  TESTOSTERONE  THERAPY MANAGEMENT: You are currently on testosterone  therapy and tolerating it well. -We checked your testosterone  levels today. -We will send you a MyChart message with the results and any potential dose adjustments if needed.  Please let me know if you have any other questions.  Dr. Tharon

## 2024-09-23 ENCOUNTER — Encounter: Payer: Self-pay | Admitting: Physical Medicine and Rehabilitation

## 2024-09-23 ENCOUNTER — Ambulatory Visit: Payer: Self-pay | Admitting: Family Medicine

## 2024-09-23 LAB — VITAMIN D 25 HYDROXY (VIT D DEFICIENCY, FRACTURES): Vit D, 25-Hydroxy: 38.9 ng/mL (ref 30.0–100.0)

## 2024-09-23 LAB — TESTOSTERONE: Testosterone: 205 ng/dL — ABNORMAL HIGH (ref 13–71)

## 2024-10-03 ENCOUNTER — Other Ambulatory Visit: Payer: Self-pay

## 2024-10-03 ENCOUNTER — Encounter: Payer: Self-pay | Admitting: Internal Medicine

## 2024-10-03 ENCOUNTER — Ambulatory Visit: Payer: MEDICAID | Admitting: Internal Medicine

## 2024-10-03 VITALS — BP 114/70 | HR 99 | Temp 99.1°F | Resp 16 | Ht 67.0 in | Wt 136.0 lb

## 2024-10-03 DIAGNOSIS — T7819XD Other adverse food reactions, not elsewhere classified, subsequent encounter: Secondary | ICD-10-CM

## 2024-10-03 DIAGNOSIS — J3089 Other allergic rhinitis: Secondary | ICD-10-CM

## 2024-10-03 DIAGNOSIS — J4520 Mild intermittent asthma, uncomplicated: Secondary | ICD-10-CM | POA: Diagnosis not present

## 2024-10-03 DIAGNOSIS — H1045 Other chronic allergic conjunctivitis: Secondary | ICD-10-CM | POA: Diagnosis not present

## 2024-10-03 DIAGNOSIS — J302 Other seasonal allergic rhinitis: Secondary | ICD-10-CM | POA: Diagnosis not present

## 2024-10-03 DIAGNOSIS — T7819XA Other adverse food reactions, not elsewhere classified, initial encounter: Secondary | ICD-10-CM

## 2024-10-03 MED ORDER — FLUTICASONE PROPIONATE HFA 44 MCG/ACT IN AERO: 2.0000 | INHALATION_SPRAY | Freq: Two times a day (BID) | RESPIRATORY_TRACT | 12 refills | Status: AC

## 2024-10-03 NOTE — Progress Notes (Signed)
 "  FOLLOW UP Date of Service/Encounter:  10/03/2024  Subjective:  Adriana Jackson (DOB: 12-Nov-2000) is a 24 y.o. adult who [facility] in re-evaluation of the following: Rhinitis, asthma, oral allergy  syndrome History obtained from: chart review and patient.  For Review, LV was on 06/01/2023 with Dr. Lorin seen for intial visit for allergic rhinitis, oral allergy  syndrome, intermittent asthma. See below for summary of history and diagnostics.   Therapeutic plans/changes recommended: Started allergy  injections, started Flonase  Zyrtec  cromolyn  ----------------------------------------------------- Pertinent History/Diagnostics:  Asthma: Intermittent Dx at age 50, 0 lifetime hospitalizations/intubations, triggered by rhinitis and cold air AC (11/03/2022): 200; chest x-ray (08/14/2021): Normal Rx: Albuterol  as needed -Normal spirometry (06/01/2023): ratio 86%, 3.22 L, 88% FEV1 (pre), Allergic Rhinitis:  Perennial nasal and ocular symptoms with flares in spring, also triggered by dust and cats - SPT environmental panel (06/01/2023): grass, weeds, trees, dust mite, cat, roach Rx: Flonase , Zyrtec ,, cromolyn  AIT; vial 1 (G-W-T), vial 2 (C-D-R); started on 07/06/2023 which is last documented dose of 0.05 mL 1: 100,000 concentration Oral allergy  syndrome:  Raw blueberry, banana, apple, green vegetables because mouth itching runny nose   --------------------------------------------------- Today presents for follow-up. Discussed the use of AI scribe software for clinical note transcription with the patient, who gave verbal consent to proceed.  History of Present Illness Adriana Jackson is a 24 year old with asthma who presents with persistent respiratory symptoms since November.  Respiratory symptoms - Persistent cough and shortness of breath since November after viral infection  - Daily use of albuterol  inhaler required for symptom relief - No use of  antibiotics or systemic steroids during the initial illness - Symptoms managed independently without escalation of care - Not on controller therapy  Allergic rhinitis and conjunctivitis symptoms - Allergy  symptoms well-controlled with daily cetirizine  - Minimal use of nasal sprays and eye drops - Allergy  symptoms last spring were manageable - Allergen immunotherapy previously initiated but discontinued due to logistical issues while in school - No current plans to restart allergy  shots  Immunization status and medication changes - Received influenza vaccination - No new diagnoses or medications since last visit - Continues to take Strattera and Seroquel      All medications reviewed by clinical staff and updated in chart. No new pertinent medical or surgical history except as noted in HPI.  ROS: All others negative except as noted per HPI.   Objective:  BP 114/70 (BP Location: Right Arm, Patient Position: Sitting, Cuff Size: Normal)   Pulse 99   Temp 99.1 F (37.3 C) (Temporal)   Resp 16   Ht 5' 7 (1.702 m)   Wt 136 lb (61.7 kg)   SpO2 99%   BMI 21.30 kg/m  Body mass index is 21.3 kg/m. Physical Exam: General Appearance:  Alert, cooperative, no distress, appears stated age  Head:  Normocephalic, without obvious abnormality, atraumatic  Eyes:  Conjunctiva clear, EOM's intact  Ears EACs normal bilaterally  Nose: Nares normal, normal mucosa, no visible anterior polyps, and septum midline  Throat: Lips, tongue normal; teeth and gums normal, normal posterior oropharynx  Neck: Supple, symmetrical  Lungs:   clear to auscultation bilaterally, Respirations unlabored, no coughing  Heart:  regular rate and rhythm and no murmur, Appears well perfused  Extremities: No edema  Skin: Skin color, texture, turgor normal and no rashes or lesions on visualized portions of skin  Neurologic: No gross deficits   Labs:  Lab Orders  No laboratory test(s) ordered  today    Spirometry:   Tracings reviewed. Adriana Jackson's effort: Good reproducible efforts. FVC: 2.92L FEV1: 2.44L, 67% predicted FEV1/FVC ratio: 84% Interpretation: Spirometry consistent with mixed obstructive and restrictive disease.  After 4 puffs of albuterol  there is a significant reversibility in FEV1 and FVC Please see scanned spirometry results for details.   Assessment/Plan   Patient Instructions  Chronic Rhinitis Seasonal and Perennial Allergic: Well-controlled  Given preference and well-controlled symptoms we will hold off on restarting allergy  immunotherapy at this time  - Prevention:  - allergen avoidance: Grass, weeds, trees, dust mite, cat, roach  - Symptom control: - Continue Flonase  1 spray per nostril twice daily as needed for nasal congestion, sneezing, runny nose - Continue Antihistamine: daily or daily as needed.   -Options include Zyrtec  (Cetirizine ) 10mg , Claritin (Loratadine) 10mg , Allegra (Fexofenadine) 180mg , or Xyzal  (Levocetirinze) 5mg  - Can be purchased over-the-counter if not covered by insurance.  Allergic Conjunctivitis:  - Continue Allergy  Eye drops-Cromolyn -1 drop each eye up to 4 times daily as needed and Rewetting Drops such as Systane,TheraTears, etc  -Avoid eye drops that say red eye relief as they may contain medications that dry out your eyes.    Oral Allergy  Syndrome: Fresh fruits and vegetables  - Continue avoid raw fruits and vegetables as tolerated   Mild Intermittent Asthma: not well controlled likely due to recent viral infection - your lung testing: showed inflammation in your lungs that is new from prior and significantly changed with albuterol   PLAN:  For next 4 weeks start Flovent  44 mcg 2 puffs twice daily   - Rescue Inhaler: Albuterol  (Proair /Ventolin ) 2 puffs . Use  every 4-6 hours as needed for chest tightness, wheezing, or coughing.  Can also use 15 minutes prior to exercise if you have symptoms with activity. - Asthma is not controlled if:  -  Symptoms are occurring >2 times a week OR  - >2 times a month nighttime awakenings  - You are requiring systemic steroids (prednisone /steroid injections) more than once per year  - Your require hospitalization for your asthma.  - Please call the clinic to schedule a follow up if these symptoms arise  Follow up: 6 months   Thank you so much for letting me partake in your care today.  Don't hesitate to reach out if you have any additional concerns!  Hargis Springer, MD  [facility] [place], [place]  Reducing Pollen Exposure  The American Academy of Allergy , Asthma and Immunology suggests the following steps to reduce your exposure to pollen during allergy  seasons.    Do not hang sheets or clothing out to dry; pollen may collect on these items. Do not mow lawns or spend time around freshly cut grass; mowing stirs up pollen. Keep windows closed at night.  Keep car windows closed while driving. Minimize morning activities outdoors, a time when pollen counts are usually at their highest. Stay indoors as much as possible when pollen counts or humidity is high and on windy days when pollen tends to remain in the air longer. Use air conditioning when possible.  Many air conditioners have filters that trap the pollen spores. Use a HEPA room air filter to remove pollen form the indoor air you breathe.  DUST MITE AVOIDANCE MEASURES:  There are three main measures that need and can be taken to avoid house dust mites:  Reduce accumulation of dust in general -reduce furniture, clothing, carpeting, books, stuffed animals, especially in bedroom  Separate yourself from the dust -use  pillow and mattress encasements (can be found at stores such as [employer], [employer] or online) -avoid direct exposure to air condition flow -use a HEPA filter device, especially in the bedroom; you can also use a HEPA filter vacuum cleaner -wipe dust with a moist towel instead of a dry towel or broom  when cleaning  Decrease mites and/or their secretions -wash clothing and linen and stuffed animals at highest temperature possible, at least every 2 weeks -stuffed animals can also be placed in a bag and put in a freezer overnight  Despite the above measures, it is impossible to eliminate dust mites or their allergen completely from your home.  With the above measures the burden of mites in your home can be diminished, with the goal of minimizing your allergic symptoms.  Success will be reached only when implementing and using all means together.  Control of Dog or Cat Allergen  Avoidance is the best way to manage a dog or cat allergy . If you have a dog or cat and are allergic to dog or cats, consider removing the dog or cat from the home. If you have a dog or cat but dont want to find it a new home, or if your family wants a pet even though someone in the household is allergic, here are some strategies that may help keep symptoms at bay:  Keep the pet out of your bedroom and restrict it to only a few rooms. Be advised that keeping the dog or cat in only one room will not limit the allergens to that room. Dont pet, hug or kiss the dog or cat; if you do, wash your hands with soap and water. High-efficiency particulate air (HEPA) cleaners run continuously in a bedroom or living room can reduce allergen levels over time. Regular use of a high-efficiency vacuum cleaner or a central vacuum can reduce allergen levels. Giving your dog or cat a bath at least once a week can reduce airborne allergen.  Control of Cockroach Allergen  Cockroach allergen has been identified as an important cause of acute attacks of asthma, especially in urban settings.  There are fifty-five species of cockroach that exist in the [place] , however only three, the American, German and Oriental species produce allergen that can affect patients with Asthma.  Allergens can be obtained from fecal particles, egg casings and  secretions from cockroaches.    Remove food sources. Reduce access to water. Seal access and entry points. Spray runways with 0.5-1% Diazinon or Chlorpyrifos Blow boric acid power under stoves and refrigerator. Place bait stations (hydramethylnon) at feeding sites.  Other: spacer provided in clinic, reviewed spirometry technique, and reviewed inhaler technique  Thank you so much for letting me partake in your care today.  Don't hesitate to reach out if you have any additional concerns!  Hargis Springer, MD  [facility] [place], [place]        "

## 2024-10-03 NOTE — Patient Instructions (Addendum)
 Chronic Rhinitis Seasonal and Perennial Allergic: Well-controlled  Given preference and well-controlled symptoms we will hold off on restarting allergy  immunotherapy at this time  - Prevention:  - allergen avoidance: Grass, weeds, trees, dust mite, cat, roach  - Symptom control: - Continue Flonase  1 spray per nostril twice daily as needed for nasal congestion, sneezing, runny nose - Continue Antihistamine: daily or daily as needed.   -Options include Zyrtec  (Cetirizine ) 10mg , Claritin (Loratadine) 10mg , Allegra (Fexofenadine) 180mg , or Xyzal  (Levocetirinze) 5mg  - Can be purchased over-the-counter if not covered by insurance.  Allergic Conjunctivitis:  - Continue Allergy  Eye drops-Cromolyn -1 drop each eye up to 4 times daily as needed and Rewetting Drops such as Systane,TheraTears, etc  -Avoid eye drops that say red eye relief as they may contain medications that dry out your eyes.    Oral Allergy  Syndrome: Fresh fruits and vegetables  - Continue avoid raw fruits and vegetables as tolerated   Mild Intermittent Asthma: not well controlled likely due to recent viral infection - your lung testing: showed inflammation in your lungs that is new from prior and significantly changed with albuterol   PLAN:  For next 4 weeks start Flovent  44 mcg 2 puffs twice daily   - Rescue Inhaler: Albuterol  (Proair /Ventolin ) 2 puffs . Use  every 4-6 hours as needed for chest tightness, wheezing, or coughing.  Can also use 15 minutes prior to exercise if you have symptoms with activity. - Asthma is not controlled if:  - Symptoms are occurring >2 times a week OR  - >2 times a month nighttime awakenings  - You are requiring systemic steroids (prednisone /steroid injections) more than once per year  - Your require hospitalization for your asthma.  - Please call the clinic to schedule a follow up if these symptoms arise  Follow up: 6 months   Thank you so much for letting me partake in your care today.   Don't hesitate to reach out if you have any additional concerns!  Hargis Springer, MD  [facility] [place], [place]  Reducing Pollen Exposure  The American Academy of Allergy , Asthma and Immunology suggests the following steps to reduce your exposure to pollen during allergy  seasons.    Do not hang sheets or clothing out to dry; pollen may collect on these items. Do not mow lawns or spend time around freshly cut grass; mowing stirs up pollen. Keep windows closed at night.  Keep car windows closed while driving. Minimize morning activities outdoors, a time when pollen counts are usually at their highest. Stay indoors as much as possible when pollen counts or humidity is high and on windy days when pollen tends to remain in the air longer. Use air conditioning when possible.  Many air conditioners have filters that trap the pollen spores. Use a HEPA room air filter to remove pollen form the indoor air you breathe.  DUST MITE AVOIDANCE MEASURES:  There are three main measures that need and can be taken to avoid house dust mites:  Reduce accumulation of dust in general -reduce furniture, clothing, carpeting, books, stuffed animals, especially in bedroom  Separate yourself from the dust -use pillow and mattress encasements (can be found at stores such as [employer], [employer] or online) -avoid direct exposure to air condition flow -use a HEPA filter device, especially in the bedroom; you can also use a HEPA filter vacuum cleaner -wipe dust with a moist towel instead of a dry towel or broom when cleaning  Decrease mites and/or their  secretions -wash clothing and linen and stuffed animals at highest temperature possible, at least every 2 weeks -stuffed animals can also be placed in a bag and put in a freezer overnight  Despite the above measures, it is impossible to eliminate dust mites or their allergen completely from your home.  With the above measures the burden of mites  in your home can be diminished, with the goal of minimizing your allergic symptoms.  Success will be reached only when implementing and using all means together.  Control of Dog or Cat Allergen  Avoidance is the best way to manage a dog or cat allergy . If you have a dog or cat and are allergic to dog or cats, consider removing the dog or cat from the home. If you have a dog or cat but dont want to find it a new home, or if your family wants a pet even though someone in the household is allergic, here are some strategies that may help keep symptoms at bay:  Keep the pet out of your bedroom and restrict it to only a few rooms. Be advised that keeping the dog or cat in only one room will not limit the allergens to that room. Dont pet, hug or kiss the dog or cat; if you do, wash your hands with soap and water. High-efficiency particulate air (HEPA) cleaners run continuously in a bedroom or living room can reduce allergen levels over time. Regular use of a high-efficiency vacuum cleaner or a central vacuum can reduce allergen levels. Giving your dog or cat a bath at least once a week can reduce airborne allergen.  Control of Cockroach Allergen  Cockroach allergen has been identified as an important cause of acute attacks of asthma, especially in urban settings.  There are fifty-five species of cockroach that exist in the [place] , however only three, the American, German and Oriental species produce allergen that can affect patients with Asthma.  Allergens can be obtained from fecal particles, egg casings and secretions from cockroaches.    Remove food sources. Reduce access to water. Seal access and entry points. Spray runways with 0.5-1% Diazinon or Chlorpyrifos Blow boric acid power under stoves and refrigerator. Place bait stations (hydramethylnon) at feeding sites.

## 2024-10-14 ENCOUNTER — Ambulatory Visit: Payer: MEDICAID | Attending: Family Medicine | Admitting: Occupational Therapy

## 2024-10-14 ENCOUNTER — Encounter: Payer: Self-pay | Admitting: Occupational Therapy

## 2024-10-14 DIAGNOSIS — M6281 Muscle weakness (generalized): Secondary | ICD-10-CM | POA: Insufficient documentation

## 2024-10-14 DIAGNOSIS — R208 Other disturbances of skin sensation: Secondary | ICD-10-CM | POA: Insufficient documentation

## 2024-10-14 DIAGNOSIS — M79641 Pain in right hand: Secondary | ICD-10-CM | POA: Diagnosis present

## 2024-10-14 DIAGNOSIS — M25532 Pain in left wrist: Secondary | ICD-10-CM | POA: Diagnosis present

## 2024-10-14 DIAGNOSIS — M25531 Pain in right wrist: Secondary | ICD-10-CM | POA: Diagnosis present

## 2024-10-14 DIAGNOSIS — M79642 Pain in left hand: Secondary | ICD-10-CM | POA: Insufficient documentation

## 2024-10-14 NOTE — Therapy (Signed)
 " OUTPATIENT OCCUPATIONAL THERAPY ORTHO EVALUATION  Patient Name: Adriana Jackson MRN: [id number] DOB:2001/03/24, 24 y.o., adult Today's Date: 10/14/2024  PCP: Tharon Lung, MD REFERRING PROVIDER: Delores Suzann HERO, MD  END OF SESSION:  OT End of Session - 10/14/24 1012     Visit Number 1    Number of Visits 6    Date for Recertification  11/27/24    Authorization Type [employer] plan - auth required, VL: 27    OT Start Time 0930    OT Stop Time 1010    OT Time Calculation (min) 40 min    Activity Tolerance Patient tolerated treatment well    Behavior During Therapy [facility] for tasks assessed/performed          Past Medical History:  Diagnosis Date   Anal fissure    Anxiety 03/30/2021   Asthma    Bipolar 1 disorder (HCC)    Depression 03/30/2021   Past Surgical History:  Procedure Laterality Date   COLONOSCOPY     Patient Active Problem List   Diagnosis Date Noted   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 Differential includes carpal tunnel syndrome, rheumatoid arthritis, tendonitis, and known fibromyalgia.PMH: fibromyalgia, asthma Transgender female  PRECAUTIONS: None  RED FLAGS: None   WEIGHT BEARING RESTRICTIONS: No  PAIN:  Are you having pain? Bilateral wrists -  fluctuating pain 1-8/10 (Rt worse). Job duties aggravate pain, rest alleviates pain  FALLS: Has patient fallen in last 6 months? No  LIVING ENVIRONMENT: Lives with: lives with their family Lives in: apartment - 3rd floor Has following equipment at home: none  PLOF: Independent, part time conservation officer, nature at [employer], pt likes to draw/paint  PATIENT GOALS: strengthen wrists and decrease pain  NEXT MD VISIT: N/A  OBJECTIVE:  Note: Objective measures were completed at Evaluation unless otherwise noted.  HAND DOMINANCE: Right  ADLs: Overall ADLs: Independent with all BADLS, Pt cleans. Family cooks. Pt does not drive yet, but has learners permit  FUNCTIONAL OUTCOME MEASURES: Quick Dash: 34.10% deficits bilateral hands/wrists  UPPER EXTREMITY ROM:   BUE AROM WNL's including: wrists and hands   UPPER EXTREMITY MMT:   BUE MMT at shoulders and wrists WNL's  MMT Right eval Left eval  Shoulder flexion    Shoulder abduction    Shoulder adduction    Shoulder extension    Shoulder internal rotation    Shoulder external rotation    Middle trapezius    Lower trapezius    Elbow flexion    Elbow extension    Wrist flexion    Wrist extension    Wrist ulnar deviation    Wrist radial deviation    Wrist pronation    Wrist supination    (Blank rows = not tested)  HAND FUNCTION: Grip strength: Right: 48.9 lbs; Left: 44.3 lbs,  Lateral pinch: Right: 16 lbs, Left: 16 lbs, 3 point pinch: Right: 12 lbs, Left: 11 lbs  COORDINATION: 9 Hole Peg test: Right: 21.90 sec; Left: 22.26 sec  SENSATION: Light touch intact, but does report tingling/radiating pain along median n. Distribution (first 3 fingers volarly)  EDEMA: none  COGNITION: Overall cognitive status: Within functional limits for tasks assessed   OBSERVATIONS: transgender female, bilateral wrist pain most likely from job duties, denies acute injuries (falls, MVA, etc)    TREATMENT DATE: 10/14/24                                                                                                                              Discussed O.T. evaluation findings, OT POC/Goals.  Pt also shown different writing aids - pt had most success and best positioning with built up pen using tan foam. Issued tan foam for home use     PATIENT EDUCATION: Education details: see above Person educated: Patient Education method: Explanation Education comprehension: verbalized understanding  HOME EXERCISE PROGRAM: N/A this date    GOALS: Goals reviewed with patient? Yes  SHORT TERM GOALS: Target date: 11/09/24  Pt to verbalize understanding with HEP (Tendon glides, wrist isometrics, light putty and wrist strength prn) Baseline: Not yet addressed/issued Goal status: INITIAL  2.  Pt to verbalize understanding with CTS management  Baseline: Not yet addressed Goal status: INITIAL  3.  Pt to verbalize understanding with task modifications, A/E recommendations, joint protection, and other pain management symptoms (modalities, braces)  Baseline: not yet addressed Goal status: INITIAL     LONG TERM GOALS: Target date: 11/27/24  Pt to demo 10% deficit reduction on Quick Dash  Baseline: 34.10% deficit Goal status: INITIAL  2.  Pt to improve 3 pt pinch by 2 lbs each hand Baseline: Rt = 12, Lt = 11 Goal status: INITIAL  3.  Pt to report overall greater ease and less pain with writing and job duties Baseline: up to 8/10 pain Goal status: INITIAL     ASSESSMENT:  CLINICAL IMPRESSION: Patient is a 24 y.o. adult (nonbinary) who was seen today for occupational therapy evaluation for bilateral wrist pain that began around Oct 2025. No acute injuries/falls reported, most likely brought on by job duties. Hx includes fibromyalgia, asthma. Patient currently presents slightly below baseline level of functioning demonstrating functional deficits and impairments as noted below. Pt would benefit from skilled OT services in the outpatient  setting to work on impairments as noted below to help pt return to PLOF as able.   [name]   PERFORMANCE DEFICITS: in functional skills including IADLs, strength, pain, body mechanics, endurance, decreased knowledge of use of DME, and UE functional use.   IMPAIRMENTS: are limiting patient from IADLs, work, leisure, and social participation.   COMORBIDITIES: may have co-morbidities  that affects occupational performance. Patient will benefit from skilled OT to address above impairments and improve overall function.  MODIFICATION OR ASSISTANCE TO COMPLETE EVALUATION: No modification  of tasks or assist necessary to complete an evaluation.  OT OCCUPATIONAL PROFILE AND HISTORY: Problem focused assessment: Including review of records relating to presenting problem.  CLINICAL DECISION MAKING: Moderate - several treatment options, min-mod task modification necessary  REHAB POTENTIAL: Good  EVALUATION COMPLEXITY: Low      PLAN:  OT FREQUENCY: 1x/week  OT DURATION: 6 weeks  PLANNED INTERVENTIONS: 97535 self care/ADL training, 02889 therapeutic exercise, 97530 therapeutic activity, 97140 manual therapy, 97035 ultrasound, 97018 paraffin, 02960 fluidotherapy, 97010 moist heat, 97010 cryotherapy, 97034 contrast bath, 97760 Orthotic Initial, 97763 Orthotic/Prosthetic subsequent, energy conservation, patient/family education, and DME and/or AE instructions  RECOMMENDED OTHER SERVICES: none at this time  CONSULTED AND AGREED WITH PLAN OF CARE: Patient  PLAN FOR NEXT SESSION: consider fluido, progress towards STG's  For all possible CPT codes, reference the Planned Interventions line above.     Check all conditions that are expected to impact treatment: {Conditions expected to impact treatment:None of these apply   If treatment provided at initial evaluation, no treatment charged due to lack of authorization.        Adriana Jackson, OT 10/14/2024, 10:13 AM   "

## 2024-10-20 ENCOUNTER — Telehealth: Payer: MEDICAID | Admitting: Physician Assistant

## 2024-10-20 ENCOUNTER — Telehealth: Payer: MEDICAID | Admitting: Nurse Practitioner

## 2024-10-20 DIAGNOSIS — R079 Chest pain, unspecified: Secondary | ICD-10-CM

## 2024-10-20 DIAGNOSIS — R6889 Other general symptoms and signs: Secondary | ICD-10-CM

## 2024-10-20 NOTE — Progress Notes (Signed)
" °  Because of chest/abdominal pain and shortness of breath, I feel your condition warrants further evaluation and I recommend that you be seen in a face-to-face visit.   NOTE: There will be NO CHARGE for this E-Visit   If you are having a true medical emergency, please call 911.     For an urgent face to face visit, [facility] has multiple urgent care centers for your convenience.  Click the link below for the full list of locations and hours, walk-in wait times, appointment scheduling options and driving directions:  Urgent Care - [place], [place], [place], [place], [place], [place]  [place]     Your MyChart E-visit questionnaire answers were reviewed by a board certified advanced clinical practitioner to complete your personal care plan based on your specific symptoms.    Thank you for using e-Visits.    "

## 2024-10-20 NOTE — Progress Notes (Signed)
" °  Because of the symptoms of full body/chest pain along with shortness of breath, I feel your condition warrants further evaluation and I recommend that you be seen in a face-to-face visit.   NOTE: There will be NO CHARGE for this E-Visit   If you are having a true medical emergency, please call 911.     For an urgent face to face visit, [facility] has multiple urgent care centers for your convenience.  Click the link below for the full list of locations and hours, walk-in wait times, appointment scheduling options and driving directions:  Urgent Care - [place], [place], [place], [place], [place], [place]  [place]     Your MyChart E-visit questionnaire answers were reviewed by a board certified advanced clinical practitioner to complete your personal care plan based on your specific symptoms.    Thank you for using e-Visits.    "

## 2024-10-22 ENCOUNTER — Ambulatory Visit (HOSPITAL_COMMUNITY)
Admission: RE | Admit: 2024-10-22 | Discharge: 2024-10-22 | Disposition: A | Discharge status: Home / Self Care | Payer: MEDICAID | Source: Ambulatory Visit | Attending: Family Medicine | Admitting: Family Medicine

## 2024-10-22 ENCOUNTER — Encounter (HOSPITAL_COMMUNITY): Payer: Self-pay

## 2024-10-22 VITALS — BP 127/88 | HR 115 | Temp 98.6°F | Resp 16

## 2024-10-22 DIAGNOSIS — N3000 Acute cystitis without hematuria: Secondary | ICD-10-CM | POA: Diagnosis not present

## 2024-10-22 DIAGNOSIS — J069 Acute upper respiratory infection, unspecified: Secondary | ICD-10-CM | POA: Insufficient documentation

## 2024-10-22 LAB — POCT URINE DIPSTICK
Bilirubin, UA: NEGATIVE
Blood, UA: NEGATIVE
Glucose, UA: NEGATIVE mg/dL
Ketones, POC UA: NEGATIVE mg/dL
Nitrite, UA: NEGATIVE
POC PROTEIN,UA: NEGATIVE
Spec Grav, UA: 1.02
Urobilinogen, UA: 0.2 U/dL
pH, UA: 8.5 — AB

## 2024-10-22 MED ORDER — PHENAZOPYRIDINE HCL 200 MG PO TABS: 200.0000 mg | ORAL_TABLET | Freq: Three times a day (TID) | ORAL | 0 refills | Status: AC | PRN

## 2024-10-22 MED ORDER — PROMETHAZINE-DM 6.25-15 MG/5ML PO SYRP: 5.0000 mL | ORAL_SOLUTION | Freq: Every evening | ORAL | 0 refills | Status: AC | PRN

## 2024-10-22 MED ORDER — NITROFURANTOIN MONOHYD MACRO 100 MG PO CAPS: 100.0000 mg | ORAL_CAPSULE | Freq: Two times a day (BID) | ORAL | 0 refills | Status: AC

## 2024-10-22 NOTE — ED Provider Notes (Signed)
 " [facility]    CSN: [id number] Arrival date & time: 10/22/24  1205      History   Chief Complaint Chief Complaint  Patient presents with   Urinary Frequency    Urinary tract infectionFull body aches Chest pain, shortness of breath, sick feeling in chest - Entered by patient   Cough    HPI Adriana Jackson is a 24 y.o. adult.   This 24 year old patient is being seen for complaints of dysuria, urgency, frequency as well as suprapubic pain for 3 days.  They did an at-home UTI test which revealed leukocytes but no nitrites.  They also report fever of 100.6 on Saturday.  They took ibuprofen  and fever resolved, has not recurred.  They also complain of nonproductive cough that causes shortness of breath at times as well as nasal congestion since Saturday.  Denies dizziness, headaches, ear pain, sore throat.  They deny chest pain, abdominal pain, nausea, vomiting, diarrhea.   Urinary Frequency Associated symptoms include shortness of breath. Pertinent negatives include no chest pain, no abdominal pain and no headaches.  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      Home Medications    Prior to Admission medications  Medication Sig Start Date End Date Taking? Authorizing Provider  atomoxetine (STRATTERA) 80 MG capsule Take 80 mg by  mouth every morning. 07/28/22  Yes [provider]  cetirizine  (ZYRTEC ) 10 MG tablet TAKE 1 TABLET BY MOUTH DAILY 09/22/24  Yes Lorin Norris, MD  Cholecalciferol  25 MCG (1000 UT) capsule Take 1 capsule (1,000 Units total) by mouth daily. 06/26/24  Yes Mabe, Elna, MD  fluticasone  (FLONASE ) 50 MCG/ACT nasal spray Place 1 spray into both nostrils daily. 06/01/23  Yes Lorin Norris, MD  fluticasone  (FLOVENT  HFA) 44 MCG/ACT inhaler Inhale 2 puffs into the lungs 2 (two) times daily. 10/03/24  Yes Lorin Norris, MD  Glucosamine HCl (GLUCOSAMINE PO) Take 1 tablet by mouth daily.   Yes [provider]  lamoTRIgine (LAMICTAL) 150 MG tablet Take 150 mg by mouth daily.   Yes [provider]  nitrofurantoin , macrocrystal-monohydrate, (MACROBID ) 100 MG capsule Take 1 capsule (100 mg total) by mouth 2 (two) times daily for 5 days. 10/22/24 10/27/24 Yes Kamani Lewter C, FNP  phenazopyridine  (PYRIDIUM ) 200 MG tablet Take 1 tablet (200 mg total) by mouth every 8 (eight) hours as needed for pain. 10/22/24  Yes Sahra Converse C, FNP  pregabalin  (LYRICA ) 50 MG capsule Take 1 capsule (50 mg total) by mouth at bedtime. 11/09/23  Yes Raulkar, Sven SQUIBB, MD  promethazine -dextromethorphan (PROMETHAZINE -DM) 6.25-15 MG/5ML syrup Take 5 mLs by mouth at bedtime as needed for cough. 10/22/24  Yes Mukesh Kornegay C, FNP  sertraline  (ZOLOFT ) 100 MG tablet Take 150 mg by mouth every  morning. 02/14/22  Yes [provider]  testosterone  cypionate (DEPOTESTOSTERONE CYPIONATE) 200 MG/ML injection INJECT 0.6ML INTO THE SKIN WEEKLY 09/10/24  Yes Mabe, Elna, MD  albuterol  (PROVENTIL ) (2.5 MG/3ML) 0.083% nebulizer solution Take 3 mLs (2.5 mg total) by nebulization every 6 (six) hours as needed for wheezing or shortness of breath. 08/08/24   Vivienne Delon HERO, PA-C  albuterol  (VENTOLIN  HFA) 108 (90 Base) MCG/ACT inhaler Inhale 1-2 puffs into the lungs every 6 (six) hours as needed. 08/08/24   Vivienne Delon HERO, PA-C  B-D 3CC LUER-LOK SYR 18GX1-1/2 18G X 1-1/2 3 ML MISC See admin instructions. 04/04/22   [provider]  BD SYRINGE SLIP TIP 25G X 5/8 1 ML MISC See admin instructions. 07/24/22   [provider]  dicyclomine  (BENTYL ) 20 MG tablet Take 1 tablet (20 mg total) by mouth 4 (four) times daily -  before meals and at bedtime. 01/05/22   Tanda Bleacher, MD  EPINEPHrine  (EPIPEN  2-PAK) 0.3 mg/0.3 mL IJ SOAJ injection Inject 0.3 mg into the muscle as needed for anaphylaxis. 06/01/23   Lorin Norris, MD  Insulin  Syringes, Disposable, U-100 1 ML MISC Use as directed to inject testosterone . 03/13/24   Tharon Elna, MD  NEEDLE, DISP, 18 G (B-D BLUNT FILL NEEDLE) 18G X 1-1/2 MISC Use as directed to draw up testosterone . 03/13/24   Tharon Elna, MD  SURE COMFORT INSULIN  SYRINGE 31G X 5/16 1 ML MISC Use as directed to inject testosterone . 03/13/24   Tharon Elna, MD  Vitamin D , Ergocalciferol , (DRISDOL ) 1.25 MG (50000 UNIT) CAPS capsule Take 1 capsule (50,000 Units total) by mouth every 7 (seven) days. 01/14/24   Raulkar, Sven SQUIBB, MD    Family History Family History  Problem Relation Age of Onset   Asthma Mother    Irritable bowel syndrome Mother    Fibromyalgia Mother    Osteoporosis Mother    Hypothyroidism Mother    Allergic rhinitis Father    Rheum arthritis Father    Hypertension Father    Multiple sclerosis Sister    Colon cancer Neg Hx    Esophageal cancer Neg Hx     Social History Social History[1]   Allergies   Patient has no known allergies.   Review of Systems Review of Systems  Constitutional:  Positive for fever (resolved since Saturday). Negative for chills.  HENT:  Positive for congestion. Negative for ear pain and sore throat.   Respiratory:  Positive for cough and shortness of breath.   Cardiovascular:  Negative for chest pain.  Gastrointestinal:  Negative for abdominal pain, nausea and vomiting.  Genitourinary:  Positive for dysuria,  frequency and urgency. Negative for difficulty urinating, vaginal bleeding, vaginal discharge and vaginal pain.  Skin:  Negative for color change and rash.  Neurological:  Negative for dizziness and headaches.  All other systems reviewed and are negative.    Physical Exam Triage Vital Signs ED Triage Vitals [10/22/24 1255]  Encounter Vitals Group     BP 127/88     Girls Systolic BP Percentile      Girls Diastolic BP Percentile      Boys Systolic BP Percentile      Boys Diastolic BP Percentile      Pulse Rate (!) 115     Resp 16     Temp 98.6 F (37 C)     Temp Source Oral     SpO2 96 %     Weight      Height  Head Circumference      Peak Flow      Pain Score      Pain Loc      Pain Education      Exclude from Growth Chart    No data found.  Updated Vital Signs BP 127/88 (BP Location: Left Arm)   Pulse (!) 115   Temp 98.6 F (37 C) (Oral)   Resp 16   SpO2 96%   Visual Acuity Right Eye Distance:   Left Eye Distance:   Bilateral Distance:    Right Eye Near:   Left Eye Near:    Bilateral Near:     Physical Exam Vitals and nursing note reviewed.  Constitutional:      General: [name] is not in acute distress.    Appearance: [name] is well-developed. [name] is not toxic-appearing.     Comments: Patient appearing stated age found sitting in chair in no acute distress.  HENT:     Right Ear: External ear normal.     Left Ear: External ear normal.     Nose: Congestion present.  Cardiovascular:     Rate and Rhythm: Normal rate and regular rhythm.     Heart sounds: Normal heart sounds. No murmur heard. Pulmonary:     Effort: Pulmonary effort is normal. No respiratory distress.     Breath sounds: Normal breath sounds.  Abdominal:     General: Bowel sounds are normal.     Palpations: Abdomen is soft.     Tenderness: There is abdominal tenderness in the suprapubic area.  Skin:    General: Skin is warm and dry.     Capillary Refill: Capillary refill takes less  than 2 seconds.  Neurological:     Mental Status: [name] is alert.  Psychiatric:        Mood and Affect: Mood normal.      UC Treatments / Results  Labs (all labs ordered are listed, but only abnormal results are displayed) Labs Reviewed  POCT URINE DIPSTICK - Abnormal; Notable for the following components:      Result Value   Clarity, UA hazy (*)    pH, UA 8.5 (*)    Leukocytes, UA Small (1+) (*)    All other components within normal limits  URINE CULTURE    EKG   Radiology No results found.  Procedures Procedures (including critical care time)  Medications Ordered in UC Medications - No data to display  Initial Impression / Assessment and Plan / UC Course  I have reviewed the triage vital signs and the nursing notes.  Pertinent labs & imaging results that were available during my care of the patient were reviewed by me and considered in my medical decision making (see chart for details).     Vitals and triage reviewed, patient is hemodynamically stable.  Patient presents with complaints of dysuria for 3 days.  UA significant for hazy clarity, 8.5 pH, small leukocytes.  Patient is given prescription for nitrofurantoin , phenazopyridine .  Advised increase fluid intake.  Patient also complains of nonproductive cough and nasal congestion consistent with respiratory illness.  They are given prescription for promethazine -DM.  Advised supportive care with Tylenol  and/or ibuprofen , guaifenesin, saline nasal spray.  Plan of care, follow-up care, return precautions given, no questions at this time. Final Clinical Impressions(s) / UC Diagnoses   Final diagnoses:  Acute cystitis without hematuria  Upper respiratory tract infection, unspecified type     Discharge Instructions      Your urine  shows you likely have a urinary tract infection.   I have sent your urine for culture to confirm this.   We will call you if we need to change your antibiotic when we find out the type  of bacteria growing in your bladder.  Take nitrofurantoin  (antibiotic) as directed with a snack/food to avoid stomach upset. Take phenazopyridine  every 8 hours as needed for bladder pain, spasms.  Avoid drinking beverages that irritate the urinary tract like sodas, tea, coffee, or juice. Drink plenty of water to stay well hydrated and prevent severe infection.  If you develop pain in your upper back on one side, fever despite taking antibiotic, nausea and vomiting where you cannot keep anything down for 24 hours, dizziness/severe headache, or decreased urinary output, please go to the ER. Follow-up with PCP.     You also have a viral illness which will improve on its own with rest, fluids, and medications to help with your symptoms.  Tylenol  and/or ibuprofen  for fever, pain, body aches. Guaifenesin (plain mucinex) for cough, congestion. Saline nasal sprays for nasal congestion.  Humidifier in room at nighttime may help soothe cough (clean well daily).   Take Promethazine  DM cough medication to help with your cough at nighttime so that you are able to sleep. Do not drive, drink alcohol, or go to work while taking this medication since it can make you sleepy. Only take this at nighttime.   For chest pain, shortness of breath, inability to keep food or fluids down without vomiting, fever that does not respond to tylenol  or motrin , or any other severe symptoms, please go to the ER for further evaluation. Return to urgent care as needed, otherwise follow-up with PCP.       ED Prescriptions     Medication Sig Dispense Auth. Provider   nitrofurantoin , macrocrystal-monohydrate, (MACROBID ) 100 MG capsule Take 1 capsule (100 mg total) by mouth 2 (two) times daily for 5 days. 10 capsule Lyza Houseworth C, FNP   phenazopyridine  (PYRIDIUM ) 200 MG tablet Take 1 tablet (200 mg total) by mouth every 8 (eight) hours as needed for pain. 6 tablet Sederick Jacobsen C, FNP   promethazine -dextromethorphan  (PROMETHAZINE -DM) 6.25-15 MG/5ML syrup Take 5 mLs by mouth at bedtime as needed for cough. 118 mL Sahvannah Rieser C, FNP      PDMP not reviewed this encounter.    [1]  Social History Tobacco Use   Smoking status: Never    Passive exposure: Past   Smokeless tobacco: Never  Vaping Use   Vaping status: Never Used  Substance Use Topics   Alcohol use: Not Currently   Drug use: Not Currently    Types: Marijuana     Lennice Jon BROCKS, FNP 10/22/24 1353  "

## 2024-10-22 NOTE — ED Triage Notes (Signed)
 Pt presents to the office for dysuria x 3 days. Patient states she had a fever of 100.6.

## 2024-10-22 NOTE — Discharge Instructions (Signed)
 Your urine shows you likely have a urinary tract infection.   I have sent your urine for culture to confirm this.   We will call you if we need to change your antibiotic when we find out the type of bacteria growing in your bladder.  Take nitrofurantoin  (antibiotic) as directed with a snack/food to avoid stomach upset. Take phenazopyridine  every 8 hours as needed for bladder pain, spasms.  Avoid drinking beverages that irritate the urinary tract like sodas, tea, coffee, or juice. Drink plenty of water to stay well hydrated and prevent severe infection.  If you develop pain in your upper back on one side, fever despite taking antibiotic, nausea and vomiting where you cannot keep anything down for 24 hours, dizziness/severe headache, or decreased urinary output, please go to the ER. Follow-up with PCP.     You also have a viral illness which will improve on its own with rest, fluids, and medications to help with your symptoms.  Tylenol  and/or ibuprofen  for fever, pain, body aches. Guaifenesin (plain mucinex) for cough, congestion. Saline nasal sprays for nasal congestion.  Humidifier in room at nighttime may help soothe cough (clean well daily).   Take Promethazine  DM cough medication to help with your cough at nighttime so that you are able to sleep. Do not drive, drink alcohol, or go to work while taking this medication since it can make you sleepy. Only take this at nighttime.   For chest pain, shortness of breath, inability to keep food or fluids down without vomiting, fever that does not respond to tylenol  or motrin , or any other severe symptoms, please go to the ER for further evaluation. Return to urgent care as needed, otherwise follow-up with PCP.

## 2024-10-23 ENCOUNTER — Ambulatory Visit: Payer: MEDICAID | Admitting: Occupational Therapy

## 2024-10-23 LAB — URINE CULTURE: Culture: 10000 — AB

## 2024-10-28 ENCOUNTER — Encounter: Payer: Self-pay | Admitting: Occupational Therapy

## 2024-10-28 ENCOUNTER — Ambulatory Visit: Payer: MEDICAID | Admitting: Occupational Therapy

## 2024-10-28 DIAGNOSIS — M25531 Pain in right wrist: Secondary | ICD-10-CM

## 2024-10-28 DIAGNOSIS — M6281 Muscle weakness (generalized): Secondary | ICD-10-CM

## 2024-10-28 DIAGNOSIS — R208 Other disturbances of skin sensation: Secondary | ICD-10-CM

## 2024-10-28 DIAGNOSIS — M79641 Pain in right hand: Secondary | ICD-10-CM

## 2024-10-28 NOTE — Therapy (Signed)
 " OUTPATIENT OCCUPATIONAL THERAPY ORTHO TREATMENT  Patient Name: Adriana Jackson MRN: [id number] DOB:[birth year], 24 y.o., adult Today's Date: 10/28/2024  PCP: Tharon Lung, MD REFERRING PROVIDER: Delores Suzann HERO, MD  END OF SESSION:  OT End of Session - 10/28/24 0851     Visit Number 2    Number of Visits 6    Date for Recertification  11/27/24    Authorization Type [employer] plan - auth required, VL: 27    OT Start Time 0846    OT Stop Time 0930    OT Time Calculation (min) 44 min    Activity Tolerance Patient tolerated treatment well    Behavior During Therapy [facility] for tasks assessed/performed          Past Medical History:  Diagnosis Date   Anal fissure    Anxiety 03/30/2021   Asthma    Bipolar 1 disorder (HCC)    Depression 03/30/2021   Past Surgical History:  Procedure Laterality Date   COLONOSCOPY     Patient Active Problem List   Diagnosis Date Noted   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 Differential includes carpal tunnel syndrome, rheumatoid arthritis, tendonitis, and known fibromyalgia.PMH: fibromyalgia, asthma Transgender female  PRECAUTIONS: None  RED FLAGS: None   WEIGHT BEARING RESTRICTIONS: No  PAIN:  Are you having pain? Bilateral wrists  - fluctuating pain 1-8/10 (Rt worse). Job duties aggravate pain, rest alleviates pain  FALLS: Has patient fallen in last 6 months? No  LIVING ENVIRONMENT: Lives with: lives with their family Lives in: apartment - 3rd floor Has following equipment at home: none  PLOF: Independent, part time conservation officer, nature at [employer], pt likes to draw/paint  PATIENT GOALS: strengthen wrists and decrease pain  NEXT MD VISIT: N/A  OBJECTIVE:  Note: Objective measures were completed at Evaluation unless otherwise noted.  HAND DOMINANCE: Right  ADLs: Overall ADLs: Independent with all BADLS, Pt cleans. Family cooks. Pt does not drive yet, but has learners permit  FUNCTIONAL OUTCOME MEASURES: Quick Dash: 34.10% deficits bilateral hands/wrists  UPPER EXTREMITY ROM:   BUE AROM WNL's including: wrists and hands   UPPER EXTREMITY MMT:   BUE MMT at shoulders and wrists WNL's  MMT Right eval Left eval  Shoulder flexion    Shoulder abduction    Shoulder adduction    Shoulder extension    Shoulder internal rotation    Shoulder external rotation    Middle trapezius    Lower trapezius    Elbow flexion    Elbow extension    Wrist flexion    Wrist extension    Wrist ulnar deviation    Wrist radial deviation    Wrist pronation    Wrist supination    (Blank rows = not tested)  HAND FUNCTION: Grip strength: Right: 48.9 lbs; Left: 44.3 lbs,  Lateral pinch: Right: 16 lbs, Left: 16 lbs, 3 point pinch: Right: 12 lbs, Left: 11 lbs  COORDINATION: 9 Hole Peg test: Right: 21.90 sec; Left: 22.26 sec  SENSATION: Light touch intact, but does report tingling/radiating pain along median n. Distribution (first 3 fingers volarly)  EDEMA: none  COGNITION: Overall cognitive status: Within functional limits for tasks assessed   OBSERVATIONS: transgender female, bilateral wrist pain most likely from job duties, denies acute injuries (falls, MVA, etc)    TREATMENT DATE: 10/28/24                                                                                                                              Fluidotherapy x 10 minutes for Rt wrist/hand to address pain, swelling, and stiffness. No adverse reactions. Pt shown tendon glides to perform while in fluidotherapy  Pt issued HEP for lower median n gliding ex's, tendon glides and wrist isometrics and reviewed each - pt instructed to stop any if pain increases. See pt instructions for details Pt also issued CTS conservative management HEP and reviewed - not in EPIC  Pt educated in wrist braces and recommended wearing prn during aggravating activities, especially work if allowed (pt has one with metal stay for Rt hand). Also showed patient neoprene wrist brace for Lt hand if needed.   PATIENT EDUCATION: Education details: see above Person educated: Patient Education method: Programmer, Multimedia, Demonstration, Verbal cues, and Handouts Education comprehension: verbalized understanding, returned demonstration, and verbal cues required  HOME EXERCISE PROGRAM: 10/28/24: median n glides, tendon glides, wrist isometrics, CTS conservative management handout   GOALS: Goals reviewed with patient? Yes  SHORT TERM GOALS: Target date: 11/09/24  Pt to verbalize understanding with HEP (Tendon glides, wrist isometrics, light putty and wrist strength prn) Baseline: Not yet addressed/issued Goal status: IN PROGRESS  2.  Pt to verbalize understanding with CTS management  Baseline: Not yet addressed Goal status: IN PROGRESS  3.  Pt to verbalize understanding with task modifications, A/E recommendations, joint protection, and other pain management symptoms (modalities, braces)  Baseline: not yet addressed Goal status: INITIAL     LONG TERM GOALS: Target date: 11/27/24  Pt to demo 10% deficit reduction on Quick Dash  Baseline: 34.10% deficit Goal status: INITIAL  2.  Pt to improve 3 pt pinch by 2 lbs each hand Baseline: Rt = 12, Lt = 11 Goal status: INITIAL  3.   Pt to report overall greater ease and less pain with writing and job duties Baseline: up to 8/10 pain Goal status: INITIAL     ASSESSMENT:  CLINICAL IMPRESSION: Patient is a 24 y.o. adult (nonbinary) who was seen today for occupational therapy treatment for bilateral wrist pain that began around Oct 2025. No acute injuries/falls reported, most likely brought on by job duties. Hx includes fibromyalgia, asthma. Patient tolerated HEP's well today and responds well to education. Pt would benefit from skilled OT services in the outpatient setting to work on impairments  as noted below to help pt return to PLOF as able.   [name]   PERFORMANCE DEFICITS: in functional skills including IADLs, strength, pain, body mechanics, endurance, decreased knowledge of use of DME, and UE functional use.   IMPAIRMENTS: are limiting patient from IADLs, work, leisure, and social participation.   COMORBIDITIES: may have co-morbidities  that affects occupational performance. Patient will benefit from skilled OT to address above impairments and improve overall function.  MODIFICATION OR ASSISTANCE TO COMPLETE EVALUATION: No modification of tasks or assist necessary to complete an evaluation.  OT OCCUPATIONAL PROFILE AND HISTORY: Problem focused assessment: Including review of records relating to presenting problem.  CLINICAL DECISION MAKING: Moderate - several treatment options, min-mod task modification necessary  REHAB POTENTIAL: Good  EVALUATION COMPLEXITY: Low      PLAN:  OT FREQUENCY: 1x/week  OT DURATION: 6 weeks  PLANNED INTERVENTIONS: 97535 self care/ADL training, 02889 therapeutic exercise, 97530 therapeutic activity, 97140 manual therapy, 97035 ultrasound, 97018 paraffin, 02960 fluidotherapy, 97010 moist heat, 97010 cryotherapy, 97034 contrast bath, 97760 Orthotic Initial, 97763 Orthotic/Prosthetic subsequent, energy conservation, patient/family education, and DME and/or AE  instructions  RECOMMENDED OTHER SERVICES: none at this time  CONSULTED AND AGREED WITH PLAN OF CARE: Patient  PLAN FOR NEXT SESSION: continue fluido, joint protection handout and A/E recommendations, consider K-taping?   For all possible CPT codes, reference the Planned Interventions line above.     Check all conditions that are expected to impact treatment: {Conditions expected to impact treatment:None of these apply   If treatment provided at initial evaluation, no treatment charged due to lack of authorization.        Burnard JINNY Roads, OT 10/28/2024, 8:51 AM   "

## 2024-10-28 NOTE — Patient Instructions (Addendum)
" ° ° ° °  °  Wrist Extension: Isometric    With Rt forearm resting palm down on thigh, resist upward movement of hand with other hand. Hold _10___ seconds. Relax. Repeat __5__ times per set.  Do __2__ sessions per day.  Flexion (Isometric)    With forearm held steady, palm up, use other hand to resist upward movement of hand at wrist. Hold __10__ seconds. Relax. Repeat __5__ times. Do __2__ sessions per day. STOP if pain increases   Wrist Radial Deviation: Isometric    With Rt forearm resting on thigh, thumb up, use other hand to resist upward movement of hand at wrist. Hold __10__ seconds. Relax. Repeat __5__ times per set.  Do __2__ sessions per day.   Wrist Ulnar Deviation: Isometric    With Rt forearm resting on thigh, thumb up, use other hand to resist downward movement of hand at wrist. Hold __10__ seconds. Relax. Repeat __5__ times per set.  Do __2__ sessions per day.      "

## 2024-10-29 ENCOUNTER — Other Ambulatory Visit: Payer: Self-pay | Admitting: Family Medicine

## 2024-10-30 ENCOUNTER — Encounter: Payer: Self-pay | Admitting: Family Medicine

## 2024-10-31 ENCOUNTER — Ambulatory Visit: Payer: MEDICAID

## 2024-10-31 VITALS — BP 115/79 | HR 95 | Temp 98.9°F | Ht 67.0 in | Wt 138.8 lb

## 2024-10-31 DIAGNOSIS — J4540 Moderate persistent asthma, uncomplicated: Secondary | ICD-10-CM

## 2024-10-31 DIAGNOSIS — J45909 Unspecified asthma, uncomplicated: Secondary | ICD-10-CM | POA: Insufficient documentation

## 2024-10-31 MED ORDER — PREDNISONE 50 MG PO TABS: 50.0000 mg | ORAL_TABLET | Freq: Every day | ORAL | 0 refills | Status: AC

## 2024-10-31 NOTE — Patient Instructions (Signed)
 I have prescribed a 5 day course of steroids.   Return if no improvement after 1-2 weeks.

## 2024-10-31 NOTE — Assessment & Plan Note (Signed)
 Subacute cough in the setting of asthma without response to inhalers, possibly post-viral however with patient reporting wheezing will treat with steroids.  Prescribe 50 mg prednisone  for 5 days  Return precautions discussed.

## 2024-10-31 NOTE — Progress Notes (Signed)
" ° ° °  SUBJECTIVE:   CHIEF COMPLAINT / HPI:   Adriana Jackson is a 24 y.o. nonbinary  transgender female presenting for subacute cough with history of asthma.   Symptoms started 1/24 with fever to 100.6 and cough. Since then they have continued to cough and inhalers at home have not helped. No reported fevers after initial onset. They use Flovent  daily and albuterol  as needed every 4-6 hours without relief. Can hear an audible wheeze.   PERTINENT  PMH / PSH: Reviewed and updated   OBJECTIVE:   BP 115/79   Pulse 95   Temp 98.9 F (37.2 C)   Ht 5' 7 (1.702 m)   Wt 138 lb 12.8 oz (63 kg)   SpO2 100%   BMI 21.74 kg/m   Well-appearing, no acute distress HEENT: erythematous nasal turbinates, clear TMs bilaterally, mild cervical lymphadenopathy bilaterally. Mild maxillary sinus tenderness Cardio: Regular rate, regular rhythm, no murmurs on exam. <2 sec capillary refill  Pulm: Clear, no wheezing, no crackles. No increased work of breathing Abdominal: bowel sounds present, soft, non-tender, non-distended  ASSESSMENT/PLAN:   Assessment & Plan Moderate persistent asthma without complication Subacute cough in the setting of asthma without response to inhalers, possibly post-viral however with patient reporting wheezing will treat with steroids.  Prescribe 50 mg prednisone  for 5 days  Return precautions discussed.    Damien Pinal, DO [facility]  "

## 2024-11-05 ENCOUNTER — Ambulatory Visit: Payer: MEDICAID | Admitting: Occupational Therapy

## 2024-11-11 ENCOUNTER — Ambulatory Visit: Payer: MEDICAID | Admitting: Occupational Therapy

## 2025-04-03 ENCOUNTER — Ambulatory Visit: Payer: MEDICAID | Admitting: Internal Medicine
